# Patient Record
Sex: Female | Born: 1960
Health system: Southern US, Community
[De-identification: ages and names within clinical notes are randomized; demographics above are authoritative.]

## PROBLEM LIST (undated history)

## (undated) DIAGNOSIS — E119 Type 2 diabetes mellitus without complications: Secondary | ICD-10-CM

## (undated) DIAGNOSIS — I10 Essential (primary) hypertension: Secondary | ICD-10-CM

## (undated) DIAGNOSIS — E785 Hyperlipidemia, unspecified: Secondary | ICD-10-CM

## (undated) DIAGNOSIS — Z720 Tobacco use: Secondary | ICD-10-CM

## (undated) DIAGNOSIS — I2510 Atherosclerotic heart disease of native coronary artery without angina pectoris: Secondary | ICD-10-CM

## (undated) DIAGNOSIS — I252 Old myocardial infarction: Secondary | ICD-10-CM

## (undated) HISTORY — DX: Hyperlipidemia, unspecified: E78.5

## (undated) HISTORY — DX: Essential (primary) hypertension: I10

## (undated) HISTORY — DX: Type 2 diabetes mellitus without complications: E11.9

## (undated) HISTORY — PX: ABDOMINAL HYSTERECTOMY: SHX81

## (undated) HISTORY — PX: HERNIA REPAIR: SHX51

## (undated) HISTORY — DX: Tobacco use: Z72.0

---
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
## 2000-07-09 ENCOUNTER — Encounter: Payer: Self-pay | Admitting: Obstetrics

## 2000-07-09 ENCOUNTER — Inpatient Hospital Stay (HOSPITAL_COMMUNITY): Admission: AD | Admit: 2000-07-09 | Discharge: 2000-07-09 | Payer: Self-pay | Admitting: Obstetrics

## 2003-03-13 ENCOUNTER — Emergency Department (HOSPITAL_COMMUNITY): Admission: EM | Admit: 2003-03-13 | Discharge: 2003-03-13 | Payer: Self-pay | Admitting: *Deleted

## 2003-03-13 ENCOUNTER — Encounter: Payer: Self-pay | Admitting: *Deleted

## 2003-09-10 ENCOUNTER — Emergency Department (HOSPITAL_COMMUNITY): Admission: EM | Admit: 2003-09-10 | Discharge: 2003-09-10 | Payer: Self-pay | Admitting: Emergency Medicine

## 2004-03-09 ENCOUNTER — Emergency Department (HOSPITAL_COMMUNITY): Admission: EM | Admit: 2004-03-09 | Discharge: 2004-03-09 | Payer: Self-pay | Admitting: Family Medicine

## 2005-01-15 ENCOUNTER — Ambulatory Visit: Payer: Self-pay | Admitting: Cardiology

## 2005-01-15 ENCOUNTER — Observation Stay (HOSPITAL_COMMUNITY): Admission: EM | Admit: 2005-01-15 | Discharge: 2005-01-16 | Payer: Self-pay | Admitting: Emergency Medicine

## 2005-03-30 ENCOUNTER — Encounter: Payer: Self-pay | Admitting: Family Medicine

## 2005-03-30 ENCOUNTER — Ambulatory Visit: Payer: Self-pay | Admitting: Family Medicine

## 2005-04-08 ENCOUNTER — Encounter: Admission: RE | Admit: 2005-04-08 | Discharge: 2005-04-08 | Payer: Self-pay | Admitting: Family Medicine

## 2005-04-27 ENCOUNTER — Ambulatory Visit: Payer: Self-pay | Admitting: Family Medicine

## 2005-06-01 ENCOUNTER — Ambulatory Visit: Payer: Self-pay | Admitting: Family Medicine

## 2005-06-22 ENCOUNTER — Ambulatory Visit: Payer: Self-pay | Admitting: Family Medicine

## 2006-09-12 ENCOUNTER — Encounter (INDEPENDENT_AMBULATORY_CARE_PROVIDER_SITE_OTHER): Payer: Self-pay | Admitting: *Deleted

## 2006-09-12 LAB — CONVERTED CEMR LAB

## 2006-09-27 ENCOUNTER — Encounter: Payer: Self-pay | Admitting: Family Medicine

## 2006-09-27 ENCOUNTER — Ambulatory Visit: Payer: Self-pay | Admitting: Sports Medicine

## 2006-10-23 ENCOUNTER — Emergency Department (HOSPITAL_COMMUNITY): Admission: EM | Admit: 2006-10-23 | Discharge: 2006-10-24 | Payer: Self-pay | Admitting: Emergency Medicine

## 2006-11-01 ENCOUNTER — Ambulatory Visit: Payer: Self-pay | Admitting: Family Medicine

## 2007-02-09 DIAGNOSIS — Z720 Tobacco use: Secondary | ICD-10-CM | POA: Insufficient documentation

## 2007-02-09 DIAGNOSIS — F17200 Nicotine dependence, unspecified, uncomplicated: Secondary | ICD-10-CM | POA: Insufficient documentation

## 2007-02-09 DIAGNOSIS — L309 Dermatitis, unspecified: Secondary | ICD-10-CM | POA: Insufficient documentation

## 2007-02-09 DIAGNOSIS — N920 Excessive and frequent menstruation with regular cycle: Secondary | ICD-10-CM | POA: Insufficient documentation

## 2007-02-10 ENCOUNTER — Encounter (INDEPENDENT_AMBULATORY_CARE_PROVIDER_SITE_OTHER): Payer: Self-pay | Admitting: *Deleted

## 2007-10-31 ENCOUNTER — Encounter: Payer: Self-pay | Admitting: Family Medicine

## 2007-10-31 ENCOUNTER — Ambulatory Visit: Payer: Self-pay | Admitting: Family Medicine

## 2007-10-31 ENCOUNTER — Encounter (INDEPENDENT_AMBULATORY_CARE_PROVIDER_SITE_OTHER): Payer: Self-pay | Admitting: *Deleted

## 2007-10-31 LAB — CONVERTED CEMR LAB
ALT: <8 units/L (ref 0–35)
Albumin: 3.9 g/dL (ref 3.5–5.2)
Chloride: 109 meq/L (ref 96–112)
Cholesterol: 183 mg/dL (ref 0–200)
Hemoglobin: 12.3 g/dL (ref 12.0–15.0)
MCHC: 30.6 g/dL (ref 30.0–36.0)
Potassium: 4.1 meq/L (ref 3.5–5.3)
RBC: 4.74 M/uL (ref 3.87–5.11)
RDW: 14.6 % (ref 11.5–15.5)
Total Bilirubin: 0.3 mg/dL (ref 0.3–1.2)
Total Protein: 7.4 g/dL (ref 6.0–8.3)
VLDL: 25 mg/dL (ref 0–40)

## 2007-11-14 ENCOUNTER — Encounter: Payer: Self-pay | Admitting: Family Medicine

## 2007-11-14 ENCOUNTER — Ambulatory Visit (HOSPITAL_COMMUNITY): Admission: RE | Admit: 2007-11-14 | Discharge: 2007-11-14 | Payer: Self-pay | Admitting: *Deleted

## 2007-11-16 ENCOUNTER — Encounter (INDEPENDENT_AMBULATORY_CARE_PROVIDER_SITE_OTHER): Payer: Self-pay | Admitting: *Deleted

## 2009-04-24 ENCOUNTER — Ambulatory Visit: Payer: Self-pay | Admitting: Family Medicine

## 2009-04-24 ENCOUNTER — Ambulatory Visit (HOSPITAL_COMMUNITY): Admission: RE | Admit: 2009-04-24 | Discharge: 2009-04-24 | Payer: Self-pay | Admitting: Family Medicine

## 2009-04-24 ENCOUNTER — Encounter: Payer: Self-pay | Admitting: Family Medicine

## 2009-04-25 LAB — CONVERTED CEMR LAB
HCT: 36.8 % (ref 36.0–46.0)
Hemoglobin: 11.9 g/dL — ABNORMAL LOW (ref 12.0–15.0)
MCHC: 32.3 g/dL (ref 30.0–36.0)
RDW: 14.9 % (ref 11.5–15.5)
WBC: 7.5 10*3/uL (ref 4.0–10.5)

## 2009-04-29 ENCOUNTER — Encounter (INDEPENDENT_AMBULATORY_CARE_PROVIDER_SITE_OTHER): Payer: Self-pay | Admitting: Family Medicine

## 2009-04-29 ENCOUNTER — Ambulatory Visit: Payer: Self-pay | Admitting: Family Medicine

## 2009-04-29 DIAGNOSIS — I10 Essential (primary) hypertension: Secondary | ICD-10-CM | POA: Insufficient documentation

## 2009-04-29 DIAGNOSIS — E669 Obesity, unspecified: Secondary | ICD-10-CM | POA: Insufficient documentation

## 2009-04-29 LAB — CONVERTED CEMR LAB
AST: 13 units/L (ref 0–37)
Alkaline Phosphatase: 69 units/L (ref 39–117)
BUN: 9 mg/dL (ref 6–23)
Creatinine, Ser: 0.82 mg/dL (ref 0.40–1.20)
HDL: 43 mg/dL (ref >39)
LDL Cholesterol: 107 mg/dL — ABNORMAL HIGH (ref 0–99)
Potassium: 4.7 meq/L (ref 3.5–5.3)
Sodium: 139 meq/L (ref 135–145)
TSH: 1.595 microintl units/mL (ref 0.350–4.500)
Total CHOL/HDL Ratio: 4.2

## 2009-04-30 ENCOUNTER — Encounter (INDEPENDENT_AMBULATORY_CARE_PROVIDER_SITE_OTHER): Payer: Self-pay | Admitting: Family Medicine

## 2009-05-08 ENCOUNTER — Encounter: Payer: Self-pay | Admitting: Family Medicine

## 2009-11-11 ENCOUNTER — Ambulatory Visit: Payer: Self-pay | Admitting: Family Medicine

## 2009-11-11 DIAGNOSIS — D259 Leiomyoma of uterus, unspecified: Secondary | ICD-10-CM | POA: Insufficient documentation

## 2010-04-29 ENCOUNTER — Ambulatory Visit: Payer: Self-pay | Admitting: Family Medicine

## 2010-04-29 DIAGNOSIS — M549 Dorsalgia, unspecified: Secondary | ICD-10-CM | POA: Insufficient documentation

## 2010-05-04 ENCOUNTER — Emergency Department (HOSPITAL_COMMUNITY): Admission: EM | Admit: 2010-05-04 | Discharge: 2010-05-04 | Payer: Self-pay | Admitting: Emergency Medicine

## 2010-05-04 LAB — CONVERTED CEMR LAB
Leukocytes, UA: NEGATIVE
Nitrite: NEGATIVE
Protein, ur: NEGATIVE mg/dL
Urobilinogen, UA: 0.2
pH: 7

## 2010-06-08 ENCOUNTER — Ambulatory Visit: Payer: Self-pay | Admitting: Family Medicine

## 2010-06-08 LAB — CONVERTED CEMR LAB
Calcium: 9 mg/dL (ref 8.4–10.5)
Chloride: 108 meq/L (ref 96–112)
Creatinine, Ser: 0.82 mg/dL (ref 0.40–1.20)
Glucose, Bld: 80 mg/dL (ref 70–99)
Pap Smear: NEGATIVE
Triglycerides: 108 mg/dL (ref <150)

## 2010-06-11 ENCOUNTER — Encounter: Payer: Self-pay | Admitting: Family Medicine

## 2010-08-06 ENCOUNTER — Ambulatory Visit: Payer: Self-pay | Admitting: Family Medicine

## 2010-09-15 ENCOUNTER — Ambulatory Visit: Payer: Self-pay | Admitting: Family Medicine

## 2010-10-26 ENCOUNTER — Encounter: Payer: Self-pay | Admitting: Family Medicine

## 2010-12-21 ENCOUNTER — Telehealth: Payer: Self-pay | Admitting: *Deleted

## 2010-12-21 ENCOUNTER — Ambulatory Visit: Admit: 2010-12-21 | Payer: Self-pay | Admitting: Family Medicine

## 2010-12-30 ENCOUNTER — Ambulatory Visit: Admission: RE | Admit: 2010-12-30 | Discharge: 2010-12-30 | Payer: Self-pay | Source: Home / Self Care

## 2010-12-30 ENCOUNTER — Telehealth: Payer: Self-pay | Admitting: Family Medicine

## 2010-12-30 DIAGNOSIS — R109 Unspecified abdominal pain: Secondary | ICD-10-CM | POA: Insufficient documentation

## 2011-01-12 NOTE — Assessment & Plan Note (Signed)
Summary: f/u eo   Vital Signs:  Patient profile:   50 year old female Height:      66.25 inches Weight:      206.8 pounds BMI:     33.25 Temp:     98.0 degrees F oral Pulse rate:   87 / minute BP sitting:   122 / 76  (left arm) Cuff size:   regular  Vitals Entered By: Garen Grams LPN (August 06, 2010 3:59 PM) CC: f/u labs Is Patient Diabetic? No Pain Assessment Patient in pain? yes     Location: lower back   Primary Care Provider:  Tinnie Gens MD  CC:  f/u labs.  History of Present Illness: Here today for follow-up.  Complains of back pain.  Reports it is mostly in her right back, under ribs.  Radiates to flank and abdomen.  Worse if she is lying on it or moves suddenly.  She is, otherwise, doing well.  She continues to have heavy cycles.  Her last endometrial biopsy result and ultrasound is reviewed with her.  Her ultrasound is from 2008.  Her fibroid uterus has grown since then.   Habits & Providers  Alcohol-Tobacco-Diet     Tobacco Status: current     Tobacco Counseling: to quit use of tobacco products  Current Problems (verified): 1)  Back Pain  (ICD-724.5) 2)  Intertrigo, Candidal  (ICD-695.89) 3)  Obesity, Unspecified  (ICD-278.00) 4)  Hypertension  (ICD-401.9) 5)  Numbness, Arm  (ICD-782.0) 6)  Well Woman  (ICD-V70.0) 7)  Tobacco Dependence  (ICD-305.1) 8)  Menorrhagia  (ICD-626.2) 9)  Fibroids, Uterus  (ICD-218.9) 10)  Eczema, Atopic Dermatitis  (ICD-691.8)  Current Medications (verified): 1)  Baby Aspirin 81 Mg Chew (Aspirin) .... One Daily 2)  Nystatin 100000 Unit/gm Crea (Nystatin) .... Apply To Affected Areas Three Times A Day Until Rash Improved. Dispense One Tube. 3)  Triamcinolone Acetonide 0.1 % Lotn (Triamcinolone Acetonide) .... Apply To Affected Areas Sparingly Two-Three Times A Day  Allergies (verified): No Known Drug Allergies  Past History:  Past medical, surgical, family and social histories (including risk factors) reviewed, and no  changes noted (except as noted below).  Past Medical History: Reviewed history from 02/09/2007 and no changes required. Fibroid uterus  Past Surgical History: Reviewed history from 04/29/2009 and no changes required. 09/27/06 ldl: 104 hdl: 43 tri: 119 - 10/19/2006,  cesearean section - 1986 Tonsillectomy at age 27yrs hernia repair at c/s scar   Family History: Reviewed history from 04/29/2009 and no changes required. No history of heart attack or stroke per the patient. Mother has HTN Father had DM and died from some type of cancer in his 80s (unknown) one healthy sister other sister died of HIV  Social History: Reviewed history from 04/29/2009 and no changes required. 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 Has a boyfriend. Occasional alcohol but no illicits.   Physical Exam  General:  alert, well-developed, and well-nourished.   Head:  normocephalic and atraumatic.   Neck:  supple.   Abdomen:  soft, non-tender, and normal bowel sounds, large firm mass 2cm above umbilicus and filling entire lower abdomen.     Impression & Recommendations:  Problem # 1:  BACK PAIN (ICD-724.5)  Her updated medication list for this problem includes:    Baby Aspirin 81 Mg Chew (Aspirin) ..... One daily  Orders: [facility]- Est Level  3 (13244)  Problem # 2:  MENORRHAGIA (ICD-626.2)  Orders: [facility]- Est Level  3 (  34742)  Problem # 3:  FIBROIDS, UTERUS (ICD-218.9) To see me in [facility] office for discussion of management options of fibroids.  Appt. 09/15/2010 @ 9:30. Orders: [facility]- Est Level  3 ([id number])  Problem # 4:  HYPERTENSION (ICD-401.9)  Orders: [facility]- Est Level  3 ([id number])  Complete Medication List: 1)  Baby Aspirin 81 Mg Chew (Aspirin) .... One daily 2)  Nystatin 100000 Unit/gm Crea (Nystatin) .... Apply to affected areas three times a day until rash improved. dispense one tube. 3)  Triamcinolone Acetonide 0.1 % Lotn (Triamcinolone acetonide) .... Apply to affected  areas sparingly two-three times a day  Patient Instructions: 1)  Please schedule a follow-up appointment in 6 months .    Prevention & Chronic Care Immunizations   Influenza vaccine: Not documented   Influenza vaccine due: 08/13/2010    Tetanus booster: 04/29/2009: Tdap   Tetanus booster due: 04/30/2019    Pneumococcal vaccine: Not documented   Pneumococcal vaccine deferral: Not indicated  (06/08/2010)   Pneumococcal vaccine due: 05/23/2025  Colorectal Screening   Hemoccult: Not documented    Colonoscopy: Not documented  Other Screening   Pap smear: NEGATIVE FOR INTRAEPITHELIAL LESIONS OR MALIGNANCY.  (06/08/2010)   Pap smear action/deferral: Ordered  (06/08/2010)   Pap smear due: 09/13/2007    Mammogram: Done.  (09/12/2006)   Mammogram action/deferral: Ordered  (06/08/2010)   Mammogram due: 09/13/2007   Smoking status: current  (08/06/2010)   Smoking cessation counseling: YES  (04/29/2010)  Lipids   Total Cholesterol: 179  (06/08/2010)   Lipid panel action/deferral: Lipid Panel ordered   LDL: 112  (06/08/2010)   LDL Direct: Not documented   HDL: 45  (06/08/2010)   Triglycerides: 108  (06/08/2010)  Hypertension   Last Blood Pressure: 122 / 76  (08/06/2010)   Serum creatinine: 0.82  (06/08/2010)   BMP action: Ordered   Serum potassium 4.4  (06/08/2010)    Hypertension flowsheet reviewed?: Yes   Progress toward BP goal: At goal  Self-Management Support :    Hypertension self-management support: Not documented

## 2011-01-12 NOTE — Assessment & Plan Note (Signed)
Summary: bp/eo   Vital Signs:  Patient profile:   50 year old female Height:      66.25 inches Weight:      207 pounds BMI:     33.28 BSA:     2.04 Temp:     98.2 degrees F Pulse rate:   88 / minute BP sitting:   117 / 74  Vitals Entered By: Jone Baseman CMA (Apr 29, 2010 4:08 PM) CC: f/u BP Is Patient Diabetic? No Pain Assessment Patient in pain? yes     Location: right side Intensity: 9 Type: sore   Primary Care Provider:  Tinnie Gens MD  CC:  f/u BP.  History of Present Illness: C/o right sided back pain that radiates to stomach x 3 mos.  Worse with movement.  Better with Aleve.  Goes to leg.  Worse with cycles. Has heavy cycles and h/o fibroid uterus.  Last EMB many years ago. Never anemic when checked.  H/o borderline BP, ok today, lots of salt in diet.   Habits & Providers  Alcohol-Tobacco-Diet     Tobacco Status: current     Tobacco Counseling: to quit use of tobacco products     Cigarette Packs/Day: 0.5  Current Problems (verified): 1)  Intertrigo, Candidal  (ICD-695.89) 2)  Obesity, Unspecified  (ICD-278.00) 3)  Hypertension  (ICD-401.9) 4)  Numbness, Arm  (ICD-782.0) 5)  Well Woman  (ICD-V70.0) 6)  Tobacco Dependence  (ICD-305.1) 7)  Menorrhagia  (ICD-626.2) 8)  Fibroids, Uterus  (ICD-218.9) 9)  Eczema, Atopic Dermatitis  (ICD-691.8)  Current Medications (verified): 1)  Baby Aspirin 81 Mg Chew (Aspirin) .... One Daily 2)  Nystatin 100000 Unit/gm Crea (Nystatin) .... Apply To Affected Areas Three Times A Day Until Rash Improved. Dispense One Tube.  Allergies (verified): No Known Drug Allergies  Past History:  Past Medical History: Last updated: 02/09/2007 Fibroid uterus  Past Surgical History: Last updated: 04/29/2009 09/27/06 ldl: 104 hdl: 43 tri: 119 - 10/19/2006,  cesearean section - 1986 Tonsillectomy at age 36yrs hernia repair at c/s scar   Family History: Last updated: 04/29/2009 No history of heart attack or stroke per the  patient. 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 Has a boyfriend. Occasional alcohol but no illicits.   Risk Factors: Smoking Status: current (04/29/2010) Packs/Day: 0.5 (04/29/2010)  Physical Exam  General:  alert, well-developed, and well-nourished.   Head:  normocephalic and atraumatic.   Neck:  supple.   Lungs:  normal respiratory effort.   Heart:  normal rate.   Abdomen:  soft and non-tender.   Large fibroid uterus noted in lower abdomen.  Firm tumor under umbilicus, but extends above that and approx. 22 wk size.   Impression & Recommendations:  Problem # 1:  OBESITY, UNSPECIFIED (ICD-278.00)  Problem # 2:  TOBACCO DEPENDENCE (ICD-305.1) Discussed cessation, uninterested at this time.  Problem # 3:  MENORRHAGIA (ICD-626.2) Needs second EMB and yearly--plans insurance in September---and probably hysterectomy at that time.  Problem # 4:  FIBROIDS, UTERUS (ICD-218.9) F/u in 1 month to check that size is not changing rapidly---prev. 16 wk size approx. 3 yrs ago---now 22 wk size.  Problem # 5:  BACK PAIN (ICD-724.5) Unclear etiology---? related to fibroids Her updated medication list for this problem includes:    Baby Aspirin 81  Mg Chew (Aspirin) ..... One daily  Complete Medication List: 1)  Baby Aspirin 81 Mg Chew (Aspirin) .... One daily 2)  Nystatin 100000 Unit/gm Crea (Nystatin) .... Apply to affected areas three times a day until rash improved. dispense one tube.  Patient Instructions: 1)  Please schedule a follow-up appointment in 1 month for CPE amd recheck fibroids.  2)  Tobacco is very bad for your health and your loved ones ! You should stop smoking !

## 2011-01-12 NOTE — Assessment & Plan Note (Signed)
Summary: CPE/KH   Vital Signs:  Patient profile:   50 year old female Height:      66.25 inches Weight:      208.4 pounds BMI:     33.50 Temp:     99.1 degrees F oral Pulse rate:   76 / minute BP sitting:   132 / 91  (left arm) Cuff size:   large  Vitals Entered By: Garen Grams LPN (June 08, 2010 9:57 AM) CC: cpe Is Patient Diabetic? No Pain Assessment Patient in pain? no        Primary Care Provider:  Tinnie Gens MD  CC:  cpe.  History of Present Illness: Here for annual exam.  Seen in ED recently with L-S strain.  Had CT essentially negative.  Put on Vicodin and muscle relaxants that helped and pain is gone now.   CT result reviewed and shows progression of fibroid uterus, now 18 x 9.5 x 14.5.  Has heavy menses.  Desires definitive rx. Hypertension follow-up      This is a 50 year old woman who presents for Hypertension follow-up.  The patient complains of rash, but denies lightheadedness, edema, and fatigue.  The patient denies the following associated symptoms: chest pain, dyspnea, palpitations, and leg edema.  Compliance with medications (by patient report) has been near 100%.  The patient reports that dietary compliance has been fair.  The patient reports exercising occasionally.  Adjunctive measures currently used by the patient include salt restriction.    Habits & Providers  Alcohol-Tobacco-Diet     Tobacco Status: current     Tobacco Counseling: to quit use of tobacco products     Cigarette Packs/Day: 0.5  Current Problems (verified): 1)  Back Pain  (ICD-724.5) 2)  Intertrigo, Candidal  (ICD-695.89) 3)  Obesity, Unspecified  (ICD-278.00) 4)  Hypertension  (ICD-401.9) 5)  Numbness, Arm  (ICD-782.0) 6)  Well Woman  (ICD-V70.0) 7)  Tobacco Dependence  (ICD-305.1) 8)  Menorrhagia  (ICD-626.2) 9)  Fibroids, Uterus  (ICD-218.9) 10)  Eczema, Atopic Dermatitis  (ICD-691.8)  Current Medications (verified): 1)  Baby Aspirin 81 Mg Chew (Aspirin) .... One Daily 2)   Nystatin 100000 Unit/gm Crea (Nystatin) .... Apply To Affected Areas Three Times A Day Until Rash Improved. Dispense One Tube.  Allergies (verified): No Known Drug Allergies  Past History:  Past Medical History: Last updated: 02/09/2007 Fibroid uterus  Past Surgical History: Last updated: 04/29/2009 09/27/06 ldl: 104 hdl: 43 tri: 119 - 10/19/2006,  cesearean section - 1986 Tonsillectomy at age 78yrs hernia repair at c/s scar   Family History: Last updated: 04/29/2009 No history of heart attack or stroke per the patient. 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 Occasional alcohol but no illicits.   Risk Factors: Smoking Status: current (06/08/2010) Packs/Day: 0.5 (06/08/2010)  Review of Systems       The patient complains of abnormal bleeding.  The patient denies anorexia, fever, weight loss, weight gain, vision loss, hoarseness, dyspnea on exertion, peripheral edema, prolonged cough, hemoptysis, abdominal pain, melena, hematochezia, severe indigestion/heartburn, incontinence, muscle weakness, suspicious skin lesions, transient blindness, difficulty walking, and breast masses.    Physical Exam  General:  alert, well-developed, and well-nourished.   Head:  normocephalic and atraumatic.   Ears:  R ear normal and  L ear normal.   Mouth:  good dentition.   Neck:  supple, full ROM, no masses, and no thyromegaly.   Breasts:  No mass, nodules, thickening, tenderness, bulging, retraction, inflamation, nipple discharge or skin changes noted.   Lungs:  normal respiratory effort, no intercostal retractions, no accessory muscle use, and normal breath sounds.   Heart:  normal rate, regular rhythm, no murmur, and no gallop.   Abdomen:  soft, non-tender, and normal bowel sounds, large  firm mass 2cm above umbilicus and fillin entire lower abdomen.   Additional Exam:  Procedure: Cervix grasped with single tooth tenaculum, sounded to 8 cm, pipelle passed easily and sample obtained.  Pelvic Exam  Vulva:      normal appearance, normal hair distribution, no lesions or masses.   Urethra and Bladder:      Urethra--normal, no masses, non-tender, no discharge.  Bladder--normal, no masses, non-tender, non-distended.   Vagina:      normal, ruggated, physiologic discharge, no lesions, no masses, no cystocele, adequate pelvic support.   Cervix:      normal, midposition, no CMT, no lesions.   Uterus:      firm, fibroids.  22wk size Adnexa:      normal, no masses bilaterally, mobile bilaterally, nontender bilaterally.      Impression & Recommendations:  Problem # 1:  Preventive Health Care (ICD-V70.0)  Problem # 2:  ECZEMA, ATOPIC DERMATITIS (ICD-691.8)  Her updated medication list for this problem includes:    Triamcinolone Acetonide 0.1 % Lotn (Triamcinolone acetonide) .Marland Kitchen... Apply to affected areas sparingly two-three times a day  Orders: [facility] - Est  40-64 yrs (94854)  Problem # 3:  OBESITY, UNSPECIFIED (ICD-278.00)  Orders: [facility] - Est  40-64 yrs (62703)  Problem # 4:  HYPERTENSION (ICD-401.9)  Orders: T-Basic Metabolic Panel [id number])  Problem # 5:  TOBACCO DEPENDENCE (ICD-305.1) Encouraged to quit smoking. Orders: [facility] - Est  40-64 yrs ([id number])  Problem # 6:  FIBROIDS, UTERUS (ICD-218.9) Will call back when ready to schedule surgical treatment. Orders: [facility] - Est  40-64 yrs ([id number]) Endometrial/Endocerv BX- [facility] (58100)  Problem # 7:  MENORRHAGIA (ICD-626.2)  Orders: [facility] - Est  40-64 yrs (96789) Endometrial/Endocerv BX- [facility] (58100)  Complete Medication List: 1)  Baby Aspirin 81 Mg Chew (Aspirin) .... One daily 2)  Nystatin 100000 Unit/gm Crea (Nystatin) .... Apply to affected areas three times a day until rash improved. dispense one tube. 3)   Triamcinolone Acetonide 0.1 % Lotn (Triamcinolone acetonide) .... Apply to affected areas sparingly two-three times a day  Other Orders: Pap Smear- [facility] (Pap) Mammogram (Screening) (Mammo) T-Lipid Profile ([id number])   Prevention & Chronic Care Immunizations   Influenza vaccine: Not documented   Influenza vaccine due: 08/13/2010    Tetanus booster: 04/29/2009: Tdap   Tetanus booster due: 04/30/2019    Pneumococcal vaccine: Not documented   Pneumococcal vaccine deferral: Not indicated  (06/08/2010)   Pneumococcal vaccine due: 05/23/2025  Colorectal Screening   Hemoccult: Not documented    Colonoscopy: Not documented  Other Screening   Pap smear: Done.  (09/12/2006)   Pap smear action/deferral: Ordered  (06/08/2010)   Pap smear due: 09/13/2007    Mammogram: Done.  (09/12/2006)   Mammogram action/deferral: Ordered  (06/08/2010)   Mammogram due: 09/13/2007   Smoking status: current  (06/08/2010)   Smoking cessation counseling: YES  (04/29/2010)  Lipids   Total Cholesterol: 180  (04/29/2009)   Lipid panel action/deferral: Lipid Panel ordered   LDL: 107  (04/29/2009)  LDL Direct: Not documented   HDL: 43  (04/29/2009)   Triglycerides: 148  (04/29/2009)  Hypertension   Last Blood Pressure: 132 / 91  (06/08/2010)   Serum creatinine: 0.82  (04/29/2009)   BMP action: Ordered   Serum potassium 4.7  (04/29/2009)    Hypertension flowsheet reviewed?: Yes   Progress toward BP goal: At goal  Self-Management Support :    Hypertension self-management support: Not documented   Nursing Instructions: Pap smear today Schedule screening mammogram (see order)   Patient Instructions: 1)  Please schedule a follow-up appointment in 6 months .  Prescriptions: TRIAMCINOLONE ACETONIDE 0.1 % LOTN (TRIAMCINOLONE ACETONIDE) apply to affected areas sparingly two-three times a day  #45gm x 3   Entered and Authorized by:   Tinnie Gens MD   Signed by:   Tinnie Gens MD on  06/08/2010   Method used:   Electronically to        [facility] (retail)       [address]       , [place]  [address]       Ph: [phone]       Fax: [phone]   RxID:   [id number]

## 2011-01-12 NOTE — Miscellaneous (Signed)
   Clinical Lists Changes  Problems: Removed problem of INTERTRIGO, CANDIDAL (ICD-695.89) Removed problem of NUMBNESS, ARM (ICD-782.0) Removed problem of WELL WOMAN (ICD-V70.0)

## 2011-01-12 NOTE — Letter (Signed)
Summary: Results Follow-up Letter  All     ,     Phone:   Fax:     06/11/2010  AWANDA WILCOCK [address] [place], [place]  [address]  Dear Ms. Enterline,   The following are the results of your recent test(s):  Test     Result     Pap Smear    Normal___X____  Not Normal_____       Comments: _________________________________________________________ Cholesterol  179 _________________________________________________________ Other Tests: Kidney function is normal. Electrolytes are normal. Endometrial biopsy reveals a polyp (benign growth). ________________________________________________________   Sincerely,    Tinnie Gens MD

## 2011-01-14 NOTE — Progress Notes (Signed)
Summary: Phn Msg   Phone Note Call from Patient Call back at Home Phone [phone] Call back at [phone]   Reason for Call: Talk to Doctor Summary of Call: req call from Dr. Shawnie Pons, wants to speak with her about not making todays appt, trying to see if Dr. Shawnie Pons can squeeze her in at another time.  Initial call taken by: Knox Royalty,  December 21, 2010 12:21 PM  Follow-up for Phone Call        pt called back, scheduled for 1/18 but still wants Dr. Shawnie Pons to call her.  Follow-up by: Knox Royalty,  December 21, 2010 1:41 PM  Additional Follow-up for Phone Call Additional follow up Details #1::        Spoke with patient, she states she will wait till her appt to speak with MD. Additional Follow-up by: Garen Grams LPN,  December 21, 2010 4:42 PM

## 2011-01-14 NOTE — Progress Notes (Signed)
Summary: pls call   Phone Note Call from Patient Call back at Home Phone [phone]   Caller: Patient Summary of Call: needs to talk to [facility] about calling the pharmacy/insurance Initial call taken by: De Nurse,  December 30, 2010 12:11 PM  Follow-up for Phone Call        Pt. called back--Will have Belenda Cruise at [facility] call her. Follow-up by: Tinnie Gens MD,  December 30, 2010 12:35 PM

## 2011-01-14 NOTE — Assessment & Plan Note (Signed)
Summary: discuss issues/eo   Vital Signs:  Patient profile:   50 year old female Height:      66.25 inches Weight:      212 pounds Temp:     98.8 degrees F oral Pulse rate:   90 / minute Pulse rhythm:   regular BP sitting:   126 / 84  (left arm) Cuff size:   large  Vitals Entered By: Loralee Pacas CMA (December 30, 2010 10:44 AM) Is Patient Diabetic? No Pain Assessment Patient in pain? yes     Location: abdomen Intensity: 4 Type: aching Onset of pain  With activity Comments ?surgery, pt states that she has tumors(cysts), and vaginal bleeding   Primary Care Provider:  Tinnie Gens MD   History of Present Illness: Here to discuss surgery.  Last seen at [facility] and was going to have Depo Lupron but has not gotten that---Was waiting for pharmacy to call her about co-pay---She will call today-- Complains of worsening pain with sleeping and desire for definitive rx.  Habits & Providers  Alcohol-Tobacco-Diet     Tobacco Status: current     Tobacco Counseling: to quit use of tobacco products     Cigarette Packs/Day: 0.5  Exercise-Depression-Behavior     Have you felt down or hopeless? no     Have you felt little pleasure in things? no     Depression Counseling: not indicated; screening negative for depression     Seat Belt Use: always  Current Problems (verified): 1)  Back Pain  (ICD-724.5) 2)  Obesity, Unspecified  (ICD-278.00) 3)  Hypertension  (ICD-401.9) 4)  Tobacco Dependence  (ICD-305.1) 5)  Menorrhagia  (ICD-626.2) 6)  Fibroids, Uterus  (ICD-218.9) 7)  Eczema, Atopic Dermatitis  (ICD-691.8)  Current Medications (verified): 1)  Baby Aspirin 81 Mg Chew (Aspirin) .... One Daily 2)  Nystatin 100000 Unit/gm Crea (Nystatin) .... Apply To Affected Areas Three Times A Day Until Rash Improved. Dispense One Tube. 3)  Triamcinolone Acetonide 0.1 % Lotn (Triamcinolone Acetonide) .... Apply To Affected Areas Sparingly Two-Three Times A Day  Allergies (verified): No  Known Drug Allergies  Past History:  Past Medical History: Last updated: 02/09/2007 Fibroid uterus  Past Surgical History: Last updated: 04/29/2009 09/27/06 ldl: 104 hdl: 43 tri: 119 - 10/19/2006,  cesearean section - 1986 Tonsillectomy at age 50yrs hernia repair at c/s scar   Family History: Last updated: 04/29/2009 No history of heart attack or stroke per the patient. 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 Has a boyfriend. Occasional alcohol but no illicits.   Risk Factors: Smoking Status: current (12/30/2010) Packs/Day: 0.5 (12/30/2010)  Social History: Seat Belt Use:  always  Review of Systems       The patient complains of abdominal pain.  The patient denies anorexia, chest pain, syncope, dyspnea on exertion, peripheral edema, prolonged cough, headaches, and severe indigestion/heartburn.    Physical Exam  General:  alert.   Head:  normocephalic and atraumatic.   Neck:  supple.   Lungs:  normal respiratory effort.   Heart:  normal rate.   Abdomen:  soft, non-tender, and normal bowel sounds, large firm mass 2cm above umbilicus and filling entire lower abdomen.     Impression & Recommendations:  Problem # 1:  HYPERTENSION (ICD-401.9) BP good on no meds.  Problem # 2:  TOBACCO DEPENDENCE (ICD-305.1)  Problem # 3:  FIBROIDS, UTERUS (ICD-218.9) To call for Depo Lupron today---Schedule surgery in 2 months.---Booked with [place]. Orders: [facility]- Est Level  3 (16109)  Problem # 4:  MENORRHAGIA (ICD-626.2)  Orders: [facility]- Est Level  3 (60454)  Problem # 5:  PELVIC  PAIN (ICD-789.09)  The following medications were removed from the medication list:    Baby Aspirin 81 Mg Chew (Aspirin) ..... One daily  Orders: [facility]- Est Level  3 (09811)  Patient Instructions: 1)  Please  schedule a follow-up appointment in 2 months.  2)  Tobacco is very bad for your health and your loved ones ! You should stop smoking !    Prevention & Chronic Care Immunizations   Influenza vaccine: Not documented   Influenza vaccine deferral: Refused  (12/30/2010)   Influenza vaccine due: 08/13/2010    Tetanus booster: 04/29/2009: Tdap   Tetanus booster due: 04/30/2019    Pneumococcal vaccine: Not documented   Pneumococcal vaccine deferral: Not indicated  (06/08/2010)   Pneumococcal vaccine due: 05/23/2025  Colorectal Screening   Hemoccult: Not documented    Colonoscopy: Not documented   Colonoscopy action/deferral: Refused  (12/30/2010)  Other Screening   Pap smear: NEGATIVE FOR INTRAEPITHELIAL LESIONS OR MALIGNANCY.  (06/08/2010)   Pap smear action/deferral: Ordered  (06/08/2010)   Pap smear due: 09/13/2007    Mammogram: Done.  (09/12/2006)   Mammogram action/deferral: Ordered  (06/08/2010)   Mammogram due: 09/13/2007   Smoking status: current  (12/30/2010)   Smoking cessation counseling: YES  (12/30/2010)  Lipids   Total Cholesterol: 179  (06/08/2010)   Lipid panel action/deferral: Lipid Panel ordered   LDL: 112  (06/08/2010)   LDL Direct: Not documented   HDL: 45  (06/08/2010)   Triglycerides: 108  (06/08/2010)  Hypertension   Last Blood Pressure: 126 / 84  (12/30/2010)   Serum creatinine: 0.82  (06/08/2010)   BMP action: Ordered   Serum potassium 4.4  (06/08/2010)    Hypertension flowsheet reviewed?: Yes   Progress toward BP goal: At goal  Self-Management Support :    Hypertension self-management support: Not documented   Orders Added: 1)  [facility]- Est Level  3 [91478]

## 2011-01-15 ENCOUNTER — Ambulatory Visit: Payer: Self-pay

## 2011-01-15 DIAGNOSIS — D259 Leiomyoma of uterus, unspecified: Secondary | ICD-10-CM

## 2011-02-02 ENCOUNTER — Encounter (HOSPITAL_COMMUNITY)
Admission: RE | Admit: 2011-02-02 | Discharge: 2011-02-02 | Disposition: A | Discharge status: Home / Self Care | Payer: BC Managed Care – PPO | Source: Ambulatory Visit | Attending: Family Medicine | Admitting: Family Medicine

## 2011-02-02 DIAGNOSIS — Z01812 Encounter for preprocedural laboratory examination: Secondary | ICD-10-CM | POA: Insufficient documentation

## 2011-02-02 LAB — CBC
HCT: 37.4 % (ref 36.0–46.0)
MCHC: 31.6 g/dL (ref 30.0–36.0)
MCV: 83.5 fL (ref 78.0–100.0)
RBC: 4.48 MIL/uL (ref 3.87–5.11)
RDW: 14.5 % (ref 11.5–15.5)

## 2011-02-15 ENCOUNTER — Other Ambulatory Visit: Payer: Self-pay | Admitting: Family Medicine

## 2011-02-15 ENCOUNTER — Inpatient Hospital Stay (HOSPITAL_COMMUNITY)
Admission: RE | Admit: 2011-02-15 | Discharge: 2011-02-18 | DRG: 359 | Disposition: A | Discharge status: Home / Self Care | Payer: BC Managed Care – PPO | Source: Ambulatory Visit | Attending: Family Medicine | Admitting: Family Medicine

## 2011-02-15 DIAGNOSIS — K429 Umbilical hernia without obstruction or gangrene: Secondary | ICD-10-CM | POA: Diagnosis present

## 2011-02-15 DIAGNOSIS — N920 Excessive and frequent menstruation with regular cycle: Secondary | ICD-10-CM | POA: Diagnosis present

## 2011-02-15 DIAGNOSIS — D250 Submucous leiomyoma of uterus: Secondary | ICD-10-CM

## 2011-02-15 DIAGNOSIS — D251 Intramural leiomyoma of uterus: Secondary | ICD-10-CM | POA: Diagnosis present

## 2011-02-15 DIAGNOSIS — D252 Subserosal leiomyoma of uterus: Secondary | ICD-10-CM | POA: Diagnosis present

## 2011-02-15 DIAGNOSIS — R109 Unspecified abdominal pain: Secondary | ICD-10-CM | POA: Diagnosis present

## 2011-02-15 LAB — PREGNANCY, URINE: Preg Test, Ur: NEGATIVE

## 2011-02-16 LAB — CBC
HCT: 39.6 % (ref 36.0–46.0)
MCH: 26.3 pg (ref 26.0–34.0)
MCV: 83.2 fL (ref 78.0–100.0)
Platelets: 247 10*3/uL (ref 150–400)
RBC: 4.76 MIL/uL (ref 3.87–5.11)

## 2011-02-19 NOTE — Op Note (Signed)
Rebecca Cuevas, Rebecca Cuevas            ACCOUNT NO.:  [id number]  MEDICAL RECORD NO.:  [id number]           PATIENT TYPE:  I  LOCATION:  9318                          FACILITY:  [facility]  PHYSICIAN:  Laporshia Hogen S. Shawnie Pons, M.D.   DATE OF BIRTH:  1961/02/10  DATE OF PROCEDURE:  02/15/2011 DATE OF DISCHARGE:                              OPERATIVE REPORT   PREOPERATIVE DIAGNOSES: 1. Fibroid uterus. 2. Abdominal pain. 3. Menorrhagia. 4. Umbilical hernia.  POSTOPERATIVE DIAGNOSES: 1. Fibroid uterus. 2. Abdominal pain. 3. Menorrhagia. 4. Umbilical hernia.  PROCEDURES: 1. Exploratory Laparotomy 2. Supracervical hysterectomy. 3. Umbilical hernia repair.  SURGEON:  Shelbie Proctor. Shawnie Pons, MD.  ASSISTANT:  Horton Chin, MD  ANESTHESIA:  General local, Angelica Pou, MD  FINDINGS:  Large multilobed fibroid uterus.  SPECIMEN:  Uterus to Path.  ESTIMATED BLOOD LOSS:  200 mL.  COMPLICATIONS:  None known.  REASON FOR PROCEDURE:  Briefly, the patient is a 50 year old para 1 who has had 1 prior C-section who has enlarged fibroid uterus, who desires definitive treatment.  She continues to have lower abdominal pain and flank pain related to this as well as heavy cycles.  She was not a candidate for hormonal manipulation as she is a smoker, over 35.  She has a long history of abnormal uterine bleeding, so she desired definitive therapy.  DESCRIPTION OF PROCEDURE:  The patient taken to OR.  She was placed in supine position.  She was prepped draped in usual sterile fashion.  A time-out was performed.  SCDs were placed, 1 gram of Ancef had been hung.  A Foley catheter was placed inside her bladder.  A knife was used to make a vertical incision through an old scar from the umbilicus to the pubic bone.  After the initial incision, this was marked with a marking pen.  This incision was carried down to underlying fascia which was divided using a tonsil and the electrocautery.  The diastasis  recti was immediately apparent.  The rectus did not have to be divided.  The peritoneum was entered sharply.  The peritoneum was taken down with the electrocautery.  At the superior edge of  the incision, there was a  previous repairof an umbilical hernia and adhesions of the omentum to the  anterior abdominal wall.  Additionally, the defect was noted to be to the patient's right and superior to the umbilicus.  The uterus was immediately encountered, felt to be multilobed and had to be brought up and out of the incision with some care.  The patient's right round ligament was identified, tagged, suture ligated, and taken with the electrocautery.  The anterior leaf of the broad was taken at this point. A Kelly clamp was placed across the remaining round on the uterus.  The tubo-ovarian pedicle was then taken with Kelly clamp with 1 clamp being left on the uterus.  And a free tie plus suture ligature was performed here.  The bladder flap was extended on this side, the bladder taken down.  Uterine artery was skeletonized and clamped with a Heaney clamp. Straight clamp was used for back bleeding and the uterine artery  was suture ligated.  A second bite was done here to ensure that all of the artery had been taken.  Attention was turned to the patient's left side where the round ligament was visualized, suture ligated, and then cut with the electrocautery.  The anterior leaf of the broad was taken down to form the bladder flap.  The tubo-ovarian pedicle was then taken with a Kelly clamp with a free tie followed by suture ligature to ensure hemostasis.  The bowel was then packed away.  The uterine artery was skeletonized on this side, was clamped with a Heaney clamp, and suture ligated.  A second bite was taken on this side as well.  At this point, the cervix was noted to be approximately 5 cm in length and down to a very deep pelvis, and it was felt the patient would best be served by  removal  of the uterus only.  Once this was done, hemostasis was obtained.   The patient had no history of abnormal Pap, and so a decision was made  to stop at the supracervical hysterectomy rather than risk further bleeding and dissection down to deep pelvis to get out a normal-appearing cervix. The cervix was closed with 0 Vicryl suture in a locked running fashion. Hemostasis was fairly good.  There was some bleeding from the posterior peritoneum on the backside of the cervix and this was closed with figure- of-eight suture.  There was little bit bleeding where the bladder flap was created and this was cauterized with electrocautery.  Irrigation was done inside the abdomen.  The cervical stump appeared hemostatic.  The tubo-ovarian pedicles also appeared hemostatic.  Packs removed from the abdomen.  Attention then turned to the umbilical hernia, which was grasped with Kocher clamp and #1 Prolene in a figure-of-eight followed by interrupted was used to close 2 fascial defects and to hopefully get rid of her supraumbilical hernia there.  The fascia was then closed with a looped PDS.  Where the omentum was stuck to the anterior fascia, the fascia was grasped there and closed.  Bulk closure was done the rest of the way down to approximately two-thirds of the incision.  Another looped PDS came out from the bottom including fascia only and these 2 sutures were tied together.  The subcutaneous tissue was hemostatic and the skin was closed using clips.  20 mL of 0.25% Marcaine were injected about this incision.  All instrument, lap counts were correct x2.  The patient was awakened and taken to recovery in stable condition.     Shelbie Proctor. Shawnie Pons, M.D.     TSP/MEDQ  D:  02/15/2011  T:  02/16/2011  Job:  [id number]  Electronically Signed by Tinnie Gens M.D. on 02/18/2011 10:42:41 AM

## 2011-02-22 NOTE — Discharge Summary (Signed)
NAMEMILLI, WOOLRIDGE            ACCOUNT NO.:  [id number]  MEDICAL RECORD NO.:  [id number]           PATIENT TYPE:  I  LOCATION:  9318                          [facility]ACILITY:  [facility]  PHYSICIAN:  Karem Tomaso S. Shawnie Pons, M.D.   DATE OF BIRTH:  Mar 21, 1961  DATE OF ADMISSION:  02/15/2011 DATE OF DISCHARGE:  02/18/2011                              DISCHARGE SUMMARY   FINAL DIAGNOSES: 1. Large fibroid uterus. 2. Menorrhagia. 3. Pelvic pain. 4. Umbilical hernia. 5. Postoperative hypertension. 6. Tobacco use. 7. Chronic back pain. 8. Obesity.  PERTINENT LABORATORY DATA:  Preoperative hemoglobin of 11.8, postoperative hemoglobin of 12.5.  Urine pregnancy test was negative. MRSA screen was negative.  OTHER PERTINENT FINDINGS:  Large 18 weeks size fibroid uterus and umbilical hernia.  REASON FOR ADMISSION:  Briefly, please see H and P in the chart.  The patient is a 50 year old para 1 who has undergone one previous cesarean delivery, who has a large symptomatic fibroid uterus that she elected for definitive treatment.  Prior to procedure, she received a shot of Depo-Lupron approximately 1 month which probably decreased the volume of her uterus by one third.  PROCEDURES:  The patient underwent an exploratory laparotomy, supracervical hysterectomy, and umbilical hernia repair.  HOSPITAL COURSE:  The patient was admitted on February 15, 2011, and underwent the above procedure without complication.  Postoperatively, she was transferred to the floor.  Her postoperative course was complicated by hypertension and she was started on hydrochlorothiazide on postoperative day #1.  She was on a PCA as well as IV fluids.  Her blood pressure remained high, and got as high as 180/102 at the end of postoperative day #1.  She received Norvasc 5 mg, which brought her pressure down to the 150/80 range.  Both of these medications were continued throughout her hospital course and at the time of discharge, her blood  pressure was down to the 100s-120s/60s-70s.  The patient is a long-time patient of mine in the [facility]  She has no known history of hypertension and she refuses to take medication on an outpatient basis.  The patient's PCA was turned down to reduced dose on postoperative day #1 and was turned off on postop day #2.  The patient had a mild postoperative ileus with no passage of flatus until the end of postoperative day #2, but by postop day #3, she was feeling well, her stomach was less distended, she was ambulating, voiding and eating without difficulty and without nausea.  She remained afebrile throughout the hospital course and as stated previously her blood pressure was down nicely and it was felt she was stable for discharge on postop day #3.  DISCHARGE DISPOSITION AND CONDITION:  The patient was discharged home in good condition.  Follow up will be in the [facility] for staple removal on February 22, 2011 at 11 a.m. and her postoperative visit with myself on March 03, 2011 at 11 p.m.  DISCHARGE MEDICATIONS:  Percocet 3/325 one to two p.o. q.4-6 h. p.r.n. pain, #48 given.  She will resume Naprosyn 500 mg b.i.d. as needed for other types of pain.  At this  point, we are going to not discharge her home with antihypertensives and we will do a blood pressure check when she comes to the office on Monday.  If her blood pressure remains elevated, we will have another discussion about whether to resume these medications.     Shelbie Proctor. Shawnie Pons, M.D.     TSP/MEDQ  D:  02/18/2011  T:  02/18/2011  Job:  [id number]  Electronically Signed by Tinnie Gens M.D. on 02/22/2011 02:18:34 PM

## 2011-03-01 LAB — POCT I-STAT, CHEM 8
BUN: 9 mg/dL (ref 6–23)
Calcium, Ion: 1.12 mmol/L (ref 1.12–1.32)
Chloride: 108 mEq/L (ref 96–112)
Creatinine, Ser: 0.7 mg/dL (ref 0.4–1.2)
Glucose, Bld: 99 mg/dL (ref 70–99)
Hemoglobin: 12.9 g/dL (ref 12.0–15.0)
Potassium: 4.3 mEq/L (ref 3.5–5.1)

## 2011-03-01 LAB — URINALYSIS, ROUTINE W REFLEX MICROSCOPIC
Bilirubin Urine: NEGATIVE
Ketones, ur: NEGATIVE mg/dL
Protein, ur: NEGATIVE mg/dL
Urobilinogen, UA: 0.2 mg/dL (ref 0.0–1.0)
pH: 7 (ref 5.0–8.0)

## 2011-03-01 LAB — CBC
Hemoglobin: 12.2 g/dL (ref 12.0–15.0)
MCV: 85.5 fL (ref 78.0–100.0)
Platelets: 212 10*3/uL (ref 150–400)
RDW: 15.4 % (ref 11.5–15.5)

## 2011-03-01 LAB — URINE MICROSCOPIC-ADD ON

## 2011-03-01 LAB — DIFFERENTIAL
Basophils Relative: 0 % (ref 0–1)
Eosinophils Relative: 2 % (ref 0–5)
Neutro Abs: 4.5 10*3/uL (ref 1.7–7.7)
Neutrophils Relative %: 62 % (ref 43–77)

## 2011-03-03 ENCOUNTER — Ambulatory Visit: Payer: BC Managed Care – PPO | Admitting: Obstetrics & Gynecology

## 2011-03-03 DIAGNOSIS — Z09 Encounter for follow-up examination after completed treatment for conditions other than malignant neoplasm: Secondary | ICD-10-CM

## 2011-03-18 NOTE — Assessment & Plan Note (Signed)
NAME:  Rebecca Cuevas, LEVENGOOD NO.:  [id number]  MEDICAL RECORD NO.:  [id number]           PATIENT TYPE:  LOCATION:  [facility] at [facility]           FACILITY:  PHYSICIAN:  Tinnie Gens, MD        DATE OF BIRTH:  03/06/1961  DATE OF SERVICE:                                 CLINIC NOTE  DATE OF SERVICE:  March 03, 2011.  CHIEF COMPLAINT:  Postop check.  HISTORY OF PRESENT ILLNESS:  The patient is a 50 year old para 1 who is status post TAH for a large fibroid uterus and umbilical hernia repair. The patient has had her staples removed.  Steri-Strips are in place. During her hospitalization, she had a very markedly increased blood pressure.  She went home on no medications.  Her blood pressures have been good the three times they have been checked here in the office.  Pathology is reviewed today, which shows atrophic endometrium consistent with Depo-Lupron shot, leiomyomata, and normal uterine serosa.  The patient was given results of pathology.  Her incision was well healed according to the patient.  PHYSICAL EXAMINATION:  VITAL SIGNS:  On exam today, vitals are as noted in the chart.  She is a well-developed, well-nourished woman in no acute distress. ABDOMEN:  Soft, nontender, nondistended.  The incision is healing well. Steri-Strips remain in place.  IMPRESSION:  Status post transabdominal supracervical hysterectomy for fibroid uterus, with benign pathology.  PLAN:  Follow up in 4 weeks for final postop check.          ______________________________ Tinnie Gens, MD    TP/MEDQ  D:  03/03/2011  T:  03/04/2011  Job:  [id number]

## 2011-04-01 ENCOUNTER — Ambulatory Visit: Payer: BC Managed Care – PPO | Admitting: Obstetrics & Gynecology

## 2011-04-01 DIAGNOSIS — Z09 Encounter for follow-up examination after completed treatment for conditions other than malignant neoplasm: Secondary | ICD-10-CM

## 2011-04-05 NOTE — Assessment & Plan Note (Signed)
NAME:  Rebecca Cuevas, Rebecca Cuevas NO.:  [id number]  MEDICAL RECORD NO.:  [id number]           PATIENT TYPE:  LOCATION:  [facility] at [facility]           FACILITY:  PHYSICIAN:  Tinnie Gens, MD        DATE OF BIRTH:  [birth year]  DATE OF SERVICE:  04/01/2011                                 CLINIC NOTE  CHIEF COMPLAINT:  Postop check.  HISTORY OF PRESENT ILLNESS:  The patient is a 49 year old para 1 who is status post TAH for large fibroid use, umbilical hernia repair.  She looks good.  She reports no significant pain.  She has resumed most normal activities.  She has not gone back to work.  PHYSICAL EXAMINATION:  VITAL SIGNS:  Her vitals are as noted in the chart. GENERAL:  She is a well-developed and well-nourished female in no acute distress. ABDOMEN:  Soft, nontender, and nondistended.  Incision is well healed. Abdomen is nontender.  IMPRESSION:  Status post transabdominal supracervical hysterectomy for fibroid uterus with benign pathology.  PLAN:  The patient may return to full duty at work.          ______________________________ Tinnie Gens, MD    TP/MEDQ  D:  04/01/2011  T:  04/01/2011  Job:  [id number]

## 2011-04-27 NOTE — Assessment & Plan Note (Signed)
NAME:  Rebecca Cuevas, Rebecca Cuevas NO.:  [id number]   MEDICAL RECORD NO.:  [id number]          PATIENT TYPE:  POB   LOCATION:  [facility] at [facility]         FACILITY:  [facility]   PHYSICIAN:  Tinnie Gens, MD        DATE OF BIRTH:  [birth year]   DATE OF SERVICE:  09/15/2010                                  CLINIC NOTE   CHIEF COMPLAINT:  Abnormal uterine bleeding and fibroid uterus.   HISTORY OF PRESENT ILLNESS:  The patient is a 50 year old para 1 who  comes in today for abnormal uterine bleeding.  She is referred over from  [facility].  She has a long history of  menorrhagia.  She has undergone endometrial sampling x2 which have both  been negative.  The patient also had an ultrasound that revealed fibroid  uterus.  She desires more definitive treatment.   PAST MEDICAL HISTORY:  Significant for obesity and hypertension.   PAST SURGICAL HISTORY:  Tonsillectomy, C-section, and hernia repair.   MEDICATIONS:  She is on aspirin 81 mg p.o. daily.   ALLERGIES:  None known.   OBSTETRICAL HISTORY:  She is a para 1 with 1 cesarean section.   GYNECOLOGIC HISTORY:  Last Pap was normal on June 08, 2010, as was  endometrial sampling.   FAMILY HISTORY:  Significant for hypertension in her mom, diabetes in  her father.   SOCIAL HISTORY:  The patient works as a Lawyer.  She does smoke  approximately 1 pack per day.  She lives alone.  She has a boyfriend,  occasional alcohol but no illicit drug use.   PHYSICAL EXAMINATION:  Today:  VITAL SIGNS:  As noted in the chart.  Blood pressure is 132/93, weight  is 210.  GENERAL:  She is a well-developed, well-nourished female in no acute  distress.  ABDOMEN:  Soft, nontender, nondistended.  She has a large mass in the  lower abdomen that is 2 fingerbreadths above the umbilicus and extends  laterally to most of the lower abdomen.  Incisions on her abdomen are  well healed.  GU:  Normal external female genitalia.  BUS is  normal.  Vagina is pink,  well rugated.  Cervix is nulliparous without lesion.  Uterus is large,  bulky and firm.  The adnexa cannot be adequately determined based on  this exam.   Additional labs reviewed include last Pap, last endometrial sample and  ultrasound today.   IMPRESSION:  1. Large fibroid uterus.  2. Menorrhagia.  3. Chronic back pain, probably related to large fibroid uterus.  4. Tobacco use.  5. Obesity.   PLAN:  After a lengthy discussion about risks and benefits of surgery,  it was decided that we might be best served by trying to shrink this  fibroid uterus before proceeding with hysterectomy.  She may still  require abdominal hysterectomy, but the thought was that maybe we would  have less blood loss and less chance of damage to surrounding  structures.  We will contact her insurance company about one-time dose  of Depo Lupron.  Follow up surgery in about 3 months.           ______________________________  Tinnie Gens, MD     TP/MEDQ  D:  09/15/2010  T:  09/16/2010  Job:  [id number]

## 2011-04-30 NOTE — Consult Note (Signed)
NAMEMALCOLM, Cuevas NO.:  [id number]   MEDICAL RECORD NO.:  [id number]          PATIENT TYPE:  EMS   LOCATION:  [facility]                         FACILITY:  [facility]   PHYSICIAN:  Ponce Bing, M.D.  DATE OF BIRTH:  18-Mar-1961   DATE OF CONSULTATION:  01/15/2005  DATE OF DISCHARGE:                                   CONSULTATION   REFERRING PHYSICIAN:  Lorre Nick, M.D.   PRIMARY CARE PHYSICIAN:  None.   HISTORY OF PRESENT ILLNESS:  A 50 year old woman with excellent general  health presents with chest discomfort.   HISTORY OF PRESENT ILLNESS:  Ms. Rebecca Cuevas has few cardiovascular risk  factors.  She does have an approximately 15-pack-year history of cigarette  smoking with continued consumption of somewhat less than one half pack per  day.  She has not had hypertension or diabetes.  She is unaware of any  vascular disease.  Her lipid status is unknown.   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  This was mild to  moderate in intensity and lasted a few minutes.  There was some radiation  towards the left shoulder but no associated symptoms.  The discomfort  resolved with rest.  There was some light-headedness, which lasted a matter  of minutes.  Eventually, after a modest period of rest, she felt as if she  was back to normal.  However, after returning home, she had an additional  less severe episode prompting her to present to the emergency department.   Initial workup has been unremarkable with negative cardiac markers and a  normal EKG.   PAST MEDICAL HISTORY:  Benign.  She underwent a C section with her only  pregnancy and has had a hernia repair.  She has no chronic medical  illnesses, takes no medications routinely, and reports no allergies.   SOCIAL HISTORY:  Lives in [place].  Occupation as above.  Family as  above.  Denies significant use of alcohol or any use  of drugs.   FAMILY HISTORY:  Negative for coronary disease.   REVIEW OF SYSTEMS:  Notable for a remote diagnosis of ulcer, apparently made  clinically.  She has no abdominal pain nor change in bowel habit at the  present time.  Otherwise, all systems were reviewed and were negative.  She  has had one pregnancy and no abortions.  She reports a heavy menstrual flow  with continuing regular periods.   PHYSICAL EXAMINATION:  GENERAL:  A pleasant, somewhat overweight, well-  appearing woman.  VITAL SIGNS:  The temperature is 98.1, blood pressure 120/80, heart rate 80  and regular, respirations 20.  O2 saturation 97%.  HEENT:  Normal EOMs; normal lids and conjunctivae.  NECK:  No jugular venous distention; normal carotid upstrokes without  bruits.  ENDOCRINE:  No thyromegaly.  LUNGS:  Clear.  CARDIAC:  Normal first and second heart sounds; fourth heart sound present;  modest systolic murmur at the cardiac base.  ABDOMEN:  Soft and nontender, no masses; no organomegaly; normal bowel  sounds without bruits.  EXTREMITIES:  Normal distal pulses; no edema.  NEUROMUSCULAR:  Symmetric strength and tone; normal cranial nerves.  MUSCULOSKELETAL:  No joint deformity.  PSYCHIATRIC:  Normal affect.   LABORATORY DATA:  Chest x-ray:  Within normal limits.  EKG:  Normal sinus  rhythm; within normal limits.   Normal laboratory studies include a chemistry profile and a D-dimer and  lipase level.  She did have a mild normocytic anemia with a hemoglobin of  11.   IMPRESSION:  Ms. [name] presents with atypical chest discomfort in the  pattern of its occurrence, but the quality of the pain is consistent with  ischemia.  Her cardiovascular risk is extremely low . She will be observed  overnight with serial cardiac markers and EKGs.  If symptoms resolve,  discharge tomorrow is anticipated with an outpatient stress test.  A urine  drug screen will be obtained to exclude use of cocaine.  A lipid profile  and  TSH level are also pending.   She has mild anemia.  This could be related to excessive gynecologic blood  loss.  An iron and iron binding level will be obtained as well as stool for  hemoccult testing if available.  She already plans to see a gynecologist for  follow-up of these issues.      RR/MEDQ  D:  01/16/2005  T:  01/16/2005  Job:  [id number]

## 2011-08-23 ENCOUNTER — Encounter: Payer: Self-pay | Admitting: Family Medicine

## 2011-08-23 ENCOUNTER — Other Ambulatory Visit (HOSPITAL_COMMUNITY)
Admission: RE | Admit: 2011-08-23 | Discharge: 2011-08-23 | Disposition: A | Discharge status: Home / Self Care | Payer: BC Managed Care – PPO | Source: Ambulatory Visit | Attending: Family Medicine | Admitting: Family Medicine

## 2011-08-23 ENCOUNTER — Ambulatory Visit (INDEPENDENT_AMBULATORY_CARE_PROVIDER_SITE_OTHER): Payer: BC Managed Care – PPO | Admitting: Family Medicine

## 2011-08-23 DIAGNOSIS — N898 Other specified noninflammatory disorders of vagina: Secondary | ICD-10-CM

## 2011-08-23 DIAGNOSIS — Z01419 Encounter for gynecological examination (general) (routine) without abnormal findings: Secondary | ICD-10-CM | POA: Insufficient documentation

## 2011-08-23 DIAGNOSIS — M129 Arthropathy, unspecified: Secondary | ICD-10-CM

## 2011-08-23 DIAGNOSIS — M1990 Unspecified osteoarthritis, unspecified site: Secondary | ICD-10-CM | POA: Insufficient documentation

## 2011-08-23 DIAGNOSIS — Z0000 Encounter for general adult medical examination without abnormal findings: Secondary | ICD-10-CM

## 2011-08-23 DIAGNOSIS — Z124 Encounter for screening for malignant neoplasm of cervix: Secondary | ICD-10-CM

## 2011-08-23 MED ORDER — FLUCONAZOLE 150 MG PO TABS: 150.0000 mg | ORAL_TABLET | Freq: Every day | ORAL | Status: AC

## 2011-08-23 NOTE — Progress Notes (Signed)
  Subjective:    Patient ID: Rebecca Cuevas, female    DOB: [birth year], 50 y.o.   MRN: [id number]  HPI Here today with multiple complaints.  Has some "bite" on her back.  Reports vaginal discharge and irritation and painful defecation.  Wonders if she has a hemorrhoid. Also notes continued side pain, feels like she pulled something.  Better with aleve.  Hurts mostly while in bed.   Review of Systems  Constitutional: Negative for fever, chills, activity change and unexpected weight change.  HENT: Negative for hearing loss, congestion, sore throat and rhinorrhea.   Eyes: Negative for visual disturbance.  Respiratory: Negative for cough, chest tightness, shortness of breath and wheezing.   Cardiovascular: Negative for chest pain and leg swelling.  Gastrointestinal: Positive for abdominal distention and rectal pain. Negative for vomiting, diarrhea, constipation, blood in stool and anal bleeding.  Genitourinary: Negative for dysuria, flank pain and menstrual problem.  Musculoskeletal: Positive for joint swelling and arthralgias.  Skin: Positive for rash.  Hematological: Negative for adenopathy.  Psychiatric/Behavioral: Negative for behavioral problems and confusion.       Objective:   Physical Exam  Vitals reviewed. Constitutional: She appears well-developed and well-nourished.  HENT:  Head: Normocephalic and atraumatic.  Eyes: Pupils are equal, round, and reactive to light. No scleral icterus.  Neck: Normal range of motion. Neck supple.  Cardiovascular: Normal rate and normal heart sounds.   Pulmonary/Chest: Effort normal and breath sounds normal. Right breast exhibits no mass and no tenderness. Left breast exhibits no mass and no tenderness.  Abdominal: Soft. Bowel sounds are normal. She exhibits no mass. There is no tenderness.  Genitourinary: Vagina normal. Cervix exhibits no motion tenderness and no discharge.       Uterus absent Yeast noted peri-anally  Lymphadenopathy:    She  has no cervical adenopathy.    She has no axillary adenopathy.  Skin:       Macular papular lesions with satellite lesions on back.          Assessment & Plan:  CPE Yeast infection Arthritis Contact dermatitis.

## 2011-08-23 NOTE — Patient Instructions (Addendum)
Preventative Care for Adults - Female Studies show that half of deaths in the [place] today result from unhealthy lifestyle practices. This includes ignoring preventive care suggestions. Preventive health guidelines for women include the following key practices:  A routine yearly physical is a good way to check with your primary caregiver about your health and preventive screening. It is a chance to share any concerns and updates on your health, and to receive a thorough all-over exam.   If you smoke cigarettes, find out from your caregiver how to quit. It can literally save your life, no matter how long you have been a tobacco user. If you do not use tobacco, never start.   Maintain a healthy diet and normal weight. Increased weight leads to problems with blood pressure and diabetes. Decrease saturated fat in your diet and increase regular exercise. Eat a variety of foods, including fruit, vegetables, animal or vegetable protein (meat, fish, chicken, and eggs, or beans, lentils, and tofu), and grains, such as rice. Get information about proper diet from your caregiver, if needed.   Aerobic exercise helps maintain good heart health. The CDC and the American College of Sports Medicine recommend 30 minutes of moderate-intensity exercise (a brisk walk that increases your heart rate and breathing) on most days of the week. Ongoing high blood pressure should be treated with medicines, if weight loss and exercise are not effective.   Avoid smoking, drinking too much alcohol (more than two drinks per day), and use of street drugs. Do not share needles with anyone. Ask for professional help if you need support or instructions about stopping the use of alcohol, cigarettes, or drugs.   Maintain normal blood lipids and cholesterol, by minimizing your intake of saturated fat. Eat a well rounded diet, with plenty of fruit and vegetables. The National Institutes of Health encourage women to eat 5-9 servings of  fruit and vegetables each day. Your caregiver can give instructions to help you keep your risk of heart disease or stroke low. High blood pressure causes heart disease and increases risk of stroke. Blood pressure should be checked every 1-2 years, from age 20 onward.   Blood tests for high cholesterol, which causes heart and vessel disease, should begin at age 20 and be repeated every 5 years, if test results are normal. (Repeat tests more often if results are high.)   Diabetes screening involves taking a blood sample to check your blood sugar level, after a fasting period. This is done once every 3 years, after age 45, if test results are normal.   Breast cancer screening is essential to preventive care for women. All women age 20 and older should perform a breast self-exam every month. At age 40 and older, women should have their caregiver complete a breast exam each year. Women at ages 40-50 should have a mammogram (x-ray film) of the breasts each year. Your caregiver can discuss when to start your yearly mammograms.   Cervical cancer screening includes taking a Pap smear (sample of cells examined under a microscope) from the cervix (end of the uterus). It also includes testing for HPV (Human Papilloma Virus, which can cause cervical cancer). Screening and a pelvic exam should begin at age 21, or 3 years after a woman becomes sexually active. Screening should occur every year, with a Pap smear but no HPV testing, up to age 30. After age 30, you should have a Pap smear every 3 years with HPV testing, if no HPV was found previously.     Colon cancer can be detected, and often prevented, long before it is life threatening. Most routine colon cancer screening begins at the age of 50. On a yearly basis, your caregiver may provide easy-to-use take-home tests to check for hidden blood in the stool. Use of a small camera at the end of a tube, to directly examine the colon (sigmoidoscopy or colonoscopy), can  detect the earliest forms of colon cancer and can be life saving. Talk to your caregiver about this at age 50, when routine screening begins. (Screening is repeated every 5 years, unless early forms of pre-cancerous polyps or small growths are found.)   Practice safe sex. Use condoms. Condoms are used for birth control and to reduce the spread of sexually transmitted infections (STIs). Unsafe sex is sexual activity without the use of safeguards, such as condoms and avoidance of high-risk acts, to reduce the chances of getting or spreading STIs. STIs include gonorrhea (the clap), chlamydia, syphilis, trichimonas, herpes, HPV (human papilloma virus) and HIV (human immunodeficiency virus), which causes AIDS. Herpes, HIV, and HPV are viral illnesses that have no cure. They can result in disability, cancer, and death.   HPV causes cancer of the cervix, and other infections that can be transmitted from person to person. There is a vaccine for HPV, and females should get immunized between the ages of 11 and 26. It requires a series of 3 shots.   Osteoporosis is a disease in which the bones lose minerals and strength as we age. This can result in serious bone fractures. Risk of osteoporosis can be identified using a bone density scan. Women ages 65 and over should discuss this with their caregivers, as should women after menopause who have other risk factors. Ask your caregiver whether you should be taking a calcium supplement and Vitamin D, to reduce the rate of osteoporosis.   Menopause can be associated with physical symptoms and risks. Hormone replacement therapy is available to decrease these. You should talk to your caregiver about whether starting or continuing to take hormones is right for you.   Use sunscreen with SPF (skin protection factor) of 15 or more. Apply sunscreen liberally and repeatedly throughout the day. Being outside in the sun, when your shadow is shorter than you are, means you are being  exposed to sun at greater intensity. Lighter skinned people are at a greater risk of skin cancer. Wear sunglasses, to protect your eyes from too much damaging sunlight (which can speed up cataract formation).   Once a month, do a whole body skin exam or review, using a mirror to look at your back. Notify your caregiver of changes in moles, especially if there are changes in shapes, colors, irregular border, a size larger than a pencil eraser, or new moles develop.   Keep carbon monoxide and smoke detectors in your home, and functioning, at all times. Change the batteries every 6 months, or use a model that plugs into the wall.   Stay up to date with your tetanus shots and other required immunizations. A booster for tetanus should be given every 10 years. Be sure to get your flu shot every year, since 5%-20% of the [place] population comes down with the flu. The composition of the flu vaccine changes each year, so being vaccinated once is not enough. Get your shot in the fall, before the flu season peaks. The table below lists important vaccines to get. Other vaccines to consider include for Hepatitis A virus (to prevent a form of   infection of the liver, by a virus acquired from food), Varicella Zoster (a virus that causes shingles), and Meninogoccal (against bacteria which cause a form of meningitis).   Brush your teeth twice a day with fluoride toothpaste, and floss once a day. Good oral hygiene prevents tooth decay and gum disease, which can be painful and can cause other health problems. Visit your dentist for a routine oral and dental check up and preventive care every 6-12 months.   The Body Mass Index or BMI is a way of measuring how much of your body is fat. Having a BMI above 27 increases the risk of heart disease, diabetes, hypertension, stroke and other problems related to obesity. Your caregiver can help determine your BMI, and can develop an exercise and dietary program to help you achieve or  maintain this measurement at a healthy level.   Wear seat belts whenever you are in a vehicle, whether as passenger or driver, and even for short drives of a few minutes.   If you bicycle, wear a helmet at all times.  Preventative Care for Adult Women  Preventative Services Ages 39-39 Ages 44-64 Ages 32 and over  Health risk assessment and lifestyle counseling.     Blood pressure check.** Every 1-2 years Every 1-2 years Every 1-2 years  Total cholesterol check including HDL.** Every 5 years beginning at age 61 Every 5 years beginning at age 19, or more often if risk is high Every 5 years through age 14, then optional  Breast self exam. Monthly in all women ages 15 and older Monthly Monthly  Clinical breast exam.** Every 3 years beginning at age 46 Every year Every year  Mammogram.**  Every year beginning at age 72, optional from age 108-49 (discuss with your caregiver). Every year until age 59, then optional  Pap Smear** and HPV Screening. Every year from ages 52 through 69 Every 3 years from ages 29 through 34, if HPV is negative Optional; talk with your caregiver  Flexible sigmoidoscopy** or colonoscopy.**   Every 5 years beginning at age 51 Every 5 years until age 70; then optional  FOBT (fecal occult blood test) of stool.  Every year beginning at age 4 Every year until 61; then optional  Skin self-exam. Monthly Monthly Monthly  Tetanus-diphtheria (Td) immunization. Every 10 years Every 10 years Every 10 years  Influenza immunization.** Every year Every year Every year  HPV immunization. Once between the ages of 39 and 31     Pneumococcal immunization.** Optional Optional Every 5 years  Hepatitis B immunization.** Series of 3 immunizations  (if not done previously, usually given at 0, 1 to 2, and 4 to 6 months)  Check with your caregiver, if vaccination not previously given Check with your caregiver, if vaccination not previously given  ** Family history and personal history of risk and  conditions may change your caregiver's recommendations.  Document Released: 01/25/2002 Document Re-Released: 02/23/2010 [facility] Patient Information 2011 [place], [place]. Candida Infection In Adults A candida infection (also called yeast, fungus and Monilia infection) is an overgrowth of yeast that can occur present anywhere on the body. A yesast infection commonly occurs in warm, moist body areas. Usually, the infection remains localized but can spread to become a systemic infection. A yeast infection may be a sign of a more severe disease such as diabetes, leukemia, or AIDS. A yeast infection can occur in both men and women. In women, Candida vaginitis is a vaginal infection. It is one of the most  common causes of vaginitis. Men usually do not have symptoms or know they have an infection until other problems develop. Men may find out they have a yeast infection because their sex partner has a yeast infection. Uncircumcised men are more likely to get a yeast infection than circumcised men. This is because the uncircumcised glans is not exposed to air and does not remain as dry as that of a circumcised glans. Older adults may develop yeast infections around dentures. CAUSES Women  Antibiotics.  Steroid medication taken for a long time.   Being overweight (obese).   Diabetes.   Poor immune condition.   Certain serious medical conditions.   Immune suppressive medications for organ transplant patients.  Chemotherapy.   Pregnancy.   Menstration.   Stress and fatigue.   Intravenous drug use.   Oral contraceptives.  Wearing tight-fitting clothes in the crotch area.   Catching it from a sex partner who has a yeast infection.   Spermicide.   Intravenous, urinary, or other catheters.   Men  Catching it from a sex partner who has a yeast infection.  Having oral or anal sex with a person who has the infection.   Spermicide.   Diabetes.  Antibiotics.   Poor immune system.    Medications that suppress the immune system.   Intravenous drug use.  Intravenous, urinary, or other catheters.   SYMPTOMS Women  Thick, white vaginal discharge.  Vaginal itching.   Redness and swelling in and around the vagina.   Irritation of the lips of the vagina and perineum.   Blisters on the vaginal lips and perineum.  Painful sexual intercourse.   Low blood sugar (hypoglycemia).   Painful urination.   Bladder infections.  Intestinal problems such as constipation, indigestion, bad breath, bloating, increase in gas, diarrhea, or loose stools.   Men  Men may develop intestinal problems such as constipation, indigestion, bad breath, bloating, increase in gas, diarrhea, or loose stools.  Dry, cracked skin on the penis with itching or discomfort.  Jock itch.   Dry, flaky skin.   Athlete's foot.  Hypoglycemia.   DIAGNOSIS Women  A history and an exam are performed.   The discharge may be examined under a microscope.   A culture may be taken of the discharge.  Men  A history and an exam are performed.   Any discharge from the penis or areas of cracked skin will be looked at under the microscope and cultured.   Stool samples may be cultured.  TREATMENT Women  Vaginal antifungal suppositories and creams.  Medicated creams to decrease irritation and itching on the outside of the vagina.   Warm compresses to the perineal area to decrease swelling and discomfort.   Oral antifungal medications.   Medicated vaginal suppositories or cream for repeated or recurrent infections.   Wash and dry the irritation areas before applying the cream.  Eating yogurt with lactobacillus may help with prevention and treatment.   Sometimes painting the vagina with gentian violet solution may help if creams and suppositories do not work.   Men  Antifungal creams and oral antifungal medications.  Sometimes treatment must continue for 30 days after the symptoms go away to  prevent recurrence.  HOME CARE INSTRUCTIONS Women  Use cotton underwear and avoid tight-fitting clothing.  Avoid colored, scented toilet paper and deodorant tampons or pads.   Do not douche.   Keep your diabetes under control.   Finish all the prescribed medications.   Keep your skin  clean and dry.   Consume milk or yogurt with lactobacillus active culture regularly. If you get frequent yeast infections and think that is what the infection is, there are over-the-counter medications that you can get. If the infection does not show healing in 3 days, talk to your caregiver.  Tell your sex partner you have a yeast infection. Your partner may need treatment also, especially if your infection does not clear up or recurs.   Men  Keep your skin clean and dry.  Keep your diabetes under control.   Finish all prescribed medications.   Tell your sex partner that you have a yeast infection so they can be treated if necessary.  SEEK MEDICAL CARE IF:  Your symptoms do not clear up or worsen in one week after treatment.   You have an oral temperature above 101.   You have trouble swallowing or eating for a prolonged time.   You develop blisters on and around your vagina.   You develop vaginal bleeding and it is not your menstrual period.   You develop abdominal pain.   You develop intestinal problems as mentioned above.   You get weak or lightheaded.   You have painful or increased urination.   You have pain during sexual intercourse.  MAKE SURE YOU:  Understand these instructions.   Will watch your condition.   Will get help right away if you are not doing well or get worse.  Document Released: 01/06/2005 Document Re-Released: 05/19/2010 [facility] Patient Information 2011 [place], [place].

## 2011-08-27 ENCOUNTER — Other Ambulatory Visit: Payer: BC Managed Care – PPO

## 2011-08-27 DIAGNOSIS — Z0000 Encounter for general adult medical examination without abnormal findings: Secondary | ICD-10-CM

## 2011-08-27 LAB — COMPREHENSIVE METABOLIC PANEL
Albumin: 3.9 g/dL (ref 3.5–5.2)
Alkaline Phosphatase: 78 U/L (ref 39–117)
BUN: 10 mg/dL (ref 6–23)
Calcium: 8.9 mg/dL (ref 8.4–10.5)
Creat: 0.82 mg/dL (ref 0.50–1.10)
Glucose, Bld: 111 mg/dL — ABNORMAL HIGH (ref 70–99)
Potassium: 4.6 mEq/L (ref 3.5–5.3)

## 2011-08-27 LAB — CBC
Hemoglobin: 13 g/dL (ref 12.0–15.0)
RBC: 4.88 MIL/uL (ref 3.87–5.11)

## 2011-08-27 NOTE — Progress Notes (Signed)
LABS DRAWN FOR CMP,CBC,LIPID,TSH CNEWSOME

## 2011-08-28 LAB — LIPID PANEL
Cholesterol: 182 mg/dL (ref 0–200)
Total CHOL/HDL Ratio: 4.1 Ratio
Triglycerides: 149 mg/dL (ref <150)
VLDL: 30 mg/dL (ref 0–40)

## 2011-08-29 ENCOUNTER — Encounter: Payer: Self-pay | Admitting: Family Medicine

## 2012-01-24 ENCOUNTER — Encounter: Payer: Self-pay | Admitting: Family Medicine

## 2012-01-24 ENCOUNTER — Ambulatory Visit (INDEPENDENT_AMBULATORY_CARE_PROVIDER_SITE_OTHER): Payer: BC Managed Care – PPO | Admitting: Family Medicine

## 2012-01-24 VITALS — BP 135/84 | HR 86 | Ht 62.0 in | Wt 202.0 lb

## 2012-01-24 DIAGNOSIS — L2089 Other atopic dermatitis: Secondary | ICD-10-CM

## 2012-01-24 DIAGNOSIS — N898 Other specified noninflammatory disorders of vagina: Secondary | ICD-10-CM

## 2012-01-24 DIAGNOSIS — L293 Anogenital pruritus, unspecified: Secondary | ICD-10-CM

## 2012-01-24 DIAGNOSIS — B354 Tinea corporis: Secondary | ICD-10-CM

## 2012-01-24 LAB — POCT WET PREP (WET MOUNT)

## 2012-01-24 MED ORDER — NYSTATIN 100000 UNIT/GM EX CREA: TOPICAL_CREAM | Freq: Two times a day (BID) | CUTANEOUS | Status: DC

## 2012-01-24 NOTE — Progress Notes (Signed)
  Subjective:    Patient ID: Rebecca Cuevas, female    DOB: 1961/07/23, 51 y.o.   MRN: [id number]  Vaginal Itching The patient's primary symptoms include genital itching. The patient's pertinent negatives include no vaginal discharge. This is a chronic problem. The current episode started more than 1 month ago. The problem has been unchanged. She is not pregnant. Pertinent negatives include no diarrhea, fever or vomiting. The symptoms are aggravated by nothing. She has tried nothing (gold bond) for the symptoms. No, her partner does not have an STD. She uses hysterectomy for contraception. She is postmenopausal. Her past medical history is significant for an abdominal surgery.      Review of Systems  Constitutional: Negative for fever.  Gastrointestinal: Negative for vomiting and diarrhea.  Genitourinary: Negative for vaginal discharge.       Objective:   Physical Exam  Vitals reviewed. Constitutional: She appears well-developed and well-nourished.  HENT:  Head: Normocephalic and atraumatic.  Neck: Neck supple.  Cardiovascular: Normal rate.   Pulmonary/Chest: Effort normal.  Abdominal: Soft.  Genitourinary: There is rash on the right labia. There is rash on the left labia. No vaginal discharge found.       Pink rash with minimal edema          Assessment & Plan:   1. Vaginal itching  POCT Wet Prep [facility])  2. Tinea corporis    Nystatin, keep area dry.  For eczema--dove soap, moisterize.

## 2012-01-24 NOTE — Patient Instructions (Signed)
Jock Itch Jock itch is a fungal infection of the skin in the groin area. It is sometimes called "ringworm" even though it is not caused by a worm. A fungus is a type of germ that thrives in dark, damp places.  CAUSES  This infection may spread from:  A fungus infection elsewhere on the body (such as athlete's foot)   Sharing towels, clothing, etc.  This infection is more common in:  Hot, humid climates.   People who wear tight-fitting clothing or wet bathing suits for long periods of time.   Athletes.   Overweight people.   People with diabetes.  SYMPTOMS  Jock itch causes the following symptoms:  Red, pink or brown rash in the groin. Rash may spread to the thighs, anus and buttocks.   Itching  DIAGNOSIS  Your caregiver may make the diagnosis by looking at the rash. Sometimes a skin scraping will be sent to test for fungus. Testing can be done either by looking under the microscope or by doing a culture (test to try to grow the fungus). A culture can take up to 2 weeks to come back. TREATMENT  Jock itch may be treated with:  Skin cream or ointment to kill fungus.   Medicine by mouth to kill fungus.   Skin cream or ointment to calm the itching.   Compresses or medicated powders to dry the infected skin.  HOME CARE INSTRUCTIONS   Be sure to treat the rash completely. Follow your caregiver's instructions. It can take a couple of weeks to treat. If you do not treat the infection long enough, the rash can come back.   Wear loose fitting clothing.   Men should wear cotton boxer shorts   Women should wear cotton underwear.   Avoid hot baths.   Dry the groin area well after bathing.  SEEK MEDICAL CARE IF:   Your rash is worse.   Your rash is spreading.   Your rash returns after treatment is finished.   Your rash is not gone in 4 weeks. Fungal infections are slow to respond to treatment. Some redness may remain for several weeks after the fungus is gone.  SEEK  IMMEDIATE MEDICAL CARE IF:  The area becomes red, warm, tender, and swollen.   You have a fever.  Document Released: 11/19/2002 Document Revised: 08/11/2011 Document Reviewed: 10/18/2008 [facility] Patient Information 2012 [place], [place].Eczema Atopic dermatitis, or eczema, is an inherited type of sensitive skin. Often people with eczema have a family history of allergies, asthma, or hay fever. It causes a red itchy rash and dry scaly skin. The itchiness may occur before the skin rash and may be very intense. It is not contagious. Eczema is generally worse during the cooler winter months and often improves with the warmth of summer. Eczema usually starts showing signs in infancy. Some children outgrow eczema, but it may last through adulthood. Flare-ups may be caused by:  Eating something or contact with something you are sensitive or allergic to.   Stress.  DIAGNOSIS  The diagnosis of eczema is usually based upon symptoms and medical history. TREATMENT  Eczema cannot be cured, but symptoms usually can be controlled with treatment or avoidance of allergens (things to which you are sensitive or allergic to).  Controlling the itching and scratching.   Use over-the-counter antihistamines as directed for itching. It is especially useful at night when the itching tends to be worse.   Use over-the-counter steroid creams as directed for itching.   Scratching makes  the rash and itching worse and may cause impetigo (a skin infection) if fingernails are contaminated (dirty).   Keeping the skin well moisturized with creams every day. This will seal in moisture and help prevent dryness. Lotions containing alcohol and water can dry the skin and are not recommended.   Limiting exposure to allergens.   Recognizing situations that cause stress.   Developing a plan to manage stress.  HOME CARE INSTRUCTIONS   Take prescription and over-the-counter medicines as directed by your caregiver.   Do not  use anything on the skin without checking with your caregiver.   Keep baths or showers short (5 minutes) in warm (not hot) water. Use mild cleansers for bathing. You may add non-perfumed bath oil to the bath water. It is best to avoid soap and bubble bath.   Immediately after a bath or shower, when the skin is still damp, apply a moisturizing ointment to the entire body. This ointment should be a petroleum ointment. This will seal in moisture and help prevent dryness. The thicker the ointment the better. These should be unscented.   Keep fingernails cut short and wash hands often. If your child has eczema, it may be necessary to put soft gloves or mittens on your child at night.   Dress in clothes made of cotton or cotton blends. Dress lightly, as heat increases itching.   Avoid foods that may cause flare-ups. Common foods include cow's milk, peanut butter, eggs and wheat.   Keep a child with eczema away from anyone with fever blisters. The virus that causes fever blisters (herpes simplex) can cause a serious skin infection in children with eczema.  SEEK MEDICAL CARE IF:   Itching interferes with sleep.   The rash gets worse or is not better within one week following treatment.   The rash looks infected (pus or soft yellow scabs).   You or your child has an oral temperature above 102 F (38.9 C).   Your baby is older than 3 months with a rectal temperature of 100.5 F (38.1 C) or higher for more than 1 day.   The rash flares up after contact with someone who has fever blisters.  SEEK IMMEDIATE MEDICAL CARE IF:   Your baby is older than 3 months with a rectal temperature of 102 F (38.9 C) or higher.   Your baby is older than 3 months or younger with a rectal temperature of 100.4 F (38 C) or higher.  Document Released: 11/26/2000 Document Revised: 08/11/2011 Document Reviewed: 10/01/2009 [facility] Patient Information 2012 [place], [place].

## 2012-07-07 ENCOUNTER — Ambulatory Visit: Payer: BC Managed Care – PPO | Admitting: Family Medicine

## 2012-07-26 ENCOUNTER — Ambulatory Visit (INDEPENDENT_AMBULATORY_CARE_PROVIDER_SITE_OTHER): Payer: BC Managed Care – PPO | Admitting: *Deleted

## 2012-07-26 VITALS — BP 132/90 | HR 72

## 2012-07-26 DIAGNOSIS — I10 Essential (primary) hypertension: Secondary | ICD-10-CM

## 2012-07-26 NOTE — Progress Notes (Signed)
Patient in for BP check because she started with headache yesterday . Has taken only one aspirin without relief. . Has not taken anything today and states head throbs across forehead.  BP checked manually using large adult cuff. BP LA 140/90 and RA 132/90 pulse 72.  Suggested patient try ibuprofen . Dr. Gwendolyn Grant notified of findings .  Advised if headache continuing come back for evaluation. Has appointment with Dr. Shawnie Pons on 08/26. Patient requested note stating she was here today for this appointment  and this is given.

## 2012-08-07 ENCOUNTER — Ambulatory Visit (INDEPENDENT_AMBULATORY_CARE_PROVIDER_SITE_OTHER): Payer: BC Managed Care – PPO | Admitting: Family Medicine

## 2012-08-07 ENCOUNTER — Encounter: Payer: Self-pay | Admitting: Family Medicine

## 2012-08-07 VITALS — BP 128/82 | HR 83 | Temp 98.5°F | Ht 66.0 in | Wt 207.0 lb

## 2012-08-07 DIAGNOSIS — B354 Tinea corporis: Secondary | ICD-10-CM

## 2012-08-07 DIAGNOSIS — IMO0001 Reserved for inherently not codable concepts without codable children: Secondary | ICD-10-CM

## 2012-08-07 DIAGNOSIS — L259 Unspecified contact dermatitis, unspecified cause: Secondary | ICD-10-CM

## 2012-08-07 DIAGNOSIS — L2089 Other atopic dermatitis: Secondary | ICD-10-CM

## 2012-08-07 DIAGNOSIS — Z1239 Encounter for other screening for malignant neoplasm of breast: Secondary | ICD-10-CM

## 2012-08-07 DIAGNOSIS — L309 Dermatitis, unspecified: Secondary | ICD-10-CM

## 2012-08-07 DIAGNOSIS — R030 Elevated blood-pressure reading, without diagnosis of hypertension: Secondary | ICD-10-CM

## 2012-08-07 DIAGNOSIS — E669 Obesity, unspecified: Secondary | ICD-10-CM

## 2012-08-07 DIAGNOSIS — F17200 Nicotine dependence, unspecified, uncomplicated: Secondary | ICD-10-CM

## 2012-08-07 DIAGNOSIS — I10 Essential (primary) hypertension: Secondary | ICD-10-CM

## 2012-08-07 MED ORDER — HYDROCHLOROTHIAZIDE 25 MG PO TABS: 12.5000 mg | ORAL_TABLET | Freq: Every day | ORAL | Status: DC

## 2012-08-07 MED ORDER — TRIAMCINOLONE ACETONIDE 0.1 % EX CREA: TOPICAL_CREAM | Freq: Two times a day (BID) | CUTANEOUS | Status: DC

## 2012-08-07 MED ORDER — NYSTATIN 100000 UNIT/GM EX CREA: TOPICAL_CREAM | Freq: Two times a day (BID) | CUTANEOUS | Status: DC

## 2012-08-07 NOTE — Progress Notes (Signed)
  Subjective:    Patient ID: Rebecca Cuevas, female    DOB: 1961-03-27, 51 y.o.   MRN: [id number]  HPI  Patient comes in today with complaints of elevated blood pressure readings and right-sided headache. She wonders if she could have sinus problems she denies fainting URI symptoms or sore throat. The patient reports that her blood pressures have been elevated as an outpatient as high as 160/90 and 167/103. The patient's blood pressure looks good today.  The patient also complains of occasional stomach cramping with vomiting and now persistent diarrhea. 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 The patient also complains of right ear pain. Patient also complains of chronic yeast infection around her groin. She has been using Gold [employer] powder in this region this been increasingly stress and has noted increased perspiration and moisture.  Review of Systems  Constitutional: Negative for fever and chills.  HENT: Negative for nosebleeds.   Respiratory: Negative for chest tightness and shortness of breath.   Cardiovascular: Negative for chest pain and leg swelling.  Gastrointestinal: Negative for abdominal pain.  Psychiatric/Behavioral: The patient is not nervous/anxious.        Objective:   Physical Exam  Vitals reviewed. Constitutional: She is oriented to person, place, and time. She appears well-developed and well-nourished.  HENT:  Head: Normocephalic and atraumatic.       Next sinus tenderness Has erythematous changes in the right pinna  Eyes: No scleral icterus.  Neck: Neck supple.  Cardiovascular: Normal rate and regular rhythm.   No murmur heard. Pulmonary/Chest: Effort normal and breath sounds normal. She has no wheezes.  Abdominal: Soft. There is no tenderness.  Musculoskeletal: Normal range of motion. She exhibits no edema.  Neurological: She is alert and oriented to person, place, and time.  Skin: Skin is warm and dry. No rash  noted.  Psychiatric: She has a normal mood and affect. Her behavior is normal. Thought content normal.          Assessment & Plan:  Agrees to colonoscopy in 1 year. Mammogram

## 2012-08-07 NOTE — Assessment & Plan Note (Signed)
Continue to reinforce smoking cessation.

## 2012-08-07 NOTE — Patient Instructions (Addendum)
Jock Itch Jock itch is a fungal infection of the skin in the groin area. It is sometimes called "ringworm" even though it is not caused by a worm. A fungus is a type of germ that thrives in dark, damp places.  CAUSES  This infection may spread from:  A fungus infection elsewhere on the body (such as athlete's foot).   Sharing towels or clothing.  This infection is more common in:  Hot, humid climates.   People who wear tight-fitting clothing or wet bathing suits for long periods of time.   Athletes.   Overweight people.   People with diabetes.  SYMPTOMS  Jock itch causes the following symptoms:  Red, pink or brown rash in the groin. Rash may spread to the thighs, anus, and buttocks.   Itching.  DIAGNOSIS  Your caregiver may make the diagnosis by looking at the rash. Sometimes a skin scraping will be sent to test for fungus. Testing can be done either by looking under the microscope or by doing a culture (test to try to grow the fungus). A culture can take up to 2 weeks to come back. TREATMENT  Jock itch may be treated with:  Skin cream or ointment to kill fungus.   Medicine by mouth to kill fungus.   Skin cream or ointment to calm the itching.   Compresses or medicated powders to dry the infected skin.  HOME CARE INSTRUCTIONS   Be sure to treat the rash completely. Follow your caregiver's instructions. It can take a couple of weeks to treat. If you do not treat the infection long enough, the rash can come back.   Wear loose-fitting clothing.   Men should wear cotton boxer shorts.   Women should wear cotton underwear.   Avoid hot baths.   Dry the groin area well after bathing.  SEEK MEDICAL CARE IF:   Your rash is worse.   Your rash is spreading.   Your rash returns after treatment is finished.   Your rash is not gone in 4 weeks. Fungal infections are slow to respond to treatment. Some redness may remain for several weeks after the fungus is gone.  SEEK  IMMEDIATE MEDICAL CARE IF:  The area becomes red, warm, tender, and swollen.   You have a fever.  Document Released: 11/19/2002 Document Revised: 11/18/2011 Document Reviewed: 10/18/2008 [facility] Patient Information 2012 [place], [place].Hypertension As your heart beats, it forces blood through your arteries. This force is your blood pressure. If the pressure is too high, it is called hypertension (HTN) or high blood pressure. HTN is dangerous because you may have it and not know it. High blood pressure may mean that your heart has to work harder to pump blood. Your arteries may be narrow or stiff. The extra work puts you at risk for heart disease, stroke, and other problems.  Blood pressure consists of two numbers, a higher number over a lower, 110/72, for example. It is stated as "110 over 72." The ideal is below 120 for the top number (systolic) and under 80 for the bottom (diastolic). Write down your blood pressure today. You should pay close attention to your blood pressure if you have certain conditions such as:  Heart failure.   Prior heart attack.   Diabetes   Chronic kidney disease.   Prior stroke.   Multiple risk factors for heart disease.  To see if you have HTN, your blood pressure should be measured while you are seated with your arm held at the  level of the heart. It should be measured at least twice. A one-time elevated blood pressure reading (especially in the Emergency Department) does not mean that you need treatment. There may be conditions in which the blood pressure is different between your right and left arms. It is important to see your caregiver soon for a recheck. Most people have essential hypertension which means that there is not a specific cause. This type of high blood pressure may be lowered by changing lifestyle factors such as:  Stress.   Smoking.   Lack of exercise.   Excessive weight.   Drug/tobacco/alcohol use.   Eating less salt.  Most people  do not have symptoms from high blood pressure until it has caused damage to the body. Effective treatment can often prevent, delay or reduce that damage. TREATMENT  When a cause has been identified, treatment for high blood pressure is directed at the cause. There are a large number of medications to treat HTN. These fall into several categories, and your caregiver will help you select the medicines that are best for you. Medications may have side effects. You should review side effects with your caregiver. If your blood pressure stays high after you have made lifestyle changes or started on medicines,   Your medication(s) may need to be changed.   Other problems may need to be addressed.   Be certain you understand your prescriptions, and know how and when to take your medicine.   Be sure to follow up with your caregiver within the time frame advised (usually within two weeks) to have your blood pressure rechecked and to review your medications.   If you are taking more than one medicine to lower your blood pressure, make sure you know how and at what times they should be taken. Taking two medicines at the same time can result in blood pressure that is too low.  SEEK IMMEDIATE MEDICAL CARE IF:  You develop a severe headache, blurred or changing vision, or confusion.   You have unusual weakness or numbness, or a faint feeling.   You have severe chest or abdominal pain, vomiting, or breathing problems.  MAKE SURE YOU:   Understand these instructions.   Will watch your condition.   Will get help right away if you are not doing well or get worse.  Document Released: 11/29/2005 Document Revised: 11/18/2011 Document Reviewed: 07/19/2008 [facility] Patient Information 2012 [place], [place]. Smoking Cessation This document explains the best ways for you to quit smoking and new treatments to help. It lists new medicines that can double or triple your chances of quitting and quitting for good. It  also considers ways to avoid relapses and concerns you may have about quitting, including weight gain. NICOTINE: A POWERFUL ADDICTION If you have tried to quit smoking, you know how hard it can be. It is hard because nicotine is a very addictive drug. For some people, it can be as addictive as heroin or cocaine. Usually, people make 2 or 3 tries, or more, before finally being able to quit. Each time you try to quit, you can learn about what helps and what hurts. Quitting takes hard work and a lot of effort, but you can quit smoking. QUITTING SMOKING IS ONE OF THE MOST IMPORTANT THINGS YOU WILL EVER DO.  You will live longer, feel better, and live better.   The impact on your body of quitting smoking is felt almost immediately:   Within 20 minutes, blood pressure decreases. Pulse returns to its  normal level.   After 8 hours, carbon monoxide levels in the blood return to normal. Oxygen level increases.   After 24 hours, chance of heart attack starts to decrease. Breath, hair, and body stop smelling like smoke.   After 48 hours, damaged nerve endings begin to recover. Sense of taste and smell improve.   After 72 hours, the body is virtually free of nicotine. Bronchial tubes relax and breathing becomes easier.   After 2 to 12 weeks, lungs can hold more air. Exercise becomes easier and circulation improves.   Quitting will reduce your risk of having a heart attack, stroke, cancer, or lung disease:   After 1 year, the risk of coronary heart disease is cut in half.   After 5 years, the risk of stroke falls to the same as a nonsmoker.   After 10 years, the risk of lung cancer is cut in half and the risk of other cancers decreases significantly.   After 15 years, the risk of coronary heart disease drops, usually to the level of a nonsmoker.   If you are pregnant, quitting smoking will improve your chances of having a healthy baby.   The people you live with, especially your children, will be  healthier.   You will have extra money to spend on things other than cigarettes.  FIVE KEYS TO QUITTING Studies have shown that these 5 steps will help you quit smoking and quit for good. You have the best chances of quitting if you use them together: 1. Get ready.  2. Get support and encouragement.  3. Learn new skills and behaviors.  4. Get medicine to reduce your nicotine addiction and use it correctly.  5. Be prepared for relapse or difficult situations. Be determined to continue trying to quit, even if you do not succeed at first.  1. GET READY  Set a quit date.   Change your environment.   Get rid of ALL cigarettes, ashtrays, matches, and lighters in your home, car, and place of work.   Do not let people smoke in your home.   Review your past attempts to quit. Think about what worked and what did not.   Once you quit, do not smoke. NOT EVEN A PUFF!  2. GET SUPPORT AND ENCOURAGEMENT Studies have shown that you have a better chance of being successful if you have help. You can get support in many ways.  Tell your family, friends, and coworkers that you are going to quit and need their support. Ask them not to smoke around you.   Talk to your caregivers (doctor, dentist, nurse, pharmacist, psychologist, and/or smoking counselor).   Get individual, group, or telephone counseling and support. The more counseling you have, the better your chances are of quitting. Programs are available at [employer] and health centers. Call your local health department for information about programs in your area.   Spiritual beliefs and practices may help some smokers quit.   Quit meters are Photographer that keep track of quit statistics, such as amount of "quit-time," cigarettes not smoked, and money saved.   Many smokers find one or more of the many self-help books available useful in helping them quit and stay off tobacco.  3. LEARN NEW SKILLS AND  BEHAVIORS  Try to distract yourself from urges to smoke. Talk to someone, go for a walk, or occupy your time with a task.   When you first try to quit, change your routine. Take a  different route to work. Drink tea instead of coffee. Eat breakfast in a different place.   Do something to reduce your stress. Take a hot bath, exercise, or read a book.   Plan something enjoyable to do every day. Reward yourself for not smoking.   Explore interactive web-based programs that specialize in helping you quit.  4. GET MEDICINE AND USE IT CORRECTLY Medicines can help you stop smoking and decrease the urge to smoke. Combining medicine with the above behavioral methods and support can quadruple your chances of successfully quitting smoking. The [place] Food and Drug Administration (FDA) has approved 7 medicines to help you quit smoking. These medicines fall into 3 categories.  Nicotine replacement therapy (delivers nicotine to your body without the negative effects and risks of smoking):   Nicotine gum: Available over-the-counter.   Nicotine lozenges: Available over-the-counter.   Nicotine inhaler: Available by prescription.   Nicotine nasal spray: Available by prescription.   Nicotine skin patches (transdermal): Available by prescription and over-the-counter.   Antidepressant medicine (helps people abstain from smoking, but how this works is unknown):   Bupropion sustained-release (SR) tablets: Available by prescription.   Nicotinic receptor partial agonist (simulates the effect of nicotine in your brain):   Varenicline tartrate tablets: Available by prescription.   Ask your caregiver for advice about which medicines to use and how to use them. Carefully read the information on the package.   Everyone who is trying to quit may benefit from using a medicine. If you are pregnant or trying to become pregnant, nursing an infant, you are under age 68, or you smoke fewer than 10 cigarettes per day,  talk to your caregiver before taking any nicotine replacement medicines.   You should stop using a nicotine replacement product and call your caregiver if you experience nausea, dizziness, weakness, vomiting, fast or irregular heartbeat, mouth problems with the lozenge or gum, or redness or swelling of the skin around the patch that does not go away.   Do not use any other product containing nicotine while using a nicotine replacement product.   Talk to your caregiver before using these products if you have diabetes, heart disease, asthma, stomach ulcers, you had a recent heart attack, you have high blood pressure that is not controlled with medicine, a history of irregular heartbeat, or you have been prescribed medicine to help you quit smoking.  5. BE PREPARED FOR RELAPSE OR DIFFICULT SITUATIONS  Most relapses occur within the first 3 months after quitting. Do not be discouraged if you start smoking again. Remember, most people try several times before they finally quit.   You may have symptoms of withdrawal because your body is used to nicotine. You may crave cigarettes, be irritable, feel very hungry, cough often, get headaches, or have difficulty concentrating.   The withdrawal symptoms are only temporary. They are strongest when you first quit, but they will go away within 10 to 14 days.  Here are some difficult situations to watch for:  Alcohol. Avoid drinking alcohol. Drinking lowers your chances of successfully quitting.   Caffeine. Try to reduce the amount of caffeine you consume. It also lowers your chances of successfully quitting.   Other smokers. Being around smoking can make you want to smoke. Avoid smokers.   Weight gain. Many smokers will gain weight when they quit, usually less than 10 pounds. Eat a healthy diet and stay active. Do not let weight gain distract you from your main goal, quitting smoking. Some medicines that  help you quit smoking may also help delay weight gain.  You can always lose the weight gained after you quit.   Bad mood or depression. There are a lot of ways to improve your mood other than smoking.  If you are having problems with any of these situations, talk to your caregiver. SPECIAL SITUATIONS AND CONDITIONS Studies suggest that everyone can quit smoking. Your situation or condition can give you a special reason to quit.  Pregnant women/new mothers: By quitting, you protect your baby's health and your own.   Hospitalized patients: By quitting, you reduce health problems and help healing.   Heart attack patients: By quitting, you reduce your risk of a second heart attack.   Lung, head, and neck cancer patients: By quitting, you reduce your chance of a second cancer.   Parents of children and adolescents: By quitting, you protect your children from illnesses caused by secondhand smoke.  QUESTIONS TO THINK ABOUT Think about the following questions before you try to stop smoking. You may want to talk about your answers with your caregiver.  Why do you want to quit?   If you tried to quit in the past, what helped and what did not?   What will be the most difficult situations for you after you quit? How will you plan to handle them?   Who can help you through the tough times? Your family? Friends? Caregiver?   What pleasures do you get from smoking? What ways can you still get pleasure if you quit?  Here are some questions to ask your caregiver:  How can you help me to be successful at quitting?   What medicine do you think would be best for me and how should I take it?   What should I do if I need more help?   What is smoking withdrawal like? How can I get information on withdrawal?  Quitting takes hard work and a lot of effort, but you can quit smoking. FOR MORE INFORMATION  Smokefree.gov (http://www.davis-sullivan.com/) provides free, accurate, evidence-based information and professional assistance to help support the immediate and  long-term needs of people trying to quit smoking. Document Released: 11/23/2001 Document Revised: 11/18/2011 Document Reviewed: 09/15/2009 [facility] Patient Information 2012 [place], [place].

## 2012-08-07 NOTE — Assessment & Plan Note (Signed)
Trial of Kenalog to external pinna.

## 2012-08-07 NOTE — Assessment & Plan Note (Signed)
The patient has seen frequently elevated blood pressures as outpatient, will begin low dose HCTZ 12.5 mg daily. The patient return in 3 months for recheck of her blood pressure as well as electrolytes. Patient to continue to monitor her blood pressure.

## 2012-08-07 NOTE — Assessment & Plan Note (Signed)
Continue nystatin cream as needed.

## 2013-01-12 ENCOUNTER — Other Ambulatory Visit (HOSPITAL_COMMUNITY)
Admission: RE | Admit: 2013-01-12 | Discharge: 2013-01-12 | Disposition: A | Discharge status: Home / Self Care | Payer: BC Managed Care – PPO | Source: Ambulatory Visit | Attending: Family Medicine | Admitting: Family Medicine

## 2013-01-12 ENCOUNTER — Ambulatory Visit (INDEPENDENT_AMBULATORY_CARE_PROVIDER_SITE_OTHER): Payer: BC Managed Care – PPO | Admitting: Family Medicine

## 2013-01-12 ENCOUNTER — Encounter: Payer: Self-pay | Admitting: Family Medicine

## 2013-01-12 VITALS — BP 137/86 | HR 83 | Temp 98.2°F | Ht 66.5 in | Wt 203.0 lb

## 2013-01-12 DIAGNOSIS — Z124 Encounter for screening for malignant neoplasm of cervix: Secondary | ICD-10-CM | POA: Insufficient documentation

## 2013-01-12 DIAGNOSIS — Z1239 Encounter for other screening for malignant neoplasm of breast: Secondary | ICD-10-CM

## 2013-01-12 DIAGNOSIS — L259 Unspecified contact dermatitis, unspecified cause: Secondary | ICD-10-CM

## 2013-01-12 DIAGNOSIS — Z1151 Encounter for screening for human papillomavirus (HPV): Secondary | ICD-10-CM | POA: Insufficient documentation

## 2013-01-12 DIAGNOSIS — I10 Essential (primary) hypertension: Secondary | ICD-10-CM

## 2013-01-12 DIAGNOSIS — B354 Tinea corporis: Secondary | ICD-10-CM

## 2013-01-12 DIAGNOSIS — Z01419 Encounter for gynecological examination (general) (routine) without abnormal findings: Secondary | ICD-10-CM | POA: Insufficient documentation

## 2013-01-12 DIAGNOSIS — L309 Dermatitis, unspecified: Secondary | ICD-10-CM

## 2013-01-12 DIAGNOSIS — Z1211 Encounter for screening for malignant neoplasm of colon: Secondary | ICD-10-CM

## 2013-01-12 DIAGNOSIS — F17200 Nicotine dependence, unspecified, uncomplicated: Secondary | ICD-10-CM

## 2013-01-12 DIAGNOSIS — L2089 Other atopic dermatitis: Secondary | ICD-10-CM

## 2013-01-12 MED ORDER — TRIAMCINOLONE ACETONIDE 0.1 % EX CREA: TOPICAL_CREAM | Freq: Two times a day (BID) | CUTANEOUS | Status: AC

## 2013-01-12 MED ORDER — NYSTATIN 100000 UNIT/GM EX CREA: TOPICAL_CREAM | Freq: Two times a day (BID) | CUTANEOUS | Status: AC

## 2013-01-12 NOTE — Patient Instructions (Addendum)
Eczema Atopic dermatitis, or eczema, is an inherited type of sensitive skin. Often people with eczema have a family history of allergies, asthma, or hay fever. It causes a red itchy rash and dry scaly skin. The itchiness may occur before the skin rash and may be very intense. It is not contagious. Eczema is generally worse during the cooler winter months and often improves with the warmth of summer. Eczema usually starts showing signs in infancy. Some children outgrow eczema, but it may last through adulthood. Flare-ups may be caused by:  Eating something or contact with something you are sensitive or allergic to.  Stress. DIAGNOSIS  The diagnosis of eczema is usually based upon symptoms and medical history. TREATMENT  Eczema cannot be cured, but symptoms usually can be controlled with treatment or avoidance of allergens (things to which you are sensitive or allergic to).  Controlling the itching and scratching.  Use over-the-counter antihistamines as directed for itching. It is especially useful at night when the itching tends to be worse.  Use over-the-counter steroid creams as directed for itching.  Scratching makes the rash and itching worse and may cause impetigo (a skin infection) if fingernails are contaminated (dirty).  Keeping the skin well moisturized with creams every day. This will seal in moisture and help prevent dryness. Lotions containing alcohol and water can dry the skin and are not recommended.  Limiting exposure to allergens.  Recognizing situations that cause stress.  Developing a plan to manage stress. HOME CARE INSTRUCTIONS   Take prescription and over-the-counter medicines as directed by your caregiver.  Do not use anything on the skin without checking with your caregiver.  Keep baths or showers short (5 minutes) in warm (not hot) water. Use mild cleansers for bathing. You may add non-perfumed bath oil to the bath water. It is best to avoid soap and bubble  bath.  Immediately after a bath or shower, when the skin is still damp, apply a moisturizing ointment to the entire body. This ointment should be a petroleum ointment. This will seal in moisture and help prevent dryness. The thicker the ointment the better. These should be unscented.  Keep fingernails cut short and wash hands often. If your child has eczema, it may be necessary to put soft gloves or mittens on your child at night.  Dress in clothes made of cotton or cotton blends. Dress lightly, as heat increases itching.  Avoid foods that may cause flare-ups. Common foods include cow's milk, peanut butter, eggs and wheat.  Keep a child with eczema away from anyone with fever blisters. The virus that causes fever blisters (herpes simplex) can cause a serious skin infection in children with eczema. SEEK MEDICAL CARE IF:   Itching interferes with sleep.  The rash gets worse or is not better within one week following treatment.  The rash looks infected (pus or soft yellow scabs).  You or your child has an oral temperature above 102 F (38.9 C).  Your baby is older than 3 months with a rectal temperature of 100.5 F (38.1 C) or higher for more than 1 day.  The rash flares up after contact with someone who has fever blisters. SEEK IMMEDIATE MEDICAL CARE IF:   Your baby is older than 3 months with a rectal temperature of 102 F (38.9 C) or higher.  Your baby is older than 3 months or younger with a rectal temperature of 100.4 F (38 C) or higher. Document Released: 11/26/2000 Document Revised: 02/21/2012 Document Reviewed: 10/01/2009 [employer]   Patient Information 2013 [place], [place]. Smoking Cessation Quitting smoking is important to your health and has many advantages. However, it is not always easy to quit since nicotine is a very addictive drug. Often times, people try 3 times or more before being able to quit. This document explains the best ways for you to prepare to quit smoking.  Quitting takes hard work and a lot of effort, but you can do it. ADVANTAGES OF QUITTING SMOKING  You will live longer, feel better, and live better.  Your body will feel the impact of quitting smoking almost immediately.  Within 20 minutes, blood pressure decreases. Your pulse returns to its normal level.  After 8 hours, carbon monoxide levels in the blood return to normal. Your oxygen level increases.  After 24 hours, the chance of having a heart attack starts to decrease. Your breath, hair, and body stop smelling like smoke.  After 48 hours, damaged nerve endings begin to recover. Your sense of taste and smell improve.  After 72 hours, the body is virtually free of nicotine. Your bronchial tubes relax and breathing becomes easier.  After 2 to 12 weeks, lungs can hold more air. Exercise becomes easier and circulation improves.  The risk of having a heart attack, stroke, cancer, or lung disease is greatly reduced.  After 1 year, the risk of coronary heart disease is cut in half.  After 5 years, the risk of stroke falls to the same as a nonsmoker.  After 10 years, the risk of lung cancer is cut in half and the risk of other cancers decreases significantly.  After 15 years, the risk of coronary heart disease drops, usually to the level of a nonsmoker.  If you are pregnant, quitting smoking will improve your chances of having a healthy baby.  The people you live with, especially any children, will be healthier.  You will have extra money to spend on things other than cigarettes. QUESTIONS TO THINK ABOUT BEFORE ATTEMPTING TO QUIT You may want to talk about your answers with your caregiver.  Why do you want to quit?  If you tried to quit in the past, what helped and what did not?  What will be the most difficult situations for you after you quit? How will you plan to handle them?  Who can help you through the tough times? Your family? Friends? A caregiver?  What pleasures do  you get from smoking? What ways can you still get pleasure if you quit? Here are some questions to ask your caregiver:  How can you help me to be successful at quitting?  What medicine do you think would be best for me and how should I take it?  What should I do if I need more help?  What is smoking withdrawal like? How can I get information on withdrawal? GET READY  Set a quit date.  Change your environment by getting rid of all cigarettes, ashtrays, matches, and lighters in your home, car, or work. Do not let people smoke in your home.  Review your past attempts to quit. Think about what worked and what did not. GET SUPPORT AND ENCOURAGEMENT You have a better chance of being successful if you have help. You can get support in many ways.  Tell your family, friends, and co-workers that you are going to quit and need their support. Ask them not to smoke around you.  Get individual, group, or telephone counseling and support. Programs are available at [employer] and health centers.  Call your local health department for information about programs in your area.  Spiritual beliefs and practices may help some smokers quit.  Download a "quit meter" on your computer to keep track of quit statistics, such as how long you have gone without smoking, cigarettes not smoked, and money saved.  Get a self-help book about quitting smoking and staying off of tobacco. LEARN NEW SKILLS AND BEHAVIORS  Distract yourself from urges to smoke. Talk to someone, go for a walk, or occupy your time with a task.  Change your normal routine. Take a different route to work. Drink tea instead of coffee. Eat breakfast in a different place.  Reduce your stress. Take a hot bath, exercise, or read a book.  Plan something enjoyable to do every day. Reward yourself for not smoking.  Explore interactive web-based programs that specialize in helping you quit. GET MEDICINE AND USE IT CORRECTLY Medicines can help  you stop smoking and decrease the urge to smoke. Combining medicine with the above behavioral methods and support can greatly increase your chances of successfully quitting smoking.  Nicotine replacement therapy helps deliver nicotine to your body without the negative effects and risks of smoking. Nicotine replacement therapy includes nicotine gum, lozenges, inhalers, nasal sprays, and skin patches. Some may be available over-the-counter and others require a prescription.  Antidepressant medicine helps people abstain from smoking, but how this works is unknown. This medicine is available by prescription.  Nicotinic receptor partial agonist medicine simulates the effect of nicotine in your brain. This medicine is available by prescription. Ask your caregiver for advice about which medicines to use and how to use them based on your health history. Your caregiver will tell you what side effects to look out for if you choose to be on a medicine or therapy. Carefully read the information on the package. Do not use any other product containing nicotine while using a nicotine replacement product.  RELAPSE OR DIFFICULT SITUATIONS Most relapses occur within the first 3 months after quitting. Do not be discouraged if you start smoking again. Remember, most people try several times before finally quitting. You may have symptoms of withdrawal because your body is used to nicotine. You may crave cigarettes, be irritable, feel very hungry, cough often, get headaches, or have difficulty concentrating. The withdrawal symptoms are only temporary. They are strongest when you first quit, but they will go away within 10 14 days. To reduce the chances of relapse, try to:  Avoid drinking alcohol. Drinking lowers your chances of successfully quitting.  Reduce the amount of caffeine you consume. Once you quit smoking, the amount of caffeine in your body increases and can give you symptoms, such as a rapid heartbeat, sweating,  and anxiety.  Avoid smokers because they can make you want to smoke.  Do not let weight gain distract you. Many smokers will gain weight when they quit, usually less than 10 pounds. Eat a healthy diet and stay active. You can always lose the weight gained after you quit.  Find ways to improve your mood other than smoking. FOR MORE INFORMATION  www.smokefree.gov  Document Released: 11/23/2001 Document Revised: 05/30/2012 Document Reviewed: 03/09/2012 [facility] Patient Information 2013 [place], [place]. Preventive Care for Adults, Female A healthy lifestyle and preventive care can promote health and wellness. Preventive health guidelines for women include the following key practices.  A routine yearly physical is a good way to check with your caregiver about your health and preventive screening. It is a chance to share  any concerns and updates on your health, and to receive a thorough exam.  Visit your dentist for a routine exam and preventive care every 6 months. Brush your teeth twice a day and floss once a day. Good oral hygiene prevents tooth decay and gum disease.  The frequency of eye exams is based on your age, health, family medical history, use of contact lenses, and other factors. Follow your caregiver's recommendations for frequency of eye exams.  Eat a healthy diet. Foods like vegetables, fruits, whole grains, low-fat dairy products, and lean protein foods contain the nutrients you need without too many calories. Decrease your intake of foods high in solid fats, added sugars, and salt. Eat the right amount of calories for you.Get information about a proper diet from your caregiver, if necessary.  Regular physical exercise is one of the most important things you can do for your health. Most adults should get at least 150 minutes of moderate-intensity exercise (any activity that increases your heart rate and causes you to sweat) each week. In addition, most adults need  muscle-strengthening exercises on 2 or more days a week.  Maintain a healthy weight. The body mass index (BMI) is a screening tool to identify possible weight problems. It provides an estimate of body fat based on height and weight. Your caregiver can help determine your BMI, and can help you achieve or maintain a healthy weight.For adults 20 years and older:  A BMI below 18.5 is considered underweight.  A BMI of 18.5 to 24.9 is normal.  A BMI of 25 to 29.9 is considered overweight.  A BMI of 30 and above is considered obese.  Maintain normal blood lipids and cholesterol levels by exercising and minimizing your intake of saturated fat. Eat a balanced diet with plenty of fruit and vegetables. Blood tests for lipids and cholesterol should begin at age 3 and be repeated every 5 years. If your lipid or cholesterol levels are high, you are over 50, or you are at high risk for heart disease, you may need your cholesterol levels checked more frequently.Ongoing high lipid and cholesterol levels should be treated with medicines if diet and exercise are not effective.  If you smoke, find out from your caregiver how to quit. If you do not use tobacco, do not start.  If you are pregnant, do not drink alcohol. If you are breastfeeding, be very cautious about drinking alcohol. If you are not pregnant and choose to drink alcohol, do not exceed 1 drink per day. One drink is considered to be 12 ounces (355 mL) of beer, 5 ounces (148 mL) of wine, or 1.5 ounces (44 mL) of liquor.  Avoid use of street drugs. Do not share needles with anyone. Ask for help if you need support or instructions about stopping the use of drugs.  High blood pressure causes heart disease and increases the risk of stroke. Your blood pressure should be checked at least every 1 to 2 years. Ongoing high blood pressure should be treated with medicines if weight loss and exercise are not effective.  If you are 52 to 53 years old, ask your  caregiver if you should take aspirin to prevent strokes.  Diabetes screening involves taking a blood sample to check your fasting blood sugar level. This should be done once every 3 years, after age 66, if you are within normal weight and without risk factors for diabetes. Testing should be considered at a younger age or be carried out more frequently if  you are overweight and have at least 1 risk factor for diabetes.  Breast cancer screening is essential preventive care for women. You should practice "breast self-awareness." This means understanding the normal appearance and feel of your breasts and may include breast self-examination. Any changes detected, no matter how small, should be reported to a caregiver. Women in their 23s and 30s should have a clinical breast exam (CBE) by a caregiver as part of a regular health exam every 1 to 3 years. After age 47, women should have a CBE every year. Starting at age 45, women should consider having a mammography (breast X-ray test) every year. Women who have a family history of breast cancer should talk to their caregiver about genetic screening. Women at a high risk of breast cancer should talk to their caregivers about having magnetic resonance imaging (MRI) and a mammography every year.  The Pap test is a screening test for cervical cancer. A Pap test can show cell changes on the cervix that might become cervical cancer if left untreated. A Pap test is a procedure in which cells are obtained and examined from the lower end of the uterus (cervix).  Women should have a Pap test starting at age 12.  Between ages 59 and 78, Pap tests should be repeated every 2 years.  Beginning at age 21, you should have a Pap test every 3 years as long as the past 3 Pap tests have been normal.  Some women have medical problems that increase the chance of getting cervical cancer. Talk to your caregiver about these problems. It is especially important to talk to your  caregiver if a new problem develops soon after your last Pap test. In these cases, your caregiver may recommend more frequent screening and Pap tests.  The above recommendations are the same for women who have or have not gotten the vaccine for human papillomavirus (HPV).  If you had a hysterectomy for a problem that was not cancer or a condition that could lead to cancer, then you no longer need Pap tests. Even if you no longer need a Pap test, a regular exam is a good idea to make sure no other problems are starting.  If you are between ages 57 and 19, and you have had normal Pap tests going back 10 years, you no longer need Pap tests. Even if you no longer need a Pap test, a regular exam is a good idea to make sure no other problems are starting.  If you have had past treatment for cervical cancer or a condition that could lead to cancer, you need Pap tests and screening for cancer for at least 20 years after your treatment.  If Pap tests have been discontinued, risk factors (such as a new sexual partner) need to be reassessed to determine if screening should be resumed.  The HPV test is an additional test that may be used for cervical cancer screening. The HPV test looks for the virus that can cause the cell changes on the cervix. The cells collected during the Pap test can be tested for HPV. The HPV test could be used to screen women aged 7 years and older, and should be used in women of any age who have unclear Pap test results. After the age of 68, women should have HPV testing at the same frequency as a Pap test.  Colorectal cancer can be detected and often prevented. Most routine colorectal cancer screening begins at the age of 27 and  continues through age 3. However, your caregiver may recommend screening at an earlier age if you have risk factors for colon cancer. On a yearly basis, your caregiver may provide home test kits to check for hidden blood in the stool. Use of a small camera at  the end of a tube, to directly examine the colon (sigmoidoscopy or colonoscopy), can detect the earliest forms of colorectal cancer. Talk to your caregiver about this at age 39, when routine screening begins. Direct examination of the colon should be repeated every 5 to 10 years through age 84, unless early forms of pre-cancerous polyps or small growths are found.  Hepatitis C blood testing is recommended for all people born from [birth year] through 1966 and any individual with known risks for hepatitis C.  Practice safe sex. Use condoms and avoid high-risk sexual practices to reduce the spread of sexually transmitted infections (STIs). STIs include gonorrhea, chlamydia, syphilis, trichomonas, herpes, HPV, and human immunodeficiency virus (HIV). Herpes, HIV, and HPV are viral illnesses that have no cure. They can result in disability, cancer, and death. Sexually active women aged 43 and younger should be checked for chlamydia. Older women with new or multiple partners should also be tested for chlamydia. Testing for other STIs is recommended if you are sexually active and at increased risk.  Osteoporosis is a disease in which the bones lose minerals and strength with aging. This can result in serious bone fractures. The risk of osteoporosis can be identified using a bone density scan. Women ages 35 and over and women at risk for fractures or osteoporosis should discuss screening with their caregivers. Ask your caregiver whether you should take a calcium supplement or vitamin D to reduce the rate of osteoporosis.  Menopause can be associated with physical symptoms and risks. Hormone replacement therapy is available to decrease symptoms and risks. You should talk to your caregiver about whether hormone replacement therapy is right for you.  Use sunscreen with sun protection factor (SPF) of 30 or more. Apply sunscreen liberally and repeatedly throughout the day. You should seek shade when your shadow is shorter  than you. Protect yourself by wearing long sleeves, pants, a wide-brimmed hat, and sunglasses year round, whenever you are outdoors.  Once a month, do a whole body skin exam, using a mirror to look at the skin on your back. Notify your caregiver of new moles, moles that have irregular borders, moles that are larger than a pencil eraser, or moles that have changed in shape or color.  Stay current with required immunizations.  Influenza. You need a dose every fall (or winter). The composition of the flu vaccine changes each year, so being vaccinated once is not enough.  Pneumococcal polysaccharide. You need 1 to 2 doses if you smoke cigarettes or if you have certain chronic medical conditions. You need 1 dose at age 64 (or older) if you have never been vaccinated.  Tetanus, diphtheria, pertussis (Tdap, Td). Get 1 dose of Tdap vaccine if you are younger than age 41, are over 43 and have contact with an infant, are a Research scientist (physical sciences), are pregnant, or simply want to be protected from whooping cough. After that, you need a Td booster dose every 10 years. Consult your caregiver if you have not had at least 3 tetanus and diphtheria-containing shots sometime in your life or have a deep or dirty wound.  HPV. You need this vaccine if you are a woman age 14 or younger. The vaccine is given in 3  doses over 6 months.  Measles, mumps, rubella (MMR). You need at least 1 dose of MMR if you were born in 1957 or later. You may also need a second dose.  Meningococcal. If you are age 26 to 50 and a first-year college student living in a residence hall, or have one of several medical conditions, you need to get vaccinated against meningococcal disease. You may also need additional booster doses.  Zoster (shingles). If you are age 7 or older, you should get this vaccine.  Varicella (chickenpox). If you have never had chickenpox or you were vaccinated but received only 1 dose, talk to your caregiver to find out if  you need this vaccine.  Hepatitis A. You need this vaccine if you have a specific risk factor for hepatitis A virus infection or you simply wish to be protected from this disease. The vaccine is usually given as 2 doses, 6 to 18 months apart.  Hepatitis B. You need this vaccine if you have a specific risk factor for hepatitis B virus infection or you simply wish to be protected from this disease. The vaccine is given in 3 doses, usually over 6 months. Preventive Services / Frequency Ages 37 to 63  Blood pressure check.** / Every 1 to 2 years.  Lipid and cholesterol check.** / Every 5 years beginning at age 42.  Clinical breast exam.** / Every 3 years for women in their 60s and 30s.  Pap test.** / Every 2 years from ages 23 through 31. Every 3 years starting at age 37 through age 53 or 57 with a history of 3 consecutive normal Pap tests.  HPV screening.** / Every 3 years from ages 72 through ages 36 to 84 with a history of 3 consecutive normal Pap tests.  Hepatitis C blood test.** / For any individual with known risks for hepatitis C.  Skin self-exam. / Monthly.  Influenza immunization.** / Every year.  Pneumococcal polysaccharide immunization.** / 1 to 2 doses if you smoke cigarettes or if you have certain chronic medical conditions.  Tetanus, diphtheria, pertussis (Tdap, Td) immunization. / A one-time dose of Tdap vaccine. After that, you need a Td booster dose every 10 years.  HPV immunization. / 3 doses over 6 months, if you are 8 and younger.  Measles, mumps, rubella (MMR) immunization. / You need at least 1 dose of MMR if you were born in 1957 or later. You may also need a second dose.  Meningococcal immunization. / 1 dose if you are age 28 to 29 and a first-year college student living in a residence hall, or have one of several medical conditions, you need to get vaccinated against meningococcal disease. You may also need additional booster doses.  Varicella immunization.** /  Consult your caregiver.  Hepatitis A immunization.** / Consult your caregiver. 2 doses, 6 to 18 months apart.  Hepatitis B immunization.** / Consult your caregiver. 3 doses usually over 6 months. Ages 67 to 88  Blood pressure check.** / Every 1 to 2 years.  Lipid and cholesterol check.** / Every 5 years beginning at age 76.  Clinical breast exam.** / Every year after age 45.  Mammogram.** / Every year beginning at age 68 and continuing for as long as you are in good health. Consult with your caregiver.  Pap test.** / Every 3 years starting at age 34 through age 42 or 11 with a history of 3 consecutive normal Pap tests.  HPV screening.** / Every 3 years from ages 9 through  ages 54 to 72 with a history of 3 consecutive normal Pap tests.  Fecal occult blood test (FOBT) of stool. / Every year beginning at age 70 and continuing until age 81. You may not need to do this test if you get a colonoscopy every 10 years.  Flexible sigmoidoscopy or colonoscopy.** / Every 5 years for a flexible sigmoidoscopy or every 10 years for a colonoscopy beginning at age 64 and continuing until age 83.  Hepatitis C blood test.** / For all people born from [birth year] through 1965 and any individual with known risks for hepatitis C.  Skin self-exam. / Monthly.  Influenza immunization.** / Every year.  Pneumococcal polysaccharide immunization.** / 1 to 2 doses if you smoke cigarettes or if you have certain chronic medical conditions.  Tetanus, diphtheria, pertussis (Tdap, Td) immunization.** / A one-time dose of Tdap vaccine. After that, you need a Td booster dose every 10 years.  Measles, mumps, rubella (MMR) immunization. / You need at least 1 dose of MMR if you were born in 1957 or later. You may also need a second dose.  Varicella immunization.** / Consult your caregiver.  Meningococcal immunization.** / Consult your caregiver.  Hepatitis A immunization.** / Consult your caregiver. 2 doses, 6 to 18 months  apart.  Hepatitis B immunization.** / Consult your caregiver. 3 doses, usually over 6 months. Ages 47 and over  Blood pressure check.** / Every 1 to 2 years.  Lipid and cholesterol check.** / Every 5 years beginning at age 79.  Clinical breast exam.** / Every year after age 25.  Mammogram.** / Every year beginning at age 59 and continuing for as long as you are in good health. Consult with your caregiver.  Pap test.** / Every 3 years starting at age 29 through age 55 or 62 with a 3 consecutive normal Pap tests. Testing can be stopped between 65 and 70 with 3 consecutive normal Pap tests and no abnormal Pap or HPV tests in the past 10 years.  HPV screening.** / Every 3 years from ages 24 through ages 52 or 82 with a history of 3 consecutive normal Pap tests. Testing can be stopped between 65 and 70 with 3 consecutive normal Pap tests and no abnormal Pap or HPV tests in the past 10 years.  Fecal occult blood test (FOBT) of stool. / Every year beginning at age 57 and continuing until age 85. You may not need to do this test if you get a colonoscopy every 10 years.  Flexible sigmoidoscopy or colonoscopy.** / Every 5 years for a flexible sigmoidoscopy or every 10 years for a colonoscopy beginning at age 56 and continuing until age 10.  Hepatitis C blood test.** / For all people born from [birth year] through 1965 and any individual with known risks for hepatitis C.  Osteoporosis screening.** / A one-time screening for women ages 33 and over and women at risk for fractures or osteoporosis.  Skin self-exam. / Monthly.  Influenza immunization.** / Every year.  Pneumococcal polysaccharide immunization.** / 1 dose at age 40 (or older) if you have never been vaccinated.  Tetanus, diphtheria, pertussis (Tdap, Td) immunization. / A one-time dose of Tdap vaccine if you are over 65 and have contact with an infant, are a Research scientist (physical sciences), or simply want to be protected from whooping cough. After that, you  need a Td booster dose every 10 years.  Varicella immunization.** / Consult your caregiver.  Meningococcal immunization.** / Consult your caregiver.  Hepatitis A immunization.** / Consult  your caregiver. 2 doses, 6 to 18 months apart.  Hepatitis B immunization.** / Check with your caregiver. 3 doses, usually over 6 months. ** Family history and personal history of risk and conditions may change your caregiver's recommendations. Document Released: 01/25/2002 Document Revised: 02/21/2012 Document Reviewed: 04/26/2011 [facility] Patient Information 2013 [place], [place].

## 2013-01-12 NOTE — Assessment & Plan Note (Signed)
Trying to quit 

## 2013-01-12 NOTE — Assessment & Plan Note (Signed)
Nml labs in 2012--will repeat at 3-5 years.

## 2013-01-12 NOTE — Assessment & Plan Note (Signed)
Kenalog as needed--advised Dove soap and moisterizer

## 2013-01-12 NOTE — Progress Notes (Signed)
  Subjective:    Patient ID: Rebecca Cuevas, female    DOB: 02-25-1961, 52 y.o.   MRN: [id number]  HPI Here for annual exam.  Reports itching in her ear that is bothersome.  Has h/o eczema in this ear.  She reports using a cream which helps. Needs mammogram and colonoscopy.  Declines flu and pneumonia shots. History reviewed. No pertinent past medical history. Past Surgical History  Procedure Date  . Abdominal hysterectomy   . Hernia repair   . Cesarean section    Family History  Problem Relation Age of Onset  . Hypertension Mother   . Diabetes Father    History   Social History  . Marital Status: Single    Spouse Name: N/A    Number of Children: N/A  . Years of Education: N/A   Occupational History  . Not on file.   Social History Main Topics  . Smoking status: Current Every Day Smoker  . Smokeless tobacco: Not on file     Comment: 8 cigs a day  . Alcohol Use: Not on file  . Drug Use: Yes    Special: Marijuana  . Sexually Active: Yes    Birth Control/ Protection: Surgical   Other Topics Concern  . Not on file   Social History Narrative  . No narrative on file   Current Outpatient Prescriptions on File Prior to Visit  Medication Sig Dispense Refill  . hydrochlorothiazide (HYDRODIURIL) 25 MG tablet Take 0.5 tablets (12.5 mg total) by mouth daily.  30 tablet  3   No Known Allergies    Review of Systems  Constitutional: Negative for fever and chills.  HENT: Negative for nosebleeds, congestion and rhinorrhea.   Eyes: Negative for visual disturbance.  Respiratory: Negative for chest tightness and shortness of breath.   Cardiovascular: Negative for chest pain.  Gastrointestinal: Negative for nausea, vomiting, abdominal pain, diarrhea, constipation, blood in stool and abdominal distention.  Genitourinary: Negative for dysuria, frequency, vaginal bleeding and menstrual problem.  Musculoskeletal: Negative for arthralgias.  Skin: Negative for rash.  Neurological:  Negative for dizziness and headaches.  Psychiatric/Behavioral: Negative for behavioral problems, sleep disturbance and dysphoric mood.  All other systems reviewed and are negative.       Objective:   Physical Exam  Constitutional: She is oriented to person, place, and time. She appears well-developed and well-nourished.  HENT:  Head: Normocephalic and atraumatic.  Right Ear: There is swelling.  Ears:  Eyes: Pupils are equal, round, and reactive to light. No scleral icterus.  Neck: Normal range of motion. No thyromegaly present.  Cardiovascular: Normal rate, regular rhythm and intact distal pulses.   Pulmonary/Chest: Effort normal and breath sounds normal. Right breast exhibits no inverted nipple, no mass, no skin change and no tenderness. Left breast exhibits no inverted nipple, no mass, no skin change and no tenderness. Breasts are symmetrical.  Abdominal: Soft. She exhibits no distension. There is no hepatomegaly. There is no tenderness. A hernia (umbilical) is present.  Genitourinary: Vagina normal. There is rash (Consistent with tinea) on the right labia. There is rash on the left labia. Cervix exhibits no discharge.       Uterus is surgically absent.  No masses  Neurological: She is alert and oriented to person, place, and time.  Skin: Skin is warm and dry.          Assessment & Plan:

## 2013-01-17 ENCOUNTER — Encounter: Payer: Self-pay | Admitting: Gastroenterology

## 2013-01-17 ENCOUNTER — Encounter: Payer: Self-pay | Admitting: *Deleted

## 2013-02-20 ENCOUNTER — Ambulatory Visit (AMBULATORY_SURGERY_CENTER): Payer: BC Managed Care – PPO | Admitting: *Deleted

## 2013-02-20 VITALS — Ht 66.0 in | Wt 200.8 lb

## 2013-02-20 DIAGNOSIS — Z1211 Encounter for screening for malignant neoplasm of colon: Secondary | ICD-10-CM

## 2013-02-20 MED ORDER — NA SULFATE-K SULFATE-MG SULF 17.5-3.13-1.6 GM/177ML PO SOLN: ORAL | Status: DC

## 2013-02-21 ENCOUNTER — Encounter: Payer: Self-pay | Admitting: Gastroenterology

## 2013-02-26 ENCOUNTER — Telehealth: Payer: Self-pay | Admitting: Gastroenterology

## 2013-02-26 NOTE — Telephone Encounter (Signed)
Pt will come by office and pick up sample of Suprep

## 2013-03-01 ENCOUNTER — Ambulatory Visit (AMBULATORY_SURGERY_CENTER): Payer: BC Managed Care – PPO | Admitting: Gastroenterology

## 2013-03-01 ENCOUNTER — Encounter: Payer: BC Managed Care – PPO | Admitting: Gastroenterology

## 2013-03-01 ENCOUNTER — Encounter: Payer: Self-pay | Admitting: Gastroenterology

## 2013-03-01 VITALS — BP 134/91 | HR 63 | Temp 97.9°F | Resp 37 | Ht 66.0 in | Wt 200.0 lb

## 2013-03-01 DIAGNOSIS — D126 Benign neoplasm of colon, unspecified: Secondary | ICD-10-CM

## 2013-03-01 DIAGNOSIS — Z1211 Encounter for screening for malignant neoplasm of colon: Secondary | ICD-10-CM

## 2013-03-01 MED ORDER — SODIUM CHLORIDE 0.9 % IV SOLN: 500.0000 mL | INTRAVENOUS | Status: DC

## 2013-03-01 NOTE — Progress Notes (Signed)
Report to pacu rn, vss, bbs=clear 

## 2013-03-01 NOTE — Progress Notes (Addendum)
Patient arrived in RR asking to go to the bathroom.  I explained to her that she could not get up for 1/2 hr.  She said that she did not want to use a bedpan.  Patient stated that she was "wet underneath" so I put a dry towel under her until she can get up and dried.  She said that she would "really like to "get up now."  Again, I explained the rules and offered her a bedpan again.  She refused.     Patient did not have preoperative order for IV antibiotic SSI prophylaxis. [phone])  Patient states that she is ready to go.  Started to take off leads.   AGAin told her that she had to stay 1/2 hour.   Patient refused bedpan again.  Patient arguing with me about the time she came in.   Stated that I had to go by the time on my computer.  She said that her time was more accurate.  Patient did not experience any of the following events: a burn prior to discharge; a fall within the facility; wrong site/side/patient/procedure/implant event; or a hospital transfer or hospital admission upon discharge from the facility. [phone])  Attempted to go over discharge instructions with the patient, but would argue with me concerning the instructions.  I gave them to her in the envelope.  I told her to read them over when she got home.  Also told her to callif she had any questions.   TRANSPORTER came back to tell me that the patient jumped into the car, and she decided to DRIVE HERSELF.  Patient drove away after we specifically told her not to drive after her procedure.

## 2013-03-01 NOTE — Patient Instructions (Addendum)
YOU HAD AN ENDOSCOPIC PROCEDURE TODAY AT THE [facility]: Refer to the procedure report that was given to you for any specific questions about what was found during the examination.  If the procedure report does not answer your questions, please call your gastroenterologist to clarify.  If you requested that your care partner not be given the details of your procedure findings, then the procedure report has been included in a sealed envelope for you to review at your convenience later.  YOU SHOULD EXPECT: Some feelings of bloating in the abdomen. Passage of more gas than usual.  Walking can help get rid of the air that was put into your GI tract during the procedure and reduce the bloating. If you had a lower endoscopy (such as a colonoscopy or flexible sigmoidoscopy) you may notice spotting of blood in your stool or on the toilet paper. If you underwent a bowel prep for your procedure, then you may not have a normal bowel movement for a few days.  DIET: Your first meal following the procedure should be a light meal and then it is ok to progress to your normal diet.  A half-sandwich or bowl of soup is an example of a good first meal.  Heavy or fried foods are harder to digest and may make you feel nauseous or bloated.  Likewise meals heavy in dairy and vegetables can cause extra gas to form and this can also increase the bloating.  Drink plenty of fluids but you should avoid alcoholic beverages for 24 hours.  ACTIVITY: Your care partner should take you home directly after the procedure.  You should plan to take it easy, moving slowly for the rest of the day.  You can resume normal activity the day after the procedure however you should NOT DRIVE or use heavy machinery for 24 hours (because of the sedation medicines used during the test).    SYMPTOMS TO REPORT IMMEDIATELY: A gastroenterologist can be reached at any hour.  During normal business hours, 8:30 AM to 5:00 PM Monday through Friday,  call [phone].  After hours and on weekends, please call the GI answering service at [phone] who will take a message and have the physician on call contact you.   Following lower endoscopy (colonoscopy or flexible sigmoidoscopy):  Excessive amounts of blood in the stool  Significant tenderness or worsening of abdominal pains  Swelling of the abdomen that is new, acute  Fever of 100F or higher    FOLLOW UP: If any biopsies were taken you will be contacted by phone or by letter within the next 1-3 weeks.  Call your gastroenterologist if you have not heard about the biopsies in 3 weeks.  Our staff will call the home number listed on your records the next business day following your procedure to check on you and address any questions or concerns that you may have at that time regarding the information given to you following your procedure. This is a courtesy call and so if there is no answer at the home number and we have not heard from you through the emergency physician on call, we will assume that you have returned to your regular daily activities without incident.  SIGNATURES/CONFIDENTIALITY: You and/or your care partner have signed paperwork which will be entered into your electronic medical record.  These signatures attest to the fact that that the information above on your After Visit Summary has been reviewed and is understood.  Full responsibility of the confidentiality   of this discharge information lies with you and/or your care-partner.     

## 2013-03-01 NOTE — Op Note (Signed)
[facility] [address] [place], [address]   COLONOSCOPY PROCEDURE REPORT  PATIENT: [name], [name]  MR#: [id number] BIRTHDATE: 1961-09-04 , 51  yrs. old GENDER: Female ENDOSCOPIST: Louis Meckel, MD REFERRED XB:JYNWG Pratt, M.D. PROCEDURE DATE:  03/01/2013 PROCEDURE:   Colonoscopy with snare polypectomy ASA CLASS:   Class II INDICATIONS:average risk screening. MEDICATIONS: MAC sedation, administered by CRNA and propofol (Diprivan) 200mg  IV  DESCRIPTION OF PROCEDURE:   After the risks benefits and alternatives of the procedure were thoroughly explained, informed consent was obtained.  A digital rectal exam revealed no abnormalities of the rectum.   The LB [id number]  endoscope was introduced through the anus and advanced to the cecum, which was identified by both the appendix and ileocecal valve. No adverse events experienced.   The quality of the prep was Suprep excellent The instrument was then slowly withdrawn as the colon was fully examined.      COLON FINDINGS: Two sessile polyps were found in the sigmoid colon. A polypectomy was performed with a cold snare.  The resection was complete and the polyp tissue was completely retrieved.   Mild diverticulosis was noted in the sigmoid colon.   The colon mucosa was otherwise normal.  Retroflexed views revealed no abnormalities. The time to cecum=3 minutes 07 seconds.  Withdrawal time=11 minutes 02 seconds.  The scope was withdrawn and the procedure completed. COMPLICATIONS: There were no complications.  ENDOSCOPIC IMPRESSION: 1.   Two sessile polyps were found in the sigmoid colon; polypectomy was performed with a cold snare 2.   Mild diverticulosis was noted in the sigmoid colon 3.   The colon mucosa was otherwise normal  RECOMMENDATIONS: If the polyp(s) removed today are proven to be adenomatous (pre-cancerous) polyps, you will need a repeat colonoscopy in 5 years.  Otherwise you should  continue to follow colorectal cancer screening guidelines for "routine risk" patients with colonoscopy in 10 years.  You will receive a letter within 1-2 weeks with the results of your biopsy as well as final recommendations.  Please call my office if you have not received a letter after 3 weeks.   eSigned:  Louis Meckel, MD 03/01/2013 9:09 AM   cc:   PATIENT NAME:  Rebecca, Cuevas MR#: [id number]

## 2013-03-02 ENCOUNTER — Telehealth: Payer: Self-pay | Admitting: *Deleted

## 2013-03-02 NOTE — Telephone Encounter (Signed)
Left message on number given in admitting to return call if problems or questions. ewm 

## 2013-03-08 ENCOUNTER — Encounter: Payer: Self-pay | Admitting: Gastroenterology

## 2013-08-06 ENCOUNTER — Encounter (HOSPITAL_COMMUNITY): Payer: Self-pay | Admitting: *Deleted

## 2013-08-06 ENCOUNTER — Emergency Department (HOSPITAL_COMMUNITY): Payer: BC Managed Care – PPO

## 2013-08-06 ENCOUNTER — Emergency Department (HOSPITAL_COMMUNITY)
Admission: EM | Admit: 2013-08-06 | Discharge: 2013-08-06 | Disposition: A | Discharge status: Home / Self Care | Payer: BC Managed Care – PPO | Attending: Emergency Medicine | Admitting: Emergency Medicine

## 2013-08-06 DIAGNOSIS — F17200 Nicotine dependence, unspecified, uncomplicated: Secondary | ICD-10-CM | POA: Insufficient documentation

## 2013-08-06 DIAGNOSIS — IMO0001 Reserved for inherently not codable concepts without codable children: Secondary | ICD-10-CM

## 2013-08-06 DIAGNOSIS — R079 Chest pain, unspecified: Secondary | ICD-10-CM

## 2013-08-06 DIAGNOSIS — I10 Essential (primary) hypertension: Secondary | ICD-10-CM | POA: Insufficient documentation

## 2013-08-06 DIAGNOSIS — R55 Syncope and collapse: Secondary | ICD-10-CM | POA: Insufficient documentation

## 2013-08-06 DIAGNOSIS — Z7982 Long term (current) use of aspirin: Secondary | ICD-10-CM | POA: Insufficient documentation

## 2013-08-06 DIAGNOSIS — R42 Dizziness and giddiness: Secondary | ICD-10-CM | POA: Insufficient documentation

## 2013-08-06 DIAGNOSIS — Z79899 Other long term (current) drug therapy: Secondary | ICD-10-CM | POA: Insufficient documentation

## 2013-08-06 LAB — COMPREHENSIVE METABOLIC PANEL
ALT: 8 U/L (ref 0–35)
AST: 15 U/L (ref 0–37)
Calcium: 9.4 mg/dL (ref 8.4–10.5)
Creatinine, Ser: 0.78 mg/dL (ref 0.50–1.10)
GFR calc Af Amer: >90 mL/min (ref >90)
Glucose, Bld: 111 mg/dL — ABNORMAL HIGH (ref 70–99)
Sodium: 140 mEq/L (ref 135–145)
Total Protein: 8 g/dL (ref 6.0–8.3)

## 2013-08-06 LAB — CBC
MCH: 28.3 pg (ref 26.0–34.0)
MCHC: 34.3 g/dL (ref 30.0–36.0)
MCV: 82.6 fL (ref 78.0–100.0)
Platelets: 258 10*3/uL (ref 150–400)

## 2013-08-06 LAB — POCT I-STAT TROPONIN I: Troponin i, poc: 0.01 ng/mL (ref 0.00–0.08)

## 2013-08-06 MED ORDER — HYDROCHLOROTHIAZIDE 25 MG PO TABS: 25.0000 mg | ORAL_TABLET | Freq: Every day | ORAL | Status: DC

## 2013-08-06 MED ORDER — ASPIRIN 81 MG PO CHEW: 81.0000 mg | CHEWABLE_TABLET | Freq: Every day | ORAL | Status: DC

## 2013-08-06 NOTE — ED Notes (Signed)
Patient here for dizziness, sts she did drink regular coffee this am and usually drinks decaf. Pt denies pain, sts epsidoe subsided,

## 2013-08-06 NOTE — ED Provider Notes (Signed)
CSN: [id number]     Arrival date & time 08/06/13  1214 History     First MD Initiated Contact with Patient 08/06/13 1246     Chief Complaint  Patient presents with  . Chest Pain   (Consider location/radiation/quality/duration/timing/severity/associated sxs/prior Treatment) HPI Comments: 52 year old female with a history of hypertension, states that she does not take medication because her blood pressure has been low when she takes it. She has no history of heart disease, no history of dissection or pulmonary embolism. She works as a Engineer, site and while she was in a patient's room she became lightheaded and dizzy. She states this was near syncope and not vertigo. She had similar symptoms last week and is a patient's room. After she developed these symptoms she then developed a chest heaviness in the left upper chest that radiated to the left shoulder.  These symptoms have essentially gone away at this point, she has no shortness of breath, no diaphoresis, she did take 325 mg of aspirin prior to arrival. She is very low risk for pulmonary embolism.  Patient is a 52 y.o. female presenting with chest pain. The history is provided by the patient.  Chest Pain   Past Medical History  Diagnosis Date  . Hypertension    Past Surgical History  Procedure Laterality Date  . Abdominal hysterectomy    . Hernia repair    . Cesarean section     Family History  Problem Relation Age of Onset  . Hypertension Mother   . Diabetes Father   . Colon cancer Neg Hx    History  Substance Use Topics  . Smoking status: Current Every Day Smoker -- 0.50 packs/day  . Smokeless tobacco: Never Used     Comment: 8 cigs a day  . Alcohol Use: 0.0 oz/week    0 Glasses of wine per week     Comment: occasional   OB History   Grav Para Term Preterm Abortions TAB SAB Ect Mult Living                 Review of Systems  Cardiovascular: Positive for chest pain.  All other systems reviewed and are  negative.    Allergies  Review of patient's allergies indicates no known allergies.  Home Medications   Current Outpatient Rx  Name  Route  Sig  Dispense  Refill  . nystatin cream (MYCOSTATIN)   Topical   Apply topically 2 (two) times daily.   30 g   3   . triamcinolone cream (KENALOG) 0.1 %   Topical   Apply topically 2 (two) times daily.   30 g   3   . aspirin 81 MG chewable tablet   Oral   Chew 1 tablet (81 mg total) by mouth daily.   30 tablet   1   . hydrochlorothiazide (HYDRODIURIL) 25 MG tablet   Oral   Take 1 tablet (25 mg total) by mouth daily.   30 tablet   3    BP 155/92  Pulse 65  Temp(Src) 98.2 F (36.8 C) (Oral)  Resp 22  Ht 5\' 6"  (1.676 m)  Wt 206 lb (93.441 kg)  BMI 33.27 kg/m2  SpO2 99% Physical Exam  Nursing note and vitals reviewed. Constitutional: She appears well-developed and well-nourished. No distress.  HENT:  Head: Normocephalic and atraumatic.  Mouth/Throat: Oropharynx is clear and moist. No oropharyngeal exudate.  Eyes: Conjunctivae and EOM are normal. Pupils are equal, round, and reactive to light. Right eye  exhibits no discharge. Left eye exhibits no discharge. No scleral icterus.  Neck: Normal range of motion. Neck supple. No JVD present. No thyromegaly present.  Cardiovascular: Normal rate, regular rhythm, normal heart sounds and intact distal pulses.  Exam reveals no gallop and no friction rub.   No murmur heard. Pulmonary/Chest: Effort normal and breath sounds normal. No respiratory distress. She has no wheezes. She has no rales.  Abdominal: Soft. Bowel sounds are normal. She exhibits no distension and no mass. There is no tenderness.  Musculoskeletal: Normal range of motion. She exhibits no edema and no tenderness.  Lymphadenopathy:    She has no cervical adenopathy.  Neurological: She is alert. Coordination normal.  Skin: Skin is warm and dry. No rash noted. No erythema.  Psychiatric: She has a normal mood and affect.  Her behavior is normal.    ED Course   Procedures (including critical care time)  Labs Reviewed  COMPREHENSIVE METABOLIC PANEL - Abnormal; Notable for the following:    Glucose, Bld 111 (*)    Total Bilirubin 0.1 (*)    All other components within normal limits  CBC  POCT I-STAT TROPONIN I  POCT I-STAT TROPONIN I   Dg Chest 2 View  08/06/2013   CLINICAL DATA:  Chest pain.  EXAM: CHEST  2 VIEW  COMPARISON:  10/23/2006  FINDINGS: The heart size and mediastinal contours are within normal limits. Both lungs are clear. The visualized skeletal structures are unremarkable.  IMPRESSION: No active cardiopulmonary disease.   Electronically Signed   By: Myles Rosenthal   On: 08/06/2013 12:58   1. Chest pain   2. Elevated blood pressure     MDM  At this time the patient has a normal cardiac exam, normal pulmonary exam and a normal EKG. She is low risk for cardiac disease other than her smoking history but would require followup with the cardiology office for further risk stratification stress testing or other testing as he necessary by the cardiologist. X-ray shows no acute findings, laboratory workup shows normal CBC, normal cooperative metabolic panel, troponin pending at this time.  ED ECG REPORT  I personally interpreted this EKG   Date: 08/06/2013   Rate: 95  Rhythm: normal sinus rhythm  QRS Axis: normal  Intervals: normal  ST/T Wave abnormalities: normal  Conduction Disutrbances:none  Narrative Interpretation:   Old EKG Reviewed: no old tracings  Pt has no symptoms at this time - she has normal ECG and normal troponin - she is low risk for cardiac chest pain - i will restart her HCTZ due to mild hypertension and recommend close cardiology follow up.  Meds given in ED:  Medications - No data to display  New Prescriptions   ASPIRIN 81 MG CHEWABLE TABLET    Chew 1 tablet (81 mg total) by mouth daily.   HCTZ 25mg  daily   Vida Roller, MD 08/06/13 [phone]

## 2013-08-06 NOTE — ED Notes (Signed)
Pt c/o dizziness while walking closely followed by L chest tightness that radiated to L shoulder.  States some sob denies nausea.  Took 325 asa.  Denies cardiac hx.

## 2013-08-09 ENCOUNTER — Encounter: Payer: BC Managed Care – PPO | Admitting: Family Medicine

## 2013-08-16 ENCOUNTER — Ambulatory Visit (INDEPENDENT_AMBULATORY_CARE_PROVIDER_SITE_OTHER): Payer: BC Managed Care – PPO | Admitting: Family Medicine

## 2013-08-16 ENCOUNTER — Encounter: Payer: Self-pay | Admitting: Family Medicine

## 2013-08-16 VITALS — BP 105/70 | HR 87 | Temp 97.9°F | Ht 66.0 in | Wt 200.3 lb

## 2013-08-16 DIAGNOSIS — I10 Essential (primary) hypertension: Secondary | ICD-10-CM

## 2013-08-16 MED ORDER — HYDROCHLOROTHIAZIDE 25 MG PO TABS: 12.5000 mg | ORAL_TABLET | Freq: Every day | ORAL | Status: DC

## 2013-08-16 NOTE — Patient Instructions (Signed)

## 2013-08-17 NOTE — Assessment & Plan Note (Signed)
Given BP is 105/70 on current regimen, trial of 1/2 tablet or 12.5 mg of HCTZ daily.  Check BP at home, if up or feels worse, resume current dose.  Also qod ASA.

## 2013-08-17 NOTE — Progress Notes (Signed)
  Subjective:    Patient ID: Rebecca Cuevas, female    DOB: September 02, 1961, 52 y.o.   MRN: [id number]  Hypertension Pertinent negatives include no chest pain or shortness of breath.    Seen in ED recently with elevated BP some chest pain and weakness, dizziness.  Has had this previously and has refused medication.  Started on HCTZ 25 mg and feels much better.  Also started on ASA daily.    Review of Systems  Constitutional: Negative for fever, chills and unexpected weight change.  Respiratory: Negative for shortness of breath.   Cardiovascular: Negative for chest pain and leg swelling.  Gastrointestinal: Negative for abdominal pain.  Genitourinary: Negative for dysuria.       Objective:   Physical Exam  Constitutional: She appears well-developed and well-nourished. No distress.  HENT:  Head: Normocephalic and atraumatic.  Eyes: No scleral icterus.  Neck: Neck supple.  Cardiovascular: Normal rate and regular rhythm.   Pulmonary/Chest: Effort normal.  Abdominal: Soft. There is no tenderness.          Assessment & Plan:

## 2014-04-29 ENCOUNTER — Encounter: Payer: Self-pay | Admitting: Family Medicine

## 2014-04-29 ENCOUNTER — Ambulatory Visit (INDEPENDENT_AMBULATORY_CARE_PROVIDER_SITE_OTHER): Payer: BC Managed Care – PPO | Admitting: Family Medicine

## 2014-04-29 VITALS — BP 119/82 | HR 76 | Temp 98.2°F | Wt 204.0 lb

## 2014-04-29 DIAGNOSIS — F17200 Nicotine dependence, unspecified, uncomplicated: Secondary | ICD-10-CM

## 2014-04-29 DIAGNOSIS — R000 Tachycardia, unspecified: Secondary | ICD-10-CM | POA: Insufficient documentation

## 2014-04-29 DIAGNOSIS — Z1239 Encounter for other screening for malignant neoplasm of breast: Secondary | ICD-10-CM

## 2014-04-29 DIAGNOSIS — I10 Essential (primary) hypertension: Secondary | ICD-10-CM

## 2014-04-29 LAB — CBC
HCT: 34.1 % — ABNORMAL LOW (ref 36.0–46.0)
Hemoglobin: 11.3 g/dL — ABNORMAL LOW (ref 12.0–15.0)
MCH: 27.2 pg (ref 26.0–34.0)
MCHC: 33.1 g/dL (ref 30.0–36.0)
MCV: 82 fL (ref 78.0–100.0)
PLATELETS: 278 10*3/uL (ref 150–400)
RBC: 4.16 MIL/uL (ref 3.87–5.11)
RDW: 14.8 % (ref 11.5–15.5)
WBC: 8 10*3/uL (ref 4.0–10.5)

## 2014-04-29 NOTE — Assessment & Plan Note (Signed)
BP is better off meds.

## 2014-04-29 NOTE — Assessment & Plan Note (Signed)
Needs scheduled mammogram

## 2014-04-29 NOTE — Progress Notes (Signed)
    Subjective:    Patient ID: Rebecca Cuevas is a 53 y.o. female presenting with Annual Exam and Hypertension  on 04/29/2014  HPI: Stopped her BP meds because they made her feel light-headed and dizzy. BP is good, she checks it at work and it is 120/92 at it's highest. BP ok here. Interested in smoking cessation. Complains of left knee pain in am with extreme flexion and some low back, right hip pain with movement. C/o fast heart rate. Last pap in 2014 and WNL - HR HPV--does not need another for 4 more years.  Review of Systems  Constitutional: Negative for fever, chills and unexpected weight change.  Respiratory: Negative for chest tightness and shortness of breath.   Cardiovascular: Positive for palpitations and leg swelling.  Gastrointestinal: Negative for nausea, vomiting, abdominal pain and constipation.      Objective:    BP 119/82  Pulse 76  Temp(Src) 98.2 F (36.8 C) (Oral)  Wt 204 lb (92.534 kg) Physical Exam  Vitals reviewed. Constitutional: She is oriented to person, place, and time. She appears well-developed and well-nourished. No distress.  HENT:  Head: Normocephalic and atraumatic.  Eyes: No scleral icterus.  Neck: Neck supple.  Cardiovascular: Normal rate and regular rhythm.   Pulmonary/Chest: Effort normal. No respiratory distress.  Abdominal: Soft. There is no tenderness.  Musculoskeletal: Normal range of motion. She exhibits edema (trace). She exhibits no tenderness.       Right knee: She exhibits normal range of motion, no swelling and no bony tenderness. No tenderness found.       Left knee: She exhibits normal range of motion, no swelling and no bony tenderness. No tenderness found.  Neurological: She is alert and oriented to person, place, and time.  Skin: Skin is warm. No rash noted.  Psychiatric: She has a normal mood and affect.        Assessment & Plan:  HYPERTENSION BP is better off meds.  TOBACCO DEPENDENCE Smoking cessation  re-inforced.  Screening for breast cancer Needs scheduled mammogram  Annual blood work NSAIDS prn   Return in about 3 months (around 07/30/2014) for a follow-up.

## 2014-04-29 NOTE — Patient Instructions (Addendum)
Smoking Cessation Quitting smoking is important to your health and has many advantages. However, it is not always easy to quit since nicotine is a very addictive drug. Often times, people try 3 times or more before being able to quit. This document explains the best ways for you to prepare to quit smoking. Quitting takes hard work and a lot of effort, but you can do it. ADVANTAGES OF QUITTING SMOKING  You will live longer, feel better, and live better.  Your body will feel the impact of quitting smoking almost immediately.  Within 20 minutes, blood pressure decreases. Your pulse returns to its normal level.  After 8 hours, carbon monoxide levels in the blood return to normal. Your oxygen level increases.  After 24 hours, the chance of having a heart attack starts to decrease. Your breath, hair, and body stop smelling like smoke.  After 48 hours, damaged nerve endings begin to recover. Your sense of taste and smell improve.  After 72 hours, the body is virtually free of nicotine. Your bronchial tubes relax and breathing becomes easier.  After 2 to 12 weeks, lungs can hold more air. Exercise becomes easier and circulation improves.  The risk of having a heart attack, stroke, cancer, or lung disease is greatly reduced.  After 1 year, the risk of coronary heart disease is cut in half.  After 5 years, the risk of stroke falls to the same as a nonsmoker.  After 10 years, the risk of lung cancer is cut in half and the risk of other cancers decreases significantly.  After 15 years, the risk of coronary heart disease drops, usually to the level of a nonsmoker.  If you are pregnant, quitting smoking will improve your chances of having a healthy baby.  The people you live with, especially any children, will be healthier.  You will have extra money to spend on things other than cigarettes. QUESTIONS TO THINK ABOUT BEFORE ATTEMPTING TO QUIT You may want to talk about your answers with your  caregiver.  Why do you want to quit?  If you tried to quit in the past, what helped and what did not?  What will be the most difficult situations for you after you quit? How will you plan to handle them?  Who can help you through the tough times? Your family? Friends? A caregiver?  What pleasures do you get from smoking? What ways can you still get pleasure if you quit? Here are some questions to ask your caregiver:  How can you help me to be successful at quitting?  What medicine do you think would be best for me and how should I take it?  What should I do if I need more help?  What is smoking withdrawal like? How can I get information on withdrawal? GET READY  Set a quit date.  Change your environment by getting rid of all cigarettes, ashtrays, matches, and lighters in your home, car, or work. Do not let people smoke in your home.  Review your past attempts to quit. Think about what worked and what did not. GET SUPPORT AND ENCOURAGEMENT You have a better chance of being successful if you have help. You can get support in many ways.  Tell your family, friends, and co-workers that you are going to quit and need their support. Ask them not to smoke around you.  Get individual, group, or telephone counseling and support. Programs are available at local hospitals and health centers. Call your local health department for   information about programs in your area.  Spiritual beliefs and practices may help some smokers quit.  Download a "quit meter" on your computer to keep track of quit statistics, such as how long you have gone without smoking, cigarettes not smoked, and money saved.  Get a self-help book about quitting smoking and staying off of tobacco. [place] yourself from urges to smoke. Talk to someone, go for a walk, or occupy your time with a task.  Change your normal routine. Take a different route to work. Drink tea instead of coffee.  Eat breakfast in a different place.  Reduce your stress. Take a hot bath, exercise, or read a book.  Plan something enjoyable to do every day. Reward yourself for not smoking.  Explore interactive web-based programs that specialize in helping you quit. GET MEDICINE AND USE IT CORRECTLY Medicines can help you stop smoking and decrease the urge to smoke. Combining medicine with the above behavioral methods and support can greatly increase your chances of successfully quitting smoking.  Nicotine replacement therapy helps deliver nicotine to your body without the negative effects and risks of smoking. Nicotine replacement therapy includes nicotine gum, lozenges, inhalers, nasal sprays, and skin patches. Some may be available over-the-counter and others require a prescription.  Antidepressant medicine helps people abstain from smoking, but how this works is unknown. This medicine is available by prescription.  Nicotinic receptor partial agonist medicine simulates the effect of nicotine in your brain. This medicine is available by prescription. Ask your caregiver for advice about which medicines to use and how to use them based on your health history. Your caregiver will tell you what side effects to look out for if you choose to be on a medicine or therapy. Carefully read the information on the package. Do not use any other product containing nicotine while using a nicotine replacement product.  RELAPSE OR DIFFICULT SITUATIONS Most relapses occur within the first 3 months after quitting. Do not be discouraged if you start smoking again. Remember, most people try several times before finally quitting. You may have symptoms of withdrawal because your body is used to nicotine. You may crave cigarettes, be irritable, feel very hungry, cough often, get headaches, or have difficulty concentrating. The withdrawal symptoms are only temporary. They are strongest when you first quit, but they will go away within  10 14 days. To reduce the chances of relapse, try to:  Avoid drinking alcohol. Drinking lowers your chances of successfully quitting.  Reduce the amount of caffeine you consume. Once you quit smoking, the amount of caffeine in your body increases and can give you symptoms, such as a rapid heartbeat, sweating, and anxiety.  Avoid smokers because they can make you want to smoke.  Do not let weight gain distract you. Many smokers will gain weight when they quit, usually less than 10 pounds. Eat a healthy diet and stay active. You can always lose the weight gained after you quit.  Find ways to improve your mood other than smoking. FOR MORE INFORMATION  www.smokefree.gov  Document Released: 11/23/2001 Document Revised: 05/30/2012 Document Reviewed: 03/09/2012 Calorie Counting Diet A calorie counting diet requires you to eat the number of calories that are right for you in a day. Calories are the measurement of how much energy you get from the food you eat. Eating the right amount of calories is important for staying at a healthy weight. If you eat too many calories, your body will store them as  fat and you may gain weight. If you eat too few calories, you may lose weight. Counting the number of calories you eat during a day will help you know if you are eating the right amount. A Registered Dietitian can determine how many calories you need in a day. The amount of calories needed varies from person to person. If your goal is to lose weight, you will need to eat fewer calories. Losing weight can benefit you if you are overweight or have health problems such as heart disease, high blood pressure, or diabetes. If your goal is to gain weight, you will need to eat more calories. Gaining weight may be necessary if you have a certain health problem that causes your body to need more energy. TIPS Whether you are increasing or decreasing the number of calories you eat during a day, it may be hard to get used to  changes in what you eat and drink. The following are tips to help you keep track of the number of calories you eat.  Measure foods at home with measuring cups. This helps you know the amount of food and number of calories you are eating.  Restaurants often serve food in amounts that are larger than 1 serving. While eating out, estimate how many servings of a food you are given. For example, a serving of cooked rice is  cup or about the size of half of a fist. Knowing serving sizes will help you be aware of how much food you are eating at restaurants.  Ask for smaller portion sizes or child-size portions at restaurants.  Plan to eat half of a meal at a restaurant. Take the rest home or share the other half with a friend.  Read the Nutrition Facts panel on food labels for calorie content and serving size. You can find out how many servings are in a package, the size of a serving, and the number of calories each serving has.  For example, a package might contain 3 cookies. The Nutrition Facts panel on that package says that 1 serving is 1 cookie. Below that, it will say there are 3 servings in the container. The calories section of the Nutrition Facts label says there are 90 calories. This means there are 90 calories in 1 cookie (1 serving). If you eat 1 cookie you have eaten 90 calories. If you eat all 3 cookies, you have eaten 270 calories (3 servings x 90 calories = 270 calories). The list below tells you how big or small some common portion sizes are.  1 oz.........4 stacked dice.  3 oz........Marland KitchenDeck of cards.  1 tsp.......Marland KitchenTip of little finger.  1 tbs......Marland KitchenMarland KitchenThumb.  2 tbs.......Marland KitchenGolf ball.   cup......Marland KitchenHalf of a fist.  1 cup.......Marland KitchenA fist. KEEP A FOOD LOG Write down every food item you eat, the amount you eat, and the number of calories in each food you eat during the day. At the end of the day, you can add up the total number of calories you have eaten. It may help to keep a list like  the one below. Find out the calorie information by reading the Nutrition Facts panel on food labels. Breakfast  Bran cereal (1 cup, 110 calories).  Fat-free milk ( cup, 45 calories). Snack  Apple (1 medium, 80 calories). Lunch  Spinach (1 cup, 20 calories).  Tomato ( medium, 20 calories).  Chicken breast strips (3 oz, 165 calories).  Shredded cheddar cheese ( cup, 110 calories).  Light [place] dressing (2 tbs,  60 calories).  Whole-wheat bread (1 slice, 80 calories).  Tub margarine (1 tsp, 35 calories).  Vegetable soup (1 cup, 160 calories). Dinner  Pork chop (3 oz, 190 calories).  Brown rice (1 cup, 215 calories).  Steamed broccoli ( cup, 20 calories).  Strawberries (1  cup, 65 calories).  Whipped cream (1 tbs, 50 calories). Daily Calorie Total: 5284 Document Released: 11/29/2005 Document Revised: 02/21/2012 Document Reviewed: 05/26/2007 [facility] Patient Information 2014 [place]. Exercise to Lose Weight Exercise and a healthy diet may help you lose weight. Your doctor may suggest specific exercises. EXERCISE IDEAS AND TIPS  Choose low-cost things you enjoy doing, such as walking, bicycling, or exercising to workout videos.  Take stairs instead of the elevator.  Walk during your lunch break.  Park your car further away from work or school.  Go to a gym or an exercise class.  Start with 5 to 10 minutes of exercise each day. Build up to 30 minutes of exercise 4 to 6 days a week.  Wear shoes with good support and comfortable clothes.  Stretch before and after working out.  Work out until you breathe harder and your heart beats faster.  Drink extra water when you exercise.  Do not do so much that you hurt yourself, feel dizzy, or get very short of breath. Exercises that burn about 150 calories:  Running 1  miles in 15 minutes.  Playing volleyball for 45 to 60 minutes.  Washing and waxing a car for 45 to 60 minutes.  Playing touch  football for 45 minutes.  Walking 1  miles in 35 minutes.  Pushing a stroller 1  miles in 30 minutes.  Playing basketball for 30 minutes.  Raking leaves for 30 minutes.  Bicycling 5 miles in 30 minutes.  Walking 2 miles in 30 minutes.  Dancing for 30 minutes.  Shoveling snow for 15 minutes.  Swimming laps for 20 minutes.  Walking up stairs for 15 minutes.  Bicycling 4 miles in 15 minutes.  Gardening for 30 to 45 minutes.  Jumping rope for 15 minutes.  Washing windows or floors for 45 to 60 minutes. Document Released: 01/01/2011 Document Revised: 02/21/2012 Document Reviewed: 01/01/2011 [facility] Patient Information 2014 [place], [place]. [employer] Patient Information 2014 [place].

## 2014-04-29 NOTE — Assessment & Plan Note (Signed)
Smoking cessation reinforced 

## 2014-04-30 ENCOUNTER — Encounter: Payer: Self-pay | Admitting: Family Medicine

## 2014-04-30 LAB — LIPID PANEL
CHOL/HDL RATIO: 3.7 ratio
CHOLESTEROL: 164 mg/dL (ref 0–200)
HDL: 44 mg/dL (ref >39)
LDL Cholesterol: 97 mg/dL (ref 0–99)
TRIGLYCERIDES: 113 mg/dL (ref <150)
VLDL: 23 mg/dL (ref 0–40)

## 2014-04-30 LAB — TSH: TSH: 1.816 u[IU]/mL (ref 0.350–4.500)

## 2014-10-07 ENCOUNTER — Emergency Department (HOSPITAL_COMMUNITY)
Admission: EM | Admit: 2014-10-07 | Discharge: 2014-10-07 | Disposition: A | Discharge status: Home / Self Care | Payer: BC Managed Care – PPO | Attending: Emergency Medicine | Admitting: Emergency Medicine

## 2014-10-07 ENCOUNTER — Encounter (HOSPITAL_COMMUNITY): Payer: Self-pay | Admitting: Emergency Medicine

## 2014-10-07 DIAGNOSIS — Z79899 Other long term (current) drug therapy: Secondary | ICD-10-CM | POA: Insufficient documentation

## 2014-10-07 DIAGNOSIS — S3992XA Unspecified injury of lower back, initial encounter: Secondary | ICD-10-CM | POA: Diagnosis present

## 2014-10-07 DIAGNOSIS — Y9289 Other specified places as the place of occurrence of the external cause: Secondary | ICD-10-CM | POA: Diagnosis not present

## 2014-10-07 DIAGNOSIS — Z7982 Long term (current) use of aspirin: Secondary | ICD-10-CM | POA: Insufficient documentation

## 2014-10-07 DIAGNOSIS — S39012A Strain of muscle, fascia and tendon of lower back, initial encounter: Secondary | ICD-10-CM | POA: Diagnosis not present

## 2014-10-07 DIAGNOSIS — X58XXXA Exposure to other specified factors, initial encounter: Secondary | ICD-10-CM | POA: Diagnosis not present

## 2014-10-07 DIAGNOSIS — Z720 Tobacco use: Secondary | ICD-10-CM | POA: Diagnosis not present

## 2014-10-07 DIAGNOSIS — I10 Essential (primary) hypertension: Secondary | ICD-10-CM | POA: Insufficient documentation

## 2014-10-07 DIAGNOSIS — Y93F2 Activity, caregiving, lifting: Secondary | ICD-10-CM | POA: Insufficient documentation

## 2014-10-07 DIAGNOSIS — Y990 Civilian activity done for income or pay: Secondary | ICD-10-CM | POA: Diagnosis not present

## 2014-10-07 MED ORDER — METHOCARBAMOL 500 MG PO TABS: 1000.0000 mg | ORAL_TABLET | Freq: Four times a day (QID) | ORAL | Status: DC | PRN

## 2014-10-07 MED ORDER — HYDROCODONE-ACETAMINOPHEN 5-325 MG PO TABS: ORAL_TABLET | ORAL | Status: DC

## 2014-10-07 MED ORDER — IBUPROFEN 400 MG PO TABS
800.0000 mg | ORAL_TABLET | Freq: Once | ORAL | Status: AC
  Administered 2014-10-07: 800 mg via ORAL
  Filled 2014-10-07: qty 2

## 2014-10-07 NOTE — ED Notes (Signed)
Per pt sts right lower back pain that started yesterday after sliding a pt up in bed.

## 2014-10-07 NOTE — Discharge Instructions (Signed)
Please take ibuprofen 400mg  (this is normally 2 over the counter pills) every 6 hours (take with food to minimze stomach irritation).   Take robaxin and/or Vicodin for breakthrough pain, do not drink alcohol, drive, care for children or perfom other critical tasks while taking robaxin and/or Vicodin .  Please follow with your primary care doctor in the next 2 days for a check-up. They must obtain records for further management.   Do not hesitate to return to the Emergency Department for any new, worsening or concerning symptoms.   Please follow with your primary care doctor in the next 2 days for a check-up. They must obtain records for further management.   Do not hesitate to return to the Emergency Department for any new, worsening or concerning symptoms.    Back Pain, Adult Low back pain is very common. About 1 in 5 people have back pain.The cause of low back pain is rarely dangerous. The pain often gets better over time.About half of people with a sudden onset of back pain feel better in just 2 weeks. About 8 in 10 people feel better by 6 weeks.  CAUSES Some common causes of back pain include:  Strain of the muscles or ligaments supporting the spine.  Wear and tear (degeneration) of the spinal discs.  Arthritis.  Direct injury to the back. DIAGNOSIS Most of the time, the direct cause of low back pain is not known.However, back pain can be treated effectively even when the exact cause of the pain is unknown.Answering your caregiver's questions about your overall health and symptoms is one of the most accurate ways to make sure the cause of your pain is not dangerous. If your caregiver needs more information, he or she may order lab work or imaging tests (X-rays or MRIs).However, even if imaging tests show changes in your back, this usually does not require surgery. HOME CARE INSTRUCTIONS For many people, back pain returns.Since low back pain is rarely dangerous, it is often a  condition that people can learn to [facility] their own.   Remain active. It is stressful on the back to sit or stand in one place. Do not sit, drive, or stand in one place for more than 30 minutes at a time. Take short walks on level surfaces as soon as pain allows.Try to increase the length of time you walk each day.  Do not stay in bed.Resting more than 1 or 2 days can delay your recovery.  Do not avoid exercise or work.Your body is made to move.It is not dangerous to be active, even though your back may hurt.Your back will likely heal faster if you return to being active before your pain is gone.  Pay attention to your body when you bend and lift. Many people have less discomfortwhen lifting if they bend their knees, keep the load close to their bodies,and avoid twisting. Often, the most comfortable positions are those that put less stress on your recovering back.  Find a comfortable position to sleep. Use a firm mattress and lie on your side with your knees slightly bent. If you lie on your back, put a pillow under your knees.  Only take over-the-counter or prescription medicines as directed by your caregiver. Over-the-counter medicines to reduce pain and inflammation are often the most helpful.Your caregiver may prescribe muscle relaxant drugs.These medicines help dull your pain so you can more quickly return to your normal activities and healthy exercise.  Put ice on the injured area.  Put ice in  a plastic bag.  Place a towel between your skin and the bag.  Leave the ice on for 15-20 minutes, 03-04 times a day for the first 2 to 3 days. After that, ice and heat may be alternated to reduce pain and spasms.  Ask your caregiver about trying back exercises and gentle massage. This may be of some benefit.  Avoid feeling anxious or stressed.Stress increases muscle tension and can worsen back pain.It is important to recognize when you are anxious or stressed and learn ways to  manage it.Exercise is a great option. SEEK MEDICAL CARE IF:  You have pain that is not relieved with rest or medicine.  You have pain that does not improve in 1 week.  You have new symptoms.  You are generally not feeling well. SEEK IMMEDIATE MEDICAL CARE IF:   You have pain that radiates from your back into your legs.  You develop new bowel or bladder control problems.  You have unusual weakness or numbness in your arms or legs.  You develop nausea or vomiting.  You develop abdominal pain.  You feel faint. Document Released: 11/29/2005 Document Revised: 05/30/2012 Document Reviewed: 04/02/2014 [facility] Patient Information 2015 [place], [place]. This information is not intended to replace advice given to you by your health care provider. Make sure you discuss any questions you have with your health care provider.

## 2014-10-07 NOTE — ED Provider Notes (Signed)
Medical screening examination/treatment/procedure(s) were performed by non-physician practitioner and as supervising physician I was immediately available for consultation/collaboration.   EKG Interpretation None       Orlie Dakin, MD 10/07/14 1724

## 2014-10-07 NOTE — ED Provider Notes (Signed)
CSN: [id number]     Arrival date & time 10/07/14  1126 History   First MD Initiated Contact with Patient 10/07/14 1354     Chief Complaint  Patient presents with  . Back Pain     (Consider location/radiation/quality/duration/timing/severity/associated sxs/prior Treatment) HPI  Rebecca Cuevas is a 53 y.o. 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 Patient has been taking Motrin at home with little relief. Rates her pain at 10 of 10 at its worst, she has fleeting twinges of severe pain. Denies fever, chills, change in bowel or bladder habits, h/o IDVU or cancer, numbness or weakness.    Past Medical History  Diagnosis Date  . Hypertension    Past Surgical History  Procedure Laterality Date  . Abdominal hysterectomy    . Hernia repair    . Cesarean section     Family History  Problem Relation Age of Onset  . Hypertension Mother   . Diabetes Father   . Colon cancer Neg Hx    History  Substance Use Topics  . Smoking status: Current Every Day Smoker -- 0.50 packs/day  . Smokeless tobacco: Never Used     Comment: 8 cigs a day  . Alcohol Use: 0.0 oz/week    0 Glasses of wine per week     Comment: occasional   OB History   Grav Para Term Preterm Abortions TAB SAB Ect Mult Living                 Review of Systems  10 systems reviewed and found to be negative, except as noted in the HPI.   Allergies  Review of patient's allergies indicates no known allergies.  Home Medications   Prior to Admission medications   Medication Sig Start Date End Date Taking? Authorizing Provider  aspirin 81 MG chewable tablet Chew 1 tablet (81 mg total) by mouth daily. 08/06/13   Johnna Acosta, MD  hydrochlorothiazide (HYDRODIURIL) 25 MG tablet Take 0.5 tablets (12.5 mg total) by mouth daily. 08/16/13 08/16/14  Donnamae Jude, MD  HYDROcodone-acetaminophen (NORCO/VICODIN) 5-325 MG per  tablet Take 1-2 tablets by mouth every 6 hours as needed for pain. 10/07/14   Amelia Macken, PA-C  methocarbamol (ROBAXIN) 500 MG tablet Take 2 tablets (1,000 mg total) by mouth 4 (four) times daily as needed (Pain). 10/07/14   Shynice Sigel, PA-C   BP 128/78  Pulse 84  Temp(Src) 97.9 F (36.6 C) (Oral)  Resp 20  Ht 5\' 6"  (1.676 m)  Wt 200 lb (90.719 kg)  BMI 32.30 kg/m2  SpO2 100% Physical Exam  Nursing note and vitals reviewed. Constitutional: She appears well-developed and well-nourished.  HENT:  Head: Normocephalic.  Eyes: Conjunctivae are normal.  Neck: Normal range of motion.  Cardiovascular: Normal rate, regular rhythm and intact distal pulses.   Pulmonary/Chest: Effort normal.  Abdominal: Soft. There is no tenderness.  Neurological: She is alert.  No point tenderness to percussion of lumbar spinal processes.  Positive right lumbar paraspinal spasm and tenderness to palpation. Strength is 5 out of 5 to bilateral lower extremities at hip and knee; extensor hallucis longus 5 out of 5. Ankle strength 5 out of 5, no clonus, neurovascularly intact. No saddle anaesthesia. Patellar reflexes are 2+ bilaterally.    Psychiatric: She has a normal mood and affect.    ED Course  Procedures (including critical care time) Labs  Review Labs Reviewed - No data to display  Imaging Review No results found.   EKG Interpretation None      MDM   Final diagnoses:  Lumbar strain, initial encounter    Filed Vitals:   10/07/14 1140  BP: 128/78  Pulse: 84  Temp: 97.9 F (36.6 C)  TempSrc: Oral  Resp: 20  Height: 5\' 6"  (1.676 m)  Weight: 200 lb (90.719 kg)  SpO2: 100%    Medications  ibuprofen (ADVIL,MOTRIN) tablet 800 mg (800 mg Oral Given 10/07/14 1329)    [name] is a 53 y.o. female presenting with  back pain.  No neurological deficits and normal neuro exam.  Patient can walk but states is painful.  No loss of bowel or bladder control.  No concern for cauda  equina.  No fever, night sweats, weight loss, h/o cancer, IVDU.  RICE protocol and pain medicine indicated and discussed with patient.  Choice of pain medication in the ED is limited by the fact the patient is driving home.  Evaluation does not show pathology that would require ongoing emergent intervention or inpatient treatment. Pt is hemodynamically stable and mentating appropriately. Discussed findings and plan with patient/guardian, who agrees with care plan. All questions answered. Return precautions discussed and outpatient follow up given.   New Prescriptions   HYDROCODONE-ACETAMINOPHEN (NORCO/VICODIN) 5-325 MG PER TABLET    Take 1-2 tablets by mouth every 6 hours as needed for pain.   METHOCARBAMOL (ROBAXIN) 500 MG TABLET    Take 2 tablets (1,000 mg total) by mouth 4 (four) times daily as needed (Pain).         Monico Blitz, PA-C 10/07/14 1410

## 2014-11-21 ENCOUNTER — Encounter: Payer: Self-pay | Admitting: Family Medicine

## 2014-11-21 ENCOUNTER — Ambulatory Visit (INDEPENDENT_AMBULATORY_CARE_PROVIDER_SITE_OTHER): Payer: BC Managed Care – PPO | Admitting: Family Medicine

## 2014-11-21 VITALS — BP 133/93 | HR 89 | Temp 98.6°F | Ht 66.0 in | Wt 202.0 lb

## 2014-11-21 DIAGNOSIS — Z1239 Encounter for other screening for malignant neoplasm of breast: Secondary | ICD-10-CM | POA: Diagnosis not present

## 2014-11-21 DIAGNOSIS — L309 Dermatitis, unspecified: Secondary | ICD-10-CM

## 2014-11-21 DIAGNOSIS — R358 Other polyuria: Secondary | ICD-10-CM | POA: Diagnosis not present

## 2014-11-21 DIAGNOSIS — R3589 Other polyuria: Secondary | ICD-10-CM | POA: Insufficient documentation

## 2014-11-21 DIAGNOSIS — M1990 Unspecified osteoarthritis, unspecified site: Secondary | ICD-10-CM | POA: Diagnosis not present

## 2014-11-21 LAB — POCT GLYCOSYLATED HEMOGLOBIN (HGB A1C): Hemoglobin A1C: 6.1

## 2014-11-21 MED ORDER — MELOXICAM 7.5 MG PO TABS: 7.5000 mg | ORAL_TABLET | Freq: Two times a day (BID) | ORAL | Status: DC

## 2014-11-21 MED ORDER — HYDROCORTISONE 1 % EX CREA: 1.0000 "application " | TOPICAL_CREAM | Freq: Every day | CUTANEOUS | Status: DC | PRN

## 2014-11-21 NOTE — Addendum Note (Signed)
Addended by: [place], Azavier Creson on: 11/21/2014 06:03 PM   Modules accepted: Orders

## 2014-11-21 NOTE — Patient Instructions (Signed)
Arthritis, Nonspecific Arthritis is inflammation of a joint. This usually means pain, redness, warmth or swelling are present. One or more joints may be involved. There are a number of types of arthritis. Your caregiver may not be able to tell what type of arthritis you have right away. CAUSES  The most common cause of arthritis is the wear and tear on the joint (osteoarthritis). This causes damage to the cartilage, which can break down over time. The knees, hips, back and neck are most often affected by this type of arthritis. Other types of arthritis and common causes of joint pain include:  Sprains and other injuries near the joint. Sometimes minor sprains and injuries cause pain and swelling that develop hours later.  Rheumatoid arthritis. This affects hands, feet and knees. It usually affects both sides of your body at the same time. It is often associated with chronic ailments, fever, weight loss and general weakness.  Crystal arthritis. Gout and pseudo gout can cause occasional acute severe pain, redness and swelling in the foot, ankle, or knee.  Infectious arthritis. Bacteria can get into a joint through a break in overlying skin. This can cause infection of the joint. Bacteria and viruses can also spread through the blood and affect your joints.  Drug, infectious and allergy reactions. Sometimes joints can become mildly painful and slightly swollen with these types of illnesses. SYMPTOMS   Pain is the main symptom.  Your joint or joints can also be red, swollen and warm or hot to the touch.  You may have a fever with certain types of arthritis, or even feel overall ill.  The joint with arthritis will hurt with movement. Stiffness is present with some types of arthritis. DIAGNOSIS  Your caregiver will suspect arthritis based on your description of your symptoms and on your exam. Testing may be needed to find the type of arthritis:  Blood and sometimes urine tests.  X-ray tests  and sometimes CT or MRI scans.  Removal of fluid from the joint (arthrocentesis) is done to check for bacteria, crystals or other causes. Your caregiver (or a specialist) will numb the area over the joint with a local anesthetic, and use a needle to remove joint fluid for examination. This procedure is only minimally uncomfortable.  Even with these tests, your caregiver may not be able to tell what kind of arthritis you have. Consultation with a specialist (rheumatologist) may be helpful. TREATMENT  Your caregiver will discuss with you treatment specific to your type of arthritis. If the specific type cannot be determined, then the following general recommendations may apply. Treatment of severe joint pain includes:  Rest.  Elevation.  Anti-inflammatory medication (for example, ibuprofen) may be prescribed. Avoiding activities that cause increased pain.  Only take over-the-counter or prescription medicines for pain and discomfort as recommended by your caregiver.  Cold packs over an inflamed joint may be used for 10 to 15 minutes every hour. Hot packs sometimes feel better, but do not use overnight. Do not use hot packs if you are diabetic without your caregiver's permission.  A cortisone shot into arthritic joints may help reduce pain and swelling.  Any acute arthritis that gets worse over the next 1 to 2 days needs to be looked at to be sure there is no joint infection. Long-term arthritis treatment involves modifying activities and lifestyle to reduce joint stress jarring. This can include weight loss. Also, exercise is needed to nourish the joint cartilage and remove waste. This helps keep the muscles   around the joint strong. HOME CARE INSTRUCTIONS   Do not take aspirin to relieve pain if gout is suspected. This elevates uric acid levels.  Only take over-the-counter or prescription medicines for pain, discomfort or fever as directed by your caregiver.  Rest the joint as much as  possible.  If your joint is swollen, keep it elevated.  Use crutches if the painful joint is in your leg.  Drinking plenty of fluids may help for certain types of arthritis.  Follow your caregiver's dietary instructions.  Try low-impact exercise such as:  Swimming.  Water aerobics.  Biking.  Walking.  Morning stiffness is often relieved by a warm shower.  Put your joints through regular range-of-motion. SEEK MEDICAL CARE IF:   You do not feel better in 24 hours or are getting worse.  You have side effects to medications, or are not getting better with treatment. SEEK IMMEDIATE MEDICAL CARE IF:   You have a fever.  You develop severe joint pain, swelling or redness.  Many joints are involved and become painful and swollen.  There is severe back pain and/or leg weakness.  You have loss of bowel or bladder control. Document Released: 01/06/2005 Document Revised: 02/21/2012 Document Reviewed: 01/22/2009 [facility] Patient Information 2015 [place], [place]. This information is not intended to replace advice given to you by your health care provider. Make sure you discuss any questions you have with your health care provider. Baker Cyst A Baker cyst is a sac-like structure that forms in the back of the knee. It is filled with the same fluid that is located in your knee. This fluid lubricates the bones and cartilage of the knee and allows them to move over each other more easily. CAUSES  When the knee becomes injured or inflamed, increased fluid forms in the knee. When this happens, the joint lining is pushed out behind the knee and forms the Baker cyst. This cyst may also be caused by inflammation from arthritic conditions and infections. SIGNS AND SYMPTOMS  A Baker cyst usually has no symptoms. When the cyst is substantially enlarged:  You may feel pressure behind the knee, stiffness in the knee, or a mass in the area behind the knee.  You may develop pain, redness, and  swelling in the calf. This can suggest a blood clot and requires evaluation by your health care provider. DIAGNOSIS  A Baker cyst is most often found during an ultrasound exam. This exam may have been performed for other reasons, and the cyst was found incidentally. Sometimes an MRI is used. This picks up other problems within a joint that an ultrasound exam may not. If the Baker cyst developed immediately after an injury, X-ray exams may be used to diagnose the cyst. TREATMENT  The treatment depends on the cause of the cyst. Anti-inflammatory medicines and rest often will be prescribed. If the cyst is caused by a bacterial infection, antibiotic medicines may be prescribed.  HOME CARE INSTRUCTIONS   If the cyst was caused by an injury, for the first 24 hours, keep the injured leg elevated on 2 pillows while lying down.  For the first 24 hours while you are awake, apply ice to the injured area:  Put ice in a plastic bag.  Place a towel between your skin and the bag.  Leave the ice on for 20 minutes, 2-3 times a day.  Only take over-the-counter or prescription medicines for pain, discomfort, or fever as directed by your health care provider.  Only take antibiotic  medicine as directed. Make sure to finish it even if you start to feel better. MAKE SURE YOU:   Understand these instructions.  Will watch your condition.  Will get help right away if you are not doing well or get worse. Document Released: 11/29/2005 Document Revised: 09/19/2013 Document Reviewed: 07/11/2013 [facility] Patient Information 2015 [place], [place]. This information is not intended to replace advice given to you by your health care provider. Make sure you discuss any questions you have with your health care provider.

## 2014-11-21 NOTE — Assessment & Plan Note (Signed)
Rule out DM

## 2014-11-21 NOTE — Assessment & Plan Note (Signed)
Check x-rays and treat with NSAIDS x 2 wks, take with food.

## 2014-11-21 NOTE — Progress Notes (Addendum)
    Subjective:    Patient ID: Rebecca Cuevas is a 53 y.o. 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 Was on light duty. Back to full duty. 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  Had x-rays. Notes pain is in both legs.  Hurts with rising. Notes swelling behind her right knee. Notes no pain except with rain.  Review of Systems  Constitutional: Negative for fever and chills.  Respiratory: Negative for shortness of breath.   Cardiovascular: Negative for chest pain.  Gastrointestinal: Negative for nausea, vomiting and abdominal pain.  Genitourinary: Negative for dysuria.  Skin: Negative for rash.      Objective:    BP 133/93 mmHg  Pulse 89  Temp(Src) 98.6 F (37 C) (Oral)  Ht 5\' 6"  (1.676 m)  Wt 202 lb (91.627 kg)  BMI 32.62 kg/m2 Physical Exam  Constitutional: She is oriented to person, place, and time. She appears well-developed and well-nourished. No distress.  HENT:  Head: Normocephalic and atraumatic.  Eyes: No scleral icterus.  Neck: Neck supple.  Cardiovascular: Normal rate.   Pulmonary/Chest: Effort normal. Right breast exhibits no inverted nipple, no mass and no nipple discharge. Left breast exhibits no inverted nipple, no mass and no nipple discharge.  Abdominal: Soft. She exhibits no mass. There is no tenderness.  Musculoskeletal:       Legs: Neurological: She is alert and oriented to person, place, and time.  Skin: Skin is warm and dry.  Psychiatric: She has a normal mood and affect.        Assessment & Plan:     Problem List Items Addressed This Visit      Unprioritized   Eczema    Med refill    Relevant Medications      hydrocortisone cream 1 %   Arthritis    Check x-rays and treat with NSAIDS x 2 wks, take with food.    Relevant Medications      meloxicam (MOBIC) tablet   Other Relevant Orders      DG Knee Bilateral Standing AP   Screening for breast cancer   Needs mammogram    Polyuria - Primary    Rule out DM    Relevant Orders      POCT CBG monitoring      Hemoglobin A1c     Return in about 6 months (around 05/23/2015).

## 2014-11-21 NOTE — Assessment & Plan Note (Signed)
Needs mammogram

## 2014-11-21 NOTE — Assessment & Plan Note (Signed)
Med refill

## 2014-11-22 ENCOUNTER — Ambulatory Visit (HOSPITAL_COMMUNITY)
Admission: RE | Admit: 2014-11-22 | Discharge: 2014-11-22 | Disposition: A | Discharge status: Home / Self Care | Payer: BC Managed Care – PPO | Source: Ambulatory Visit | Attending: Family Medicine | Admitting: Family Medicine

## 2014-11-22 ENCOUNTER — Encounter: Payer: Self-pay | Admitting: Family Medicine

## 2014-11-22 DIAGNOSIS — M25562 Pain in left knee: Secondary | ICD-10-CM | POA: Insufficient documentation

## 2014-11-22 DIAGNOSIS — M25561 Pain in right knee: Secondary | ICD-10-CM | POA: Diagnosis present

## 2014-11-22 DIAGNOSIS — M1990 Unspecified osteoarthritis, unspecified site: Secondary | ICD-10-CM

## 2014-11-25 ENCOUNTER — Encounter: Payer: Self-pay | Admitting: *Deleted

## 2015-02-04 ENCOUNTER — Ambulatory Visit (INDEPENDENT_AMBULATORY_CARE_PROVIDER_SITE_OTHER): Payer: BLUE CROSS/BLUE SHIELD | Admitting: Family Medicine

## 2015-02-04 ENCOUNTER — Encounter: Payer: Self-pay | Admitting: Family Medicine

## 2015-02-04 VITALS — BP 145/90 | HR 92 | Temp 98.3°F | Ht 66.0 in | Wt 196.0 lb

## 2015-02-04 DIAGNOSIS — R109 Unspecified abdominal pain: Secondary | ICD-10-CM | POA: Diagnosis not present

## 2015-02-04 DIAGNOSIS — M546 Pain in thoracic spine: Secondary | ICD-10-CM

## 2015-02-04 LAB — POCT URINALYSIS DIPSTICK
Bilirubin, UA: NEGATIVE
GLUCOSE UA: NEGATIVE
LEUKOCYTES UA: NEGATIVE
Nitrite, UA: NEGATIVE
PROTEIN UA: NEGATIVE
Spec Grav, UA: 1.025
UROBILINOGEN UA: 0.2
pH, UA: 6

## 2015-02-04 LAB — POCT UA - MICROSCOPIC ONLY

## 2015-02-04 NOTE — Patient Instructions (Signed)
Back Pain, Adult Low back pain is very common. About 1 in 5 people have back pain.The cause of low back pain is rarely dangerous. The pain often gets better over time.About half of people with a sudden onset of back pain feel better in just 2 weeks. About 8 in 10 people feel better by 6 weeks.  CAUSES Some common causes of back pain include:  Strain of the muscles or ligaments supporting the spine.  Wear and tear (degeneration) of the spinal discs.  Arthritis.  Direct injury to the back. DIAGNOSIS Most of the time, the direct cause of low back pain is not known.However, back pain can be treated effectively even when the exact cause of the pain is unknown.Answering your caregiver's questions about your overall health and symptoms is one of the most accurate ways to make sure the cause of your pain is not dangerous. If your caregiver needs more information, he or she may order lab work or imaging tests (X-rays or MRIs).However, even if imaging tests show changes in your back, this usually does not require surgery. HOME CARE INSTRUCTIONS For many people, back pain returns.Since low back pain is rarely dangerous, it is often a condition that people can learn to manageon their own.   Remain active. It is stressful on the back to sit or stand in one place. Do not sit, drive, or stand in one place for more than 30 minutes at a time. Take short walks on level surfaces as soon as pain allows.Try to increase the length of time you walk each day.  Do not stay in bed.Resting more than 1 or 2 days can delay your recovery.  Do not avoid exercise or work.Your body is made to move.It is not dangerous to be active, even though your back may hurt.Your back will likely heal faster if you return to being active before your pain is gone.  Pay attention to your body when you bend and lift. Many people have less discomfortwhen lifting if they bend their knees, keep the load close to their bodies,and  avoid twisting. Often, the most comfortable positions are those that put less stress on your recovering back.  Find a comfortable position to sleep. Use a firm mattress and lie on your side with your knees slightly bent. If you lie on your back, put a pillow under your knees.  Only take over-the-counter or prescription medicines as directed by your caregiver. Over-the-counter medicines to reduce pain and inflammation are often the most helpful.Your caregiver may prescribe muscle relaxant drugs.These medicines help dull your pain so you can more quickly return to your normal activities and healthy exercise.  Put ice on the injured area.  Put ice in a plastic bag.  Place a towel between your skin and the bag.  Leave the ice on for 15-20 minutes, 03-04 times a day for the first 2 to 3 days. After that, ice and heat may be alternated to reduce pain and spasms.  Ask your caregiver about trying back exercises and gentle massage. This may be of some benefit.  Avoid feeling anxious or stressed.Stress increases muscle tension and can worsen back pain.It is important to recognize when you are anxious or stressed and learn ways to manage it.Exercise is a great option. SEEK MEDICAL CARE IF:  You have pain that is not relieved with rest or medicine.  You have pain that does not improve in 1 week.  You have new symptoms.  You are generally not feeling well. SEEK   IMMEDIATE MEDICAL CARE IF:   You have pain that radiates from your back into your legs.  You develop new bowel or bladder control problems.  You have unusual weakness or numbness in your arms or legs.  You develop nausea or vomiting.  You develop abdominal pain.  You feel faint. Document Released: 11/29/2005 Document Revised: 05/30/2012 Document Reviewed: 04/02/2014 [employer] Patient Information 2015 [employer]. This information is not intended to replace advice given to you by your health care provider. Make sure you  discuss any questions you have with your health care provider.  

## 2015-02-04 NOTE — Progress Notes (Signed)
    Subjective:    Patient ID: Rebecca Cuevas is a 54 y.o. female presenting with Flank Pain  on 02/04/2015  HPI: Returns with flank pain on right.  Notes it is worse with movement. Had trial of Mobic and robaxin, but no improvement. Noted to have spurs on back with previous injury. Noted to begin after hurt self on job, she turned a patient and noted something pull.  Pain is ongoing x 3 months.  Pain is worse at night while recumbent and turning on the side.  Review of Systems  Constitutional: Negative for fever and chills.  Respiratory: Negative for shortness of breath.   Cardiovascular: Negative for chest pain.  Gastrointestinal: Negative for nausea, vomiting and abdominal pain.  Genitourinary: Negative for dysuria.  Skin: Negative for rash.      Objective:    BP 145/90 mmHg  Pulse 92  Temp(Src) 98.3 F (36.8 C) (Oral)  Ht 5\' 6"  (1.676 m)  Wt 196 lb (88.905 kg)  BMI 31.65 kg/m2 Physical Exam  Constitutional: She is oriented to person, place, and time. She appears well-developed and well-nourished. No distress.  HENT:  Head: Normocephalic and atraumatic.  Eyes: No scleral icterus.  Neck: Neck supple.  Cardiovascular: Normal rate.   Pulmonary/Chest: Effort normal.  Abdominal: Soft.  Neurological: She is alert and oriented to person, place, and time.  Skin: Skin is warm and dry.  Psychiatric: She has a normal mood and affect.   Urinalysis shows small blood only     Assessment & Plan:   Problem List Items Addressed This Visit    None    Visit Diagnoses    Right-sided thoracic back pain    -  Primary    Unclear etiology, but happened post work injury with pop.  Starts in back and radiates to front. ? disk or bone spurs with some nerve compression. Check MRI.    Relevant Medications    ibuprofen (ADVIL,MOTRIN) 200 MG tablet    Other Relevant Orders    MR Thoracic Spine Wo Contrast    Right sided abdominal pain        Relevant Orders    POCT urinalysis dipstick        Return in about 3 months (around 05/05/2015).

## 2015-04-08 ENCOUNTER — Ambulatory Visit
Admission: RE | Admit: 2015-04-08 | Discharge: 2015-04-08 | Disposition: A | Discharge status: Home / Self Care | Payer: BLUE CROSS/BLUE SHIELD | Source: Ambulatory Visit | Attending: Family Medicine | Admitting: Family Medicine

## 2015-04-08 DIAGNOSIS — M546 Pain in thoracic spine: Secondary | ICD-10-CM

## 2015-04-09 ENCOUNTER — Encounter: Payer: Self-pay | Admitting: *Deleted

## 2015-04-09 NOTE — Progress Notes (Signed)
Letter mailed to patient. Rebecca Cuevas,CMA  

## 2015-10-03 ENCOUNTER — Ambulatory Visit (INDEPENDENT_AMBULATORY_CARE_PROVIDER_SITE_OTHER): Payer: BLUE CROSS/BLUE SHIELD | Admitting: Family Medicine

## 2015-10-03 ENCOUNTER — Encounter: Payer: Self-pay | Admitting: Family Medicine

## 2015-10-03 VITALS — BP 127/84 | HR 80 | Temp 98.3°F | Wt 200.0 lb

## 2015-10-03 DIAGNOSIS — Z1239 Encounter for other screening for malignant neoplasm of breast: Secondary | ICD-10-CM

## 2015-10-03 DIAGNOSIS — I10 Essential (primary) hypertension: Secondary | ICD-10-CM | POA: Diagnosis not present

## 2015-10-03 DIAGNOSIS — L84 Corns and callosities: Secondary | ICD-10-CM | POA: Diagnosis not present

## 2015-10-03 DIAGNOSIS — Z0000 Encounter for general adult medical examination without abnormal findings: Secondary | ICD-10-CM | POA: Diagnosis not present

## 2015-10-03 LAB — CBC
HEMATOCRIT: 38.1 % (ref 36.0–46.0)
Hemoglobin: 13 g/dL (ref 12.0–15.0)
MCH: 28.1 pg (ref 26.0–34.0)
MCHC: 34.1 g/dL (ref 30.0–36.0)
MCV: 82.5 fL (ref 78.0–100.0)
MPV: 10.7 fL (ref 8.6–12.4)
Platelets: 222 10*3/uL (ref 150–400)
RBC: 4.62 MIL/uL (ref 3.87–5.11)
RDW: 14.6 % (ref 11.5–15.5)
WBC: 8.1 10*3/uL (ref 4.0–10.5)

## 2015-10-03 LAB — LIPID PANEL
CHOLESTEROL: 187 mg/dL (ref 125–200)
HDL: 44 mg/dL — AB (ref >=46)
LDL Cholesterol: 111 mg/dL (ref <130)
TRIGLYCERIDES: 162 mg/dL — AB (ref <150)
Total CHOL/HDL Ratio: 4.3 Ratio (ref <=5.0)
VLDL: 32 mg/dL — AB (ref <30)

## 2015-10-03 LAB — COMPREHENSIVE METABOLIC PANEL
ALK PHOS: 74 U/L (ref 33–130)
ALT: 10 U/L (ref 6–29)
AST: 13 U/L (ref 10–35)
Albumin: 3.9 g/dL (ref 3.6–5.1)
BUN: 9 mg/dL (ref 7–25)
CALCIUM: 9.1 mg/dL (ref 8.6–10.4)
CO2: 26 mmol/L (ref 20–31)
Chloride: 106 mmol/L (ref 98–110)
Creat: 0.72 mg/dL (ref 0.50–1.05)
Glucose, Bld: 97 mg/dL (ref 65–99)
POTASSIUM: 4.1 mmol/L (ref 3.5–5.3)
Sodium: 139 mmol/L (ref 135–146)
TOTAL PROTEIN: 7.4 g/dL (ref 6.1–8.1)
Total Bilirubin: 0.4 mg/dL (ref 0.2–1.2)

## 2015-10-03 LAB — TSH: TSH: 1.012 u[IU]/mL (ref 0.350–4.500)

## 2015-10-03 NOTE — Progress Notes (Signed)
    Subjective:    Patient ID: Rebecca Cuevas is a 54 y.o. female presenting with Hypertension and blisters  on 10/03/2015  HPI: Reports feeling bad yesterday with BP up at 153/83. She did have pork. She declines flu shot today. Willing to get mammogram if can be done today. Needs annual blood work. Reports blisters on feet and mostly callouses.  Review of Systems  Constitutional: Negative for fever and chills.  Respiratory: Negative for shortness of breath.   Cardiovascular: Negative for chest pain.  Gastrointestinal: Negative for nausea, vomiting and abdominal pain.  Genitourinary: Negative for dysuria.  Skin: Negative for rash.      Objective:   Physical Exam  Constitutional: She is oriented to person, place, and time. She appears well-developed and well-nourished. No distress.  HENT:  Head: Normocephalic and atraumatic.  Eyes: No scleral icterus.  Neck: Neck supple.  Cardiovascular: Normal rate.   Pulmonary/Chest: Effort normal.  Abdominal: Soft.  Neurological: She is alert and oriented to person, place, and time.  Skin: Skin is warm and dry.  Psychiatric: She has a normal mood and affect.        Assessment & Plan:   Problem List Items Addressed This Visit      Unprioritized   Essential hypertension    Has been on and off meds in the past--will hold meds as BP is fine today and encourage dietary and exercise interventions.      Relevant Orders   Comprehensive metabolic panel   Lipid panel    Other Visit Diagnoses    Screening for breast cancer    -  Primary    Relevant Orders    MM DIGITAL SCREENING BILATERAL    Annual physical exam        Relevant Orders    CBC    TSH    HIV antibody    Hepatitis C antibody    Pre-ulcerative corn or callous        change shoes--see podiatry if needed.       Return in about 3 months (around 01/03/2016) for a follow-up.  Rebecca Cuevas 10/03/2015 10:24 AM

## 2015-10-03 NOTE — Patient Instructions (Signed)
Hypertension Hypertension, commonly called high blood pressure, is when the force of blood pumping through your arteries is too strong. Your arteries are the blood vessels that carry blood from your heart throughout your body. A blood pressure reading consists of a higher number over a lower number, such as 110/72. The higher number (systolic) is the pressure inside your arteries when your heart pumps. The lower number (diastolic) is the pressure inside your arteries when your heart relaxes. Ideally you want your blood pressure below 120/80. Hypertension forces your heart to work harder to pump blood. Your arteries may become narrow or stiff. Having untreated or uncontrolled hypertension can cause heart attack, stroke, kidney disease, and other problems. RISK FACTORS Some risk factors for high blood pressure are controllable. Others are not.  Risk factors you cannot control include:   Race. You may be at higher risk if you are African American.  Age. Risk increases with age.  Gender. Men are at higher risk than women before age 45 years. After age 65, women are at higher risk than men. Risk factors you can control include:  Not getting enough exercise or physical activity.  Being overweight.  Getting too much fat, sugar, calories, or salt in your diet.  Drinking too much alcohol. SIGNS AND SYMPTOMS Hypertension does not usually cause signs or symptoms. Extremely high blood pressure (hypertensive crisis) may cause headache, anxiety, shortness of breath, and nosebleed. DIAGNOSIS To check if you have hypertension, your health care provider will measure your blood pressure while you are seated, with your arm held at the level of your heart. It should be measured at least twice using the same arm. Certain conditions can cause a difference in blood pressure between your right and left arms. A blood pressure reading that is higher than normal on one occasion does not mean that you need treatment. If  it is not clear whether you have high blood pressure, you may be asked to return on a different day to have your blood pressure checked again. Or, you may be asked to monitor your blood pressure at home for 1 or more weeks. TREATMENT Treating high blood pressure includes making lifestyle changes and possibly taking medicine. Living a healthy lifestyle can help lower high blood pressure. You may need to change some of your habits. Lifestyle changes may include:  Following the DASH diet. This diet is high in fruits, vegetables, and whole grains. It is low in salt, red meat, and added sugars.  Keep your sodium intake below 2,300 mg per day.  Getting at least 30-45 minutes of aerobic exercise at least 4 times per week.  Losing weight if necessary.  Not smoking.  Limiting alcoholic beverages.  Learning ways to reduce stress. Your health care provider may prescribe medicine if lifestyle changes are not enough to get your blood pressure under control, and if one of the following is true:  You are 18-59 years of age and your systolic blood pressure is above 140.  You are 60 years of age or older, and your systolic blood pressure is above 150.  Your diastolic blood pressure is above 90.  You have diabetes, and your systolic blood pressure is over 140 or your diastolic blood pressure is over 90.  You have kidney disease and your blood pressure is above 140/90.  You have heart disease and your blood pressure is above 140/90. Your personal target blood pressure may vary depending on your medical conditions, your age, and other factors. HOME CARE INSTRUCTIONS    Have your blood pressure rechecked as directed by your health care provider.   Take medicines only as directed by your health care provider. Follow the directions carefully. Blood pressure medicines must be taken as prescribed. The medicine does not work as well when you skip doses. Skipping doses also puts you at risk for  problems.  Do not smoke.   Monitor your blood pressure at home as directed by your health care provider. SEEK MEDICAL CARE IF:   You think you are having a reaction to medicines taken.  You have recurrent headaches or feel dizzy.  You have swelling in your ankles.  You have trouble with your vision. SEEK IMMEDIATE MEDICAL CARE IF:  You develop a severe headache or confusion.  You have unusual weakness, numbness, or feel faint.  You have severe chest or abdominal pain.  You vomit repeatedly.  You have trouble breathing. MAKE SURE YOU:   Understand these instructions.  Will watch your condition.  Will get help right away if you are not doing well or get worse.   This information is not intended to replace advice given to you by your health care provider. Make sure you discuss any questions you have with your health care provider.   Document Released: 11/29/2005 Document Revised: 04/15/2015 Document Reviewed: 09/21/2013 Elsevier Interactive Patient Education 2016 [employer] DASH Eating Plan DASH stands for "Dietary Approaches to Stop Hypertension." The DASH eating plan is a healthy eating plan that has been shown to reduce high blood pressure (hypertension). Additional health benefits may include reducing the risk of type 2 diabetes mellitus, heart disease, and stroke. The DASH eating plan may also help with weight loss. WHAT DO I NEED TO KNOW ABOUT THE DASH EATING PLAN? For the DASH eating plan, you will follow these general guidelines:  Choose foods with a percent daily value for sodium of less than 5% (as listed on the food label).  Use salt-free seasonings or herbs instead of table salt or sea salt.  Check with your health care provider or pharmacist before using salt substitutes.  Eat lower-sodium products, often labeled as "lower sodium" or "no salt added."  Eat fresh foods.  Eat more vegetables, fruits, and low-fat dairy products.  Choose whole grains.  Look for the word "whole" as the first word in the ingredient list.  Choose fish and skinless chicken or turkey more often than red meat. Limit fish, poultry, and meat to 6 oz (170 g) each day.  Limit sweets, desserts, sugars, and sugary drinks.  Choose heart-healthy fats.  Limit cheese to 1 oz (28 g) per day.  Eat more home-cooked food and less restaurant, buffet, and fast food.  Limit fried foods.  Cook foods using methods other than frying.  Limit canned vegetables. If you do use them, rinse them well to decrease the sodium.  When eating at a restaurant, ask that your food be prepared with less salt, or no salt if possible. WHAT FOODS CAN I EAT? Seek help from a dietitian for individual calorie needs. Grains Whole grain or whole wheat bread. Brown rice. Whole grain or whole wheat pasta. Quinoa, bulgur, and whole grain cereals. Low-sodium cereals. Corn or whole wheat flour tortillas. Whole grain cornbread. Whole grain crackers. Low-sodium crackers. Vegetables Fresh or frozen vegetables (raw, steamed, roasted, or grilled). Low-sodium or reduced-sodium tomato and vegetable juices. Low-sodium or reduced-sodium tomato sauce and paste. Low-sodium or reduced-sodium canned vegetables.  Fruits All fresh, canned (in natural juice), or frozen fruits. Meat and Other   Protein Products Ground beef (85% or leaner), grass-fed beef, or beef trimmed of fat. Skinless chicken or turkey. Ground chicken or turkey. Pork trimmed of fat. All fish and seafood. Eggs. Dried beans, peas, or lentils. Unsalted nuts and seeds. Unsalted canned beans. Dairy Low-fat dairy products, such as skim or 1% milk, 2% or reduced-fat cheeses, low-fat ricotta or cottage cheese, or plain low-fat yogurt. Low-sodium or reduced-sodium cheeses. Fats and Oils Tub margarines without trans fats. Light or reduced-fat mayonnaise and salad dressings (reduced sodium). Avocado. Safflower, olive, or canola oils. Natural peanut or almond  butter. Other Unsalted popcorn and pretzels. The items listed above may not be a complete list of recommended foods or beverages. Contact your dietitian for more options. WHAT FOODS ARE NOT RECOMMENDED? Grains White bread. White pasta. White rice. Refined cornbread. Bagels and croissants. Crackers that contain trans fat. Vegetables Creamed or fried vegetables. Vegetables in a cheese sauce. Regular canned vegetables. Regular canned tomato sauce and paste. Regular tomato and vegetable juices. Fruits Dried fruits. Canned fruit in light or heavy syrup. Fruit juice. Meat and Other Protein Products Fatty cuts of meat. Ribs, chicken wings, bacon, sausage, bologna, salami, chitterlings, fatback, hot dogs, bratwurst, and packaged luncheon meats. Salted nuts and seeds. Canned beans with salt. Dairy Whole or 2% milk, cream, half-and-half, and cream cheese. Whole-fat or sweetened yogurt. Full-fat cheeses or blue cheese. Nondairy creamers and whipped toppings. Processed cheese, cheese spreads, or cheese curds. Condiments Onion and garlic salt, seasoned salt, table salt, and sea salt. Canned and packaged gravies. Worcestershire sauce. Tartar sauce. Barbecue sauce. Teriyaki sauce. Soy sauce, including reduced sodium. Steak sauce. Fish sauce. Oyster sauce. Cocktail sauce. Horseradish. Ketchup and mustard. Meat flavorings and tenderizers. Bouillon cubes. Hot sauce. Tabasco sauce. Marinades. Taco seasonings. Relishes. Fats and Oils Butter, stick margarine, lard, shortening, ghee, and bacon fat. Coconut, palm kernel, or palm oils. Regular salad dressings. Other Pickles and olives. Salted popcorn and pretzels. The items listed above may not be a complete list of foods and beverages to avoid. Contact your dietitian for more information. WHERE CAN I FIND MORE INFORMATION? National [employer], [employer], [employer]: www.nhlbi.nih.gov/health/health-topics/topics/dash/   This information is not intended to replace  advice given to you by your health care provider. Make sure you discuss any questions you have with your health care provider.   Document Released: 11/18/2011 Document Revised: 12/20/2014 Document Reviewed: 10/03/2013 Elsevier Interactive Patient Education 2016 [employer] Inc[employer]  

## 2015-10-03 NOTE — Assessment & Plan Note (Signed)
Has been on and off meds in the past--will hold meds as BP is fine today and encourage dietary and exercise interventions.

## 2015-10-04 ENCOUNTER — Encounter: Payer: Self-pay | Admitting: Family Medicine

## 2015-10-04 LAB — HEPATITIS C ANTIBODY: HCV Ab: NEGATIVE

## 2015-10-04 LAB — HIV ANTIBODY (ROUTINE TESTING W REFLEX): HIV: NONREACTIVE

## 2016-01-14 ENCOUNTER — Telehealth: Payer: Self-pay | Admitting: *Deleted

## 2016-01-14 NOTE — Telephone Encounter (Signed)
Pt informed

## 2016-01-14 NOTE — Telephone Encounter (Signed)
LM for patient with female.  When/if patient calls back please inform her that she is due for her screening mammogram.  She can contact the [facility] at [phone] to schedule this.  Pcp has already ordered it. Jazmin Hartsell,CMA

## 2016-01-15 ENCOUNTER — Other Ambulatory Visit: Payer: Self-pay

## 2016-01-15 DIAGNOSIS — Z1231 Encounter for screening mammogram for malignant neoplasm of breast: Secondary | ICD-10-CM

## 2016-01-27 ENCOUNTER — Ambulatory Visit: Payer: BLUE CROSS/BLUE SHIELD

## 2016-01-29 ENCOUNTER — Ambulatory Visit
Admission: RE | Admit: 2016-01-29 | Discharge: 2016-01-29 | Disposition: A | Discharge status: Home / Self Care | Payer: BLUE CROSS/BLUE SHIELD | Source: Ambulatory Visit

## 2016-01-29 DIAGNOSIS — Z1231 Encounter for screening mammogram for malignant neoplasm of breast: Secondary | ICD-10-CM

## 2016-02-02 ENCOUNTER — Other Ambulatory Visit: Payer: Self-pay | Admitting: Family Medicine

## 2016-02-02 DIAGNOSIS — R928 Other abnormal and inconclusive findings on diagnostic imaging of breast: Secondary | ICD-10-CM

## 2016-02-04 ENCOUNTER — Telehealth: Payer: Self-pay | Admitting: *Deleted

## 2016-02-04 NOTE — Telephone Encounter (Signed)
Spoke with the breast center and they have contacted patient already to come in for an appt and they are waiting for patient to return their call.  They will be faxing over an order today to be signed by pcp for the additional imaging.  I will make pcp aware.  Breast center is aware that pcp is not in clinic until tomorrow. Percilla Tweten,CMA

## 2016-02-04 NOTE — Telephone Encounter (Signed)
-----   Message from Donnamae Jude, MD sent at 02/03/2016  2:58 PM EST ----- Yes, but don't scare her. Just say they need some more imaging and make sure she is scheduled. ----- Message -----    From: Valerie Roys, CMA    Sent: 02/03/2016   2:07 PM      To: Donnamae Jude, MD  Do we need to inform patient of her results?  There is no appt as of today but her results said that the imaging center would contact her regarding results and follow up imaging. Jazmin Hartsell,CMA  ----- Message -----    From: Donnamae Jude, MD    Sent: 02/02/2016   8:06 AM      To: [facility]  Can you ensure she is scheduled for this?

## 2016-02-05 ENCOUNTER — Other Ambulatory Visit (HOSPITAL_COMMUNITY)
Admission: RE | Admit: 2016-02-05 | Discharge: 2016-02-05 | Disposition: A | Discharge status: Home / Self Care | Payer: BLUE CROSS/BLUE SHIELD | Source: Ambulatory Visit | Attending: Family Medicine | Admitting: Family Medicine

## 2016-02-05 ENCOUNTER — Encounter: Payer: Self-pay | Admitting: Family Medicine

## 2016-02-05 ENCOUNTER — Ambulatory Visit (INDEPENDENT_AMBULATORY_CARE_PROVIDER_SITE_OTHER): Payer: BLUE CROSS/BLUE SHIELD | Admitting: Family Medicine

## 2016-02-05 VITALS — BP 132/84 | HR 99 | Temp 98.4°F | Ht 66.0 in | Wt 210.6 lb

## 2016-02-05 DIAGNOSIS — Z1151 Encounter for screening for human papillomavirus (HPV): Secondary | ICD-10-CM | POA: Insufficient documentation

## 2016-02-05 DIAGNOSIS — Z124 Encounter for screening for malignant neoplasm of cervix: Secondary | ICD-10-CM

## 2016-02-05 DIAGNOSIS — Z01419 Encounter for gynecological examination (general) (routine) without abnormal findings: Secondary | ICD-10-CM | POA: Diagnosis present

## 2016-02-05 NOTE — Patient Instructions (Signed)
Preventive Care for Adults, Female A healthy lifestyle and preventive care can promote health and wellness. Preventive health guidelines for women include the following key practices.  A routine yearly physical is a good way to check with your health care provider about your health and preventive screening. It is a chance to share any concerns and updates on your health and to receive a thorough exam.  Visit your dentist for a routine exam and preventive care every 6 months. Brush your teeth twice a day and floss once a day. Good oral hygiene prevents tooth decay and gum disease.  The frequency of eye exams is based on your age, health, family medical history, use of contact lenses, and other factors. Follow your health care provider's recommendations for frequency of eye exams.  Eat a healthy diet. Foods like vegetables, fruits, whole grains, low-fat dairy products, and lean protein foods contain the nutrients you need without too many calories. Decrease your intake of foods high in solid fats, added sugars, and salt. Eat the right amount of calories for you.Get information about a proper diet from your health care provider, if necessary.  Regular physical exercise is one of the most important things you can do for your health. Most adults should get at least 150 minutes of moderate-intensity exercise (any activity that increases your heart rate and causes you to sweat) each week. In addition, most adults need muscle-strengthening exercises on 2 or more days a week.  Maintain a healthy weight. The body mass index (BMI) is a screening tool to identify possible weight problems. It provides an estimate of body fat based on height and weight. Your health care provider can find your BMI and can help you achieve or maintain a healthy weight.For adults 20 years and older:  A BMI below 18.5 is considered underweight.  A BMI of 18.5 to 24.9 is normal.  A BMI of 25 to 29.9 is considered overweight.  A  BMI of 30 and above is considered obese.  Maintain normal blood lipids and cholesterol levels by exercising and minimizing your intake of saturated fat. Eat a balanced diet with plenty of fruit and vegetables. Blood tests for lipids and cholesterol should begin at age 45 and be repeated every 5 years. If your lipid or cholesterol levels are high, you are over 50, or you are at high risk for heart disease, you may need your cholesterol levels checked more frequently.Ongoing high lipid and cholesterol levels should be treated with medicines if diet and exercise are not working.  If you smoke, find out from your health care provider how to quit. If you do not use tobacco, do not start.  Lung cancer screening is recommended for adults aged 45-80 years who are at high risk for developing lung cancer because of a history of smoking. A yearly low-dose CT scan of the lungs is recommended for people who have at least a 30-pack-year history of smoking and are a current smoker or have quit within the past 15 years. A pack year of smoking is smoking an average of 1 pack of cigarettes a day for 1 year (for example: 1 pack a day for 30 years or 2 packs a day for 15 years). Yearly screening should continue until the smoker has stopped smoking for at least 15 years. Yearly screening should be stopped for people who develop a health problem that would prevent them from having lung cancer treatment.  If you are pregnant, do not drink alcohol. If you are  breastfeeding, be very cautious about drinking alcohol. If you are not pregnant and choose to drink alcohol, do not have more than 1 drink per day. One drink is considered to be 12 ounces (355 mL) of beer, 5 ounces (148 mL) of wine, or 1.5 ounces (44 mL) of liquor.  Avoid use of street drugs. Do not share needles with anyone. Ask for help if you need support or instructions about stopping the use of drugs.  High blood pressure causes heart disease and increases the risk  of stroke. Your blood pressure should be checked at least every 1 to 2 years. Ongoing high blood pressure should be treated with medicines if weight loss and exercise do not work.  If you are 55-79 years old, ask your health care provider if you should take aspirin to prevent strokes.  Diabetes screening is done by taking a blood sample to check your blood glucose level after you have not eaten for a certain period of time (fasting). If you are not overweight and you do not have risk factors for diabetes, you should be screened once every 3 years starting at age 45. If you are overweight or obese and you are 40-70 years of age, you should be screened for diabetes every year as part of your cardiovascular risk assessment.  Breast cancer screening is essential preventive care for women. You should practice "breast self-awareness." This means understanding the normal appearance and feel of your breasts and may include breast self-examination. Any changes detected, no matter how small, should be reported to a health care provider. Women in their 20s and 30s should have a clinical breast exam (CBE) by a health care provider as part of a regular health exam every 1 to 3 years. After age 40, women should have a CBE every year. Starting at age 40, women should consider having a mammogram (breast X-ray test) every year. Women who have a family history of breast cancer should talk to their health care provider about genetic screening. Women at a high risk of breast cancer should talk to their health care providers about having an MRI and a mammogram every year.  Breast cancer gene (BRCA)-related cancer risk assessment is recommended for women who have family members with BRCA-related cancers. BRCA-related cancers include breast, ovarian, tubal, and peritoneal cancers. Having family members with these cancers may be associated with an increased risk for harmful changes (mutations) in the breast cancer genes BRCA1 and  BRCA2. Results of the assessment will determine the need for genetic counseling and BRCA1 and BRCA2 testing.  Your health care provider may recommend that you be screened regularly for cancer of the pelvic organs (ovaries, uterus, and vagina). This screening involves a pelvic examination, including checking for microscopic changes to the surface of your cervix (Pap test). You may be encouraged to have this screening done every 3 years, beginning at age 21.  For women ages 30-65, health care providers may recommend pelvic exams and Pap testing every 3 years, or they may recommend the Pap and pelvic exam, combined with testing for human papilloma virus (HPV), every 5 years. Some types of HPV increase your risk of cervical cancer. Testing for HPV may also be done on women of any age with unclear Pap test results.  Other health care providers may not recommend any screening for nonpregnant women who are considered low risk for pelvic cancer and who do not have symptoms. Ask your health care provider if a screening pelvic exam is right for   you.  If you have had past treatment for cervical cancer or a condition that could lead to cancer, you need Pap tests and screening for cancer for at least 20 years after your treatment. If Pap tests have been discontinued, your risk factors (such as having a new sexual partner) need to be reassessed to determine if screening should resume. Some women have medical problems that increase the chance of getting cervical cancer. In these cases, your health care provider may recommend more frequent screening and Pap tests.  Colorectal cancer can be detected and often prevented. Most routine colorectal cancer screening begins at the age of 50 years and continues through age 75 years. However, your health care provider may recommend screening at an earlier age if you have risk factors for colon cancer. On a yearly basis, your health care provider may provide home test kits to check  for hidden blood in the stool. Use of a small camera at the end of a tube, to directly examine the colon (sigmoidoscopy or colonoscopy), can detect the earliest forms of colorectal cancer. Talk to your health care provider about this at age 50, when routine screening begins. Direct exam of the colon should be repeated every 5-10 years through age 75 years, unless early forms of precancerous polyps or small growths are found.  People who are at an increased risk for hepatitis B should be screened for this virus. You are considered at high risk for hepatitis B if:  You were born in a country where hepatitis B occurs often. Talk with your health care provider about which countries are considered high risk.  Your parents were born in a high-risk country and you have not received a shot to protect against hepatitis B (hepatitis B vaccine).  You have HIV or AIDS.  You use needles to inject street drugs.  You live with, or have sex with, someone who has hepatitis B.  You get hemodialysis treatment.  You take certain medicines for conditions like cancer, organ transplantation, and autoimmune conditions.  Hepatitis C blood testing is recommended for all people born from 1945 through 1965 and any individual with known risks for hepatitis C.  Practice safe sex. Use condoms and avoid high-risk sexual practices to reduce the spread of sexually transmitted infections (STIs). STIs include gonorrhea, chlamydia, syphilis, trichomonas, herpes, HPV, and human immunodeficiency virus (HIV). Herpes, HIV, and HPV are viral illnesses that have no cure. They can result in disability, cancer, and death.  You should be screened for sexually transmitted illnesses (STIs) including gonorrhea and chlamydia if:  You are sexually active and are younger than 24 years.  You are older than 24 years and your health care provider tells you that you are at risk for this type of infection.  Your sexual activity has changed  since you were last screened and you are at an increased risk for chlamydia or gonorrhea. Ask your health care provider if you are at risk.  If you are at risk of being infected with HIV, it is recommended that you take a prescription medicine daily to prevent HIV infection. This is called preexposure prophylaxis (PrEP). You are considered at risk if:  You are sexually active and do not regularly use condoms or know the HIV status of your partner(s).  You take drugs by injection.  You are sexually active with a partner who has HIV.  Talk with your health care provider about whether you are at high risk of being infected with HIV. If   you choose to begin PrEP, you should first be tested for HIV. You should then be tested every 3 months for as long as you are taking PrEP.  Osteoporosis is a disease in which the bones lose minerals and strength with aging. This can result in serious bone fractures or breaks. The risk of osteoporosis can be identified using a bone density scan. Women ages 67 years and over and women at risk for fractures or osteoporosis should discuss screening with their health care providers. Ask your health care provider whether you should take a calcium supplement or vitamin D to reduce the rate of osteoporosis.  Menopause can be associated with physical symptoms and risks. Hormone replacement therapy is available to decrease symptoms and risks. You should talk to your health care provider about whether hormone replacement therapy is right for you.  Use sunscreen. Apply sunscreen liberally and repeatedly throughout the day. You should seek shade when your shadow is shorter than you. Protect yourself by wearing long sleeves, pants, a wide-brimmed hat, and sunglasses year round, whenever you are outdoors.  Once a month, do a whole body skin exam, using a mirror to look at the skin on your back. Tell your health care provider of new moles, moles that have irregular borders, moles that  are larger than a pencil eraser, or moles that have changed in shape or color.  Stay current with required vaccines (immunizations).  Influenza vaccine. All adults should be immunized every year.  Tetanus, diphtheria, and acellular pertussis (Td, Tdap) vaccine. Pregnant women should receive 1 dose of Tdap vaccine during each pregnancy. The dose should be obtained regardless of the length of time since the last dose. Immunization is preferred during the 27th-36th week of gestation. An adult who has not previously received Tdap or who does not know her vaccine status should receive 1 dose of Tdap. This initial dose should be followed by tetanus and diphtheria toxoids (Td) booster doses every 10 years. Adults with an unknown or incomplete history of completing a 3-dose immunization series with Td-containing vaccines should begin or complete a primary immunization series including a Tdap dose. Adults should receive a Td booster every 10 years.  Varicella vaccine. An adult without evidence of immunity to varicella should receive 2 doses or a second dose if she has previously received 1 dose. Pregnant females who do not have evidence of immunity should receive the first dose after pregnancy. This first dose should be obtained before leaving the health care facility. The second dose should be obtained 4-8 weeks after the first dose.  Human papillomavirus (HPV) vaccine. Females aged 13-26 years who have not received the vaccine previously should obtain the 3-dose series. The vaccine is not recommended for use in pregnant females. However, pregnancy testing is not needed before receiving a dose. If a female is found to be pregnant after receiving a dose, no treatment is needed. In that case, the remaining doses should be delayed until after the pregnancy. Immunization is recommended for any person with an immunocompromised condition through the age of 61 years if she did not get any or all doses earlier. During the  3-dose series, the second dose should be obtained 4-8 weeks after the first dose. The third dose should be obtained 24 weeks after the first dose and 16 weeks after the second dose.  Zoster vaccine. One dose is recommended for adults aged 30 years or older unless certain conditions are present.  Measles, mumps, and rubella (MMR) vaccine. Adults born  before 1957 generally are considered immune to measles and mumps. Adults born in 1957 or later should have 1 or more doses of MMR vaccine unless there is a contraindication to the vaccine or there is laboratory evidence of immunity to each of the three diseases. A routine second dose of MMR vaccine should be obtained at least 28 days after the first dose for students attending postsecondary schools, health care workers, or international travelers. People who received inactivated measles vaccine or an unknown type of measles vaccine during 1963-1967 should receive 2 doses of MMR vaccine. People who received inactivated mumps vaccine or an unknown type of mumps vaccine before 1979 and are at high risk for mumps infection should consider immunization with 2 doses of MMR vaccine. For females of childbearing age, rubella immunity should be determined. If there is no evidence of immunity, females who are not pregnant should be vaccinated. If there is no evidence of immunity, females who are pregnant should delay immunization until after pregnancy. Unvaccinated health care workers born before 1957 who lack laboratory evidence of measles, mumps, or rubella immunity or laboratory confirmation of disease should consider measles and mumps immunization with 2 doses of MMR vaccine or rubella immunization with 1 dose of MMR vaccine.  Pneumococcal 13-valent conjugate (PCV13) vaccine. When indicated, a person who is uncertain of his immunization history and has no record of immunization should receive the PCV13 vaccine. All adults 65 years of age and older should receive this  vaccine. An adult aged 19 years or older who has certain medical conditions and has not been previously immunized should receive 1 dose of PCV13 vaccine. This PCV13 should be followed with a dose of pneumococcal polysaccharide (PPSV23) vaccine. Adults who are at high risk for pneumococcal disease should obtain the PPSV23 vaccine at least 8 weeks after the dose of PCV13 vaccine. Adults older than 56 years of age who have normal immune system function should obtain the PPSV23 vaccine dose at least 1 year after the dose of PCV13 vaccine.  Pneumococcal polysaccharide (PPSV23) vaccine. When PCV13 is also indicated, PCV13 should be obtained first. All adults aged 65 years and older should be immunized. An adult younger than age 65 years who has certain medical conditions should be immunized. Any person who resides in a nursing home or long-term care facility should be immunized. An adult smoker should be immunized. People with an immunocompromised condition and certain other conditions should receive both PCV13 and PPSV23 vaccines. People with human immunodeficiency virus (HIV) infection should be immunized as soon as possible after diagnosis. Immunization during chemotherapy or radiation therapy should be avoided. Routine use of PPSV23 vaccine is not recommended for American Indians, [place] Natives, or people younger than 65 years unless there are medical conditions that require PPSV23 vaccine. When indicated, people who have unknown immunization and have no record of immunization should receive PPSV23 vaccine. One-time revaccination 5 years after the first dose of PPSV23 is recommended for people aged 19-64 years who have chronic kidney failure, nephrotic syndrome, asplenia, or immunocompromised conditions. People who received 1-2 doses of PPSV23 before age 65 years should receive another dose of PPSV23 vaccine at age 65 years or later if at least 5 years have passed since the previous dose. Doses of PPSV23 are not  needed for people immunized with PPSV23 at or after age 65 years.  Meningococcal vaccine. Adults with asplenia or persistent complement component deficiencies should receive 2 doses of quadrivalent meningococcal conjugate (MenACWY-D) vaccine. The doses should be obtained   at least 2 months apart. Microbiologists working with certain meningococcal bacteria, [place] recruits, people at risk during an outbreak, and people who travel to or live in countries with a high rate of meningitis should be immunized. A first-year college student up through age 34 years who is living in a residence hall should receive a dose if she did not receive a dose on or after her 16th birthday. Adults who have certain high-risk conditions should receive one or more doses of vaccine.  Hepatitis A vaccine. Adults who wish to be protected from this disease, have certain high-risk conditions, work with hepatitis A-infected animals, work in hepatitis A research labs, or travel to or work in countries with a high rate of hepatitis A should be immunized. Adults who were previously unvaccinated and who anticipate close contact with an international adoptee during the first 60 days after arrival in the [place] from a country with a high rate of hepatitis A should be immunized.  Hepatitis B vaccine. Adults who wish to be protected from this disease, have certain high-risk conditions, may be exposed to blood or other infectious body fluids, are household contacts or sex partners of hepatitis B positive people, are clients or workers in certain care facilities, or travel to or work in countries with a high rate of hepatitis B should be immunized.  Haemophilus influenzae type b (Hib) vaccine. A previously unvaccinated person with asplenia or sickle cell disease or having a scheduled splenectomy should receive 1 dose of Hib vaccine. Regardless of previous immunization, a recipient of a hematopoietic stem cell transplant should receive a  3-dose series 6-12 months after her successful transplant. Hib vaccine is not recommended for adults with HIV infection. Preventive Services / Frequency Ages 35 to 4 years  Blood pressure check.** / Every 3-5 years.  Lipid and cholesterol check.** / Every 5 years beginning at age 60.  Clinical breast exam.** / Every 3 years for women in their 71s and 10s.  BRCA-related cancer risk assessment.** / For women who have family members with a BRCA-related cancer (breast, ovarian, tubal, or peritoneal cancers).  Pap test.** / Every 2 years from ages 76 through 26. Every 3 years starting at age 61 through age 76 or 93 with a history of 3 consecutive normal Pap tests.  HPV screening.** / Every 3 years from ages 37 through ages 60 to 51 with a history of 3 consecutive normal Pap tests.  Hepatitis C blood test.** / For any individual with known risks for hepatitis C.  Skin self-exam. / Monthly.  Influenza vaccine. / Every year.  Tetanus, diphtheria, and acellular pertussis (Tdap, Td) vaccine.** / Consult your health care provider. Pregnant women should receive 1 dose of Tdap vaccine during each pregnancy. 1 dose of Td every 10 years.  Varicella vaccine.** / Consult your health care provider. Pregnant females who do not have evidence of immunity should receive the first dose after pregnancy.  HPV vaccine. / 3 doses over 6 months, if 93 and younger. The vaccine is not recommended for use in pregnant females. However, pregnancy testing is not needed before receiving a dose.  Measles, mumps, rubella (MMR) vaccine.** / You need at least 1 dose of MMR if you were born in 1957 or later. You may also need a 2nd dose. For females of childbearing age, rubella immunity should be determined. If there is no evidence of immunity, females who are not pregnant should be vaccinated. If there is no evidence of immunity, females who are  pregnant should delay immunization until after pregnancy.  Pneumococcal  13-valent conjugate (PCV13) vaccine.** / Consult your health care provider.  Pneumococcal polysaccharide (PPSV23) vaccine.** / 1 to 2 doses if you smoke cigarettes or if you have certain conditions.  Meningococcal vaccine.** / 1 dose if you are age 68 to 8 years and a Market researcher living in a residence hall, or have one of several medical conditions, you need to get vaccinated against meningococcal disease. You may also need additional booster doses.  Hepatitis A vaccine.** / Consult your health care provider.  Hepatitis B vaccine.** / Consult your health care provider.  Haemophilus influenzae type b (Hib) vaccine.** / Consult your health care provider. Ages 7 to 53 years  Blood pressure check.** / Every year.  Lipid and cholesterol check.** / Every 5 years beginning at age 25 years.  Lung cancer screening. / Every year if you are aged 11-80 years and have a 30-pack-year history of smoking and currently smoke or have quit within the past 15 years. Yearly screening is stopped once you have quit smoking for at least 15 years or develop a health problem that would prevent you from having lung cancer treatment.  Clinical breast exam.** / Every year after age 48 years.  BRCA-related cancer risk assessment.** / For women who have family members with a BRCA-related cancer (breast, ovarian, tubal, or peritoneal cancers).  Mammogram.** / Every year beginning at age 41 years and continuing for as long as you are in good health. Consult with your health care provider.  Pap test.** / Every 3 years starting at age 65 years through age 37 or 70 years with a history of 3 consecutive normal Pap tests.  HPV screening.** / Every 3 years from ages 72 years through ages 60 to 40 years with a history of 3 consecutive normal Pap tests.  Fecal occult blood test (FOBT) of stool. / Every year beginning at age 21 years and continuing until age 5 years. You may not need to do this test if you get  a colonoscopy every 10 years.  Flexible sigmoidoscopy or colonoscopy.** / Every 5 years for a flexible sigmoidoscopy or every 10 years for a colonoscopy beginning at age 35 years and continuing until age 48 years.  Hepatitis C blood test.** / For all people born from [birth year] through 1965 and any individual with known risks for hepatitis C.  Skin self-exam. / Monthly.  Influenza vaccine. / Every year.  Tetanus, diphtheria, and acellular pertussis (Tdap/Td) vaccine.** / Consult your health care provider. Pregnant women should receive 1 dose of Tdap vaccine during each pregnancy. 1 dose of Td every 10 years.  Varicella vaccine.** / Consult your health care provider. Pregnant females who do not have evidence of immunity should receive the first dose after pregnancy.  Zoster vaccine.** / 1 dose for adults aged 30 years or older.  Measles, mumps, rubella (MMR) vaccine.** / You need at least 1 dose of MMR if you were born in 1957 or later. You may also need a second dose. For females of childbearing age, rubella immunity should be determined. If there is no evidence of immunity, females who are not pregnant should be vaccinated. If there is no evidence of immunity, females who are pregnant should delay immunization until after pregnancy.  Pneumococcal 13-valent conjugate (PCV13) vaccine.** / Consult your health care provider.  Pneumococcal polysaccharide (PPSV23) vaccine.** / 1 to 2 doses if you smoke cigarettes or if you have certain conditions.  Meningococcal vaccine.** /  Consult your health care provider.  Hepatitis A vaccine.** / Consult your health care provider.  Hepatitis B vaccine.** / Consult your health care provider.  Haemophilus influenzae type b (Hib) vaccine.** / Consult your health care provider. Ages 64 years and over  Blood pressure check.** / Every year.  Lipid and cholesterol check.** / Every 5 years beginning at age 23 years.  Lung cancer screening. / Every year if you  are aged 16-80 years and have a 30-pack-year history of smoking and currently smoke or have quit within the past 15 years. Yearly screening is stopped once you have quit smoking for at least 15 years or develop a health problem that would prevent you from having lung cancer treatment.  Clinical breast exam.** / Every year after age 74 years.  BRCA-related cancer risk assessment.** / For women who have family members with a BRCA-related cancer (breast, ovarian, tubal, or peritoneal cancers).  Mammogram.** / Every year beginning at age 44 years and continuing for as long as you are in good health. Consult with your health care provider.  Pap test.** / Every 3 years starting at age 58 years through age 22 or 39 years with 3 consecutive normal Pap tests. Testing can be stopped between 65 and 70 years with 3 consecutive normal Pap tests and no abnormal Pap or HPV tests in the past 10 years.  HPV screening.** / Every 3 years from ages 64 years through ages 70 or 61 years with a history of 3 consecutive normal Pap tests. Testing can be stopped between 65 and 70 years with 3 consecutive normal Pap tests and no abnormal Pap or HPV tests in the past 10 years.  Fecal occult blood test (FOBT) of stool. / Every year beginning at age 40 years and continuing until age 27 years. You may not need to do this test if you get a colonoscopy every 10 years.  Flexible sigmoidoscopy or colonoscopy.** / Every 5 years for a flexible sigmoidoscopy or every 10 years for a colonoscopy beginning at age 7 years and continuing until age 32 years.  Hepatitis C blood test.** / For all people born from [birth year] through 1966 and any individual with known risks for hepatitis C.  Osteoporosis screening.** / A one-time screening for women ages 30 years and over and women at risk for fractures or osteoporosis.  Skin self-exam. / Monthly.  Influenza vaccine. / Every year.  Tetanus, diphtheria, and acellular pertussis (Tdap/Td)  vaccine.** / 1 dose of Td every 10 years.  Varicella vaccine.** / Consult your health care provider.  Zoster vaccine.** / 1 dose for adults aged 35 years or older.  Pneumococcal 13-valent conjugate (PCV13) vaccine.** / Consult your health care provider.  Pneumococcal polysaccharide (PPSV23) vaccine.** / 1 dose for all adults aged 46 years and older.  Meningococcal vaccine.** / Consult your health care provider.  Hepatitis A vaccine.** / Consult your health care provider.  Hepatitis B vaccine.** / Consult your health care provider.  Haemophilus influenzae type b (Hib) vaccine.** / Consult your health care provider. ** Family history and personal history of risk and conditions may change your health care provider's recommendations.   This information is not intended to replace advice given to you by your health care provider. Make sure you discuss any questions you have with your health care provider.   Document Released: 01/25/2002 Document Revised: 12/20/2014 Document Reviewed: 04/26/2011 Elsevier Interactive Patient Education [employer].

## 2016-02-05 NOTE — Progress Notes (Signed)
  Subjective:     Rebecca Cuevas is a 55 y.o. female and is here for a comprehensive physical exam. The patient reports no problems. Had recent mammogram which requires f/u. Reports left breast tenderness.  Social History   Social History  . Marital Status: Single    Spouse Name: N/A  . Number of Children: N/A  . Years of Education: N/A   Occupational History  . Not on file.   Social History Main Topics  . Smoking status: Current Every Day Smoker -- 0.50 packs/day  . Smokeless tobacco: Never Used     Comment: 8 cigs a day  . Alcohol Use: 0.0 oz/week    0 Glasses of wine per week     Comment: occasional  . Drug Use: No  . Sexual Activity: Yes    Birth Control/ Protection: Surgical   Other Topics Concern  . Not on file   Social History Narrative   Health Maintenance  Topic Date Due  . PAP SMEAR  01/13/2016  . INFLUENZA VACCINE  03/13/2016 (Originally 07/14/2015)  . MAMMOGRAM  01/28/2018  . TETANUS/TDAP  04/30/2019  . COLONOSCOPY  03/02/2023  . Hepatitis C Screening  Completed  . HIV Screening  Completed    The following portions of the patient's history were reviewed and updated as appropriate: allergies, current medications, past family history, past medical history, past social history, past surgical history and problem list.  Review of Systems Pertinent items noted in HPI and remainder of comprehensive ROS otherwise negative.   Objective:    BP 132/84 mmHg  Pulse 99  Temp(Src) 98.4 F (36.9 C) (Oral)  Ht 5\' 6"  (1.676 m)  Wt 210 lb 9.6 oz (95.528 kg)  BMI 34.01 kg/m2 General appearance: alert, cooperative and appears stated age Head: Normocephalic, without obvious abnormality, atraumatic Neck: no adenopathy, supple, symmetrical, trachea midline and thyroid not enlarged, symmetric, no tenderness/mass/nodules Lungs: clear to auscultation bilaterally Heart: regular rate and rhythm, S1, S2 normal, no murmur, click, rub or gallop Abdomen: soft, non-tender;  bowel sounds normal; no masses,  no organomegaly Pelvic: cervix normal in appearance, external genitalia normal, no adnexal masses or tenderness, no cervical motion tenderness, uterus surgically absent and vagina normal without discharge Extremities: extremities normal, atraumatic, no cyanosis or edema Pulses: 2+ and symmetric Skin: Skin color, texture, turgor normal. No rashes or lesions Lymph nodes: Cervical, supraclavicular, and axillary nodes normal. Neurologic: Grossly normal    Assessment:    Healthy female exam.      Plan:     1. Screening for malignant neoplasm of cervix - Cytology - PAP  2. Encounter for routine gynecological examination Routine labs done in 10/16 and WNL Declines flu Mammogram f/u scheduled today  See After Visit Summary for Counseling Recommendations

## 2016-02-09 LAB — CYTOLOGY - PAP

## 2016-02-12 ENCOUNTER — Ambulatory Visit
Admission: RE | Admit: 2016-02-12 | Discharge: 2016-02-12 | Disposition: A | Discharge status: Home / Self Care | Payer: BLUE CROSS/BLUE SHIELD | Source: Ambulatory Visit | Attending: Family Medicine | Admitting: Family Medicine

## 2016-02-12 DIAGNOSIS — R928 Other abnormal and inconclusive findings on diagnostic imaging of breast: Secondary | ICD-10-CM

## 2016-05-18 ENCOUNTER — Other Ambulatory Visit: Payer: Self-pay | Admitting: Family Medicine

## 2016-05-19 ENCOUNTER — Other Ambulatory Visit: Payer: Self-pay | Admitting: *Deleted

## 2016-05-19 MED ORDER — HYDROCORTISONE 1 % EX CREA: TOPICAL_CREAM | CUTANEOUS | Status: AC

## 2016-07-07 ENCOUNTER — Ambulatory Visit (INDEPENDENT_AMBULATORY_CARE_PROVIDER_SITE_OTHER): Payer: BLUE CROSS/BLUE SHIELD | Admitting: Family Medicine

## 2016-07-07 ENCOUNTER — Encounter: Payer: Self-pay | Admitting: Family Medicine

## 2016-07-07 VITALS — BP 135/88 | HR 87 | Temp 98.8°F | Ht 66.0 in | Wt 210.0 lb

## 2016-07-07 DIAGNOSIS — N3001 Acute cystitis with hematuria: Secondary | ICD-10-CM | POA: Diagnosis not present

## 2016-07-07 DIAGNOSIS — H60392 Other infective otitis externa, left ear: Secondary | ICD-10-CM

## 2016-07-07 DIAGNOSIS — N898 Other specified noninflammatory disorders of vagina: Secondary | ICD-10-CM | POA: Diagnosis not present

## 2016-07-07 DIAGNOSIS — I10 Essential (primary) hypertension: Secondary | ICD-10-CM | POA: Diagnosis not present

## 2016-07-07 LAB — POCT URINALYSIS DIPSTICK
Bilirubin, UA: NEGATIVE
Glucose, UA: NEGATIVE
Leukocytes, UA: NEGATIVE
Nitrite, UA: NEGATIVE
PH UA: 6.5
PROTEIN UA: 30
SPEC GRAV UA: 1.025
UROBILINOGEN UA: 0.2

## 2016-07-07 LAB — POCT WET PREP (WET MOUNT): CLUE CELLS WET PREP WHIFF POC: NEGATIVE

## 2016-07-07 LAB — POCT UA - MICROSCOPIC ONLY

## 2016-07-07 MED ORDER — SULFAMETHOXAZOLE-TRIMETHOPRIM 800-160 MG PO TABS: 1.0000 | ORAL_TABLET | Freq: Two times a day (BID) | ORAL | 0 refills | Status: DC

## 2016-07-07 MED ORDER — NEOMYCIN-POLYMYXIN-HC 3.5-10000-1 OT SUSP: 3.0000 [drp] | Freq: Four times a day (QID) | OTIC | Status: DC

## 2016-07-07 MED ORDER — HYDROCHLOROTHIAZIDE 25 MG PO TABS: 25.0000 mg | ORAL_TABLET | Freq: Every day | ORAL | 3 refills | Status: DC

## 2016-07-07 MED ORDER — FLUCONAZOLE 150 MG PO TABS: 150.0000 mg | ORAL_TABLET | Freq: Every day | ORAL | 2 refills | Status: DC

## 2016-07-07 NOTE — Patient Instructions (Addendum)
Otitis Externa Otitis externa is a bacterial or fungal infection of the outer ear canal. This is the area from the eardrum to the outside of the ear. Otitis externa is sometimes called "swimmer's ear." CAUSES  Possible causes of infection include:  Swimming in dirty water.  Moisture remaining in the ear after swimming or bathing.  Mild injury (trauma) to the ear.  Objects stuck in the ear (foreign body).  Cuts or scrapes (abrasions) on the outside of the ear. SIGNS AND SYMPTOMS  The first symptom of infection is often itching in the ear canal. Later signs and symptoms may include swelling and redness of the ear canal, ear pain, and yellowish-white fluid (pus) coming from the ear. The ear pain may be worse when pulling on the earlobe. DIAGNOSIS  Your health care provider will perform a physical exam. A sample of fluid may be taken from the ear and examined for bacteria or fungi. TREATMENT  Antibiotic ear drops are often given for 10 to 14 days. Treatment may also include pain medicine or corticosteroids to reduce itching and swelling. HOME CARE INSTRUCTIONS   Apply antibiotic ear drops to the ear canal as prescribed by your health care provider.  Take medicines only as directed by your health care provider.  If you have diabetes, follow any additional treatment instructions from your health care provider.  Keep all follow-up visits as directed by your health care provider. PREVENTION   Keep your ear dry. Use the corner of a towel to absorb water out of the ear canal after swimming or bathing.  Avoid scratching or putting objects inside your ear. This can damage the ear canal or remove the protective wax that lines the canal. This makes it easier for bacteria and fungi to grow.  Avoid swimming in lakes, polluted water, or poorly chlorinated pools.  You may use ear drops made of rubbing alcohol and vinegar after swimming. Combine equal parts of white vinegar and alcohol in a bottle.  Put 3 or 4 drops into each ear after swimming. SEEK MEDICAL CARE IF:   You have a fever.  Your ear is still red, swollen, painful, or draining pus after 3 days.  Your redness, swelling, or pain gets worse.  You have a severe headache.  You have redness, swelling, pain, or tenderness in the area behind your ear. MAKE SURE YOU:   Understand these instructions.  Will watch your condition.  Will get help right away if you are not doing well or get worse.   This information is not intended to replace advice given to you by your health care provider. Make sure you discuss any questions you have with your health care provider.   Document Released: 11/29/2005 Document Revised: 12/20/2014 Document Reviewed: 12/16/2011 Elsevier Interactive Patient Education 2016 [employer]  Urinary Tract Infection Urinary tract infections (UTIs) can develop anywhere along your urinary tract. Your urinary tract is your body's drainage system for removing wastes and extra water. Your urinary tract includes two kidneys, two ureters, a bladder, and a urethra. Your kidneys are a pair of bean-shaped organs. Each kidney is about the size of your fist. They are located below your ribs, one on each side of your spine. CAUSES Infections are caused by microbes, which are microscopic organisms, including fungi, viruses, and bacteria. These organisms are so small that they can only be seen through a microscope. Bacteria are the microbes that most commonly cause UTIs. SYMPTOMS  Symptoms of UTIs may vary by age and gender  of the patient and by the location of the infection. Symptoms in young women typically include a frequent and intense urge to urinate and a painful, burning feeling in the bladder or urethra during urination. Older women and men are more likely to be tired, shaky, and weak and have muscle aches and abdominal pain. A fever may mean the infection is in your kidneys. Other symptoms of a kidney infection  include pain in your back or sides below the ribs, nausea, and vomiting. DIAGNOSIS To diagnose a UTI, your caregiver will ask you about your symptoms. Your caregiver will also ask you to provide a urine sample. The urine sample will be tested for bacteria and white blood cells. White blood cells are made by your body to help fight infection. TREATMENT  Typically, UTIs can be treated with medication. Because most UTIs are caused by a bacterial infection, they usually can be treated with the use of antibiotics. The choice of antibiotic and length of treatment depend on your symptoms and the type of bacteria causing your infection. HOME CARE INSTRUCTIONS  If you were prescribed antibiotics, take them exactly as your caregiver instructs you. Finish the medication even if you feel better after you have only taken some of the medication.  Drink enough water and fluids to keep your urine clear or pale yellow.  Avoid caffeine, tea, and carbonated beverages. They tend to irritate your bladder.  Empty your bladder often. Avoid holding urine for long periods of time.  Empty your bladder before and after sexual intercourse.  After a bowel movement, women should cleanse from front to back. Use each tissue only once. SEEK MEDICAL CARE IF:   You have back pain.  You develop a fever.  Your symptoms do not begin to resolve within 3 days. SEEK IMMEDIATE MEDICAL CARE IF:   You have severe back pain or lower abdominal pain.  You develop chills.  You have nausea or vomiting.  You have continued burning or discomfort with urination. MAKE SURE YOU:   Understand these instructions.  Will watch your condition.  Will get help right away if you are not doing well or get worse.   This information is not intended to replace advice given to you by your health care provider. Make sure you discuss any questions you have with your health care provider.   Document Released: 09/08/2005 Document Revised:  08/20/2015 Document Reviewed: 01/07/2012 Elsevier Interactive Patient Education [employer].

## 2016-07-07 NOTE — Assessment & Plan Note (Signed)
Refilled her HCTZ

## 2016-07-07 NOTE — Progress Notes (Signed)
   Subjective:    Patient ID: Rebecca Cuevas is a 55 y.o. female presenting with Dysuria  on 07/07/2016  HPI: Not sexually active. Having strong odor to urine with dysuria. Reports nocturia. Sometimes leaking when bladder is full. Vaginal itching for same amount of time. Reports pain in left midthoracic region. Left ear itching since daughter did her hair. Has long h/o eczema. No swimming.  Review of Systems  Constitutional: Negative for chills and fever.  HENT: Positive for ear pain.   Respiratory: Negative for shortness of breath.   Cardiovascular: Negative for chest pain.  Gastrointestinal: Negative for abdominal pain, nausea and vomiting.  Genitourinary: Positive for dysuria and vaginal discharge.  Skin: Negative for rash.      Objective:    BP 135/88   Pulse 87   Temp 98.8 F (37.1 C) (Oral)   Ht 5\' 6"  (1.676 m)   Wt 210 lb (95.3 kg)   BMI 33.89 kg/m  Physical Exam  Constitutional: She is oriented to person, place, and time. She appears well-developed and well-nourished. No distress.  HENT:  Head: Normocephalic and atraumatic.  Left Ear: There is tenderness (with tragus manipulation).  Eyes: No scleral icterus.  Neck: Neck supple.  Cardiovascular: Normal rate.   Pulmonary/Chest: Effort normal.  Abdominal: Soft.  Neurological: She is alert and oriented to person, place, and time.  Skin: Skin is warm and dry.  Psychiatric: She has a normal mood and affect.        Assessment & Plan:   Problem List Items Addressed This Visit      Unprioritized   Essential hypertension    Refilled her HCTZ      Relevant Medications   hydrochlorothiazide (HYDRODIURIL) 25 MG tablet    Other Visit Diagnoses    Vaginal irritation    -  Primary   Yeast on U/A-and given abx for UTI--will give Diflucan-1 today and following Abx   Relevant Medications   fluconazole (DIFLUCAN) 150 MG tablet   Other Relevant Orders   POCT Wet Prep [facility]) (Completed)   Urinalysis Dipstick  (Completed)   POCT UA - Microscopic Only (Completed)   Otitis, externa, infective, left       likley mild-will treat.   Relevant Medications   neomycin-polymyxin-hydrocortisone (CORTISPORIN) otic suspension 3 drop (Start on 07/07/2016  6:00 PM)   sulfamethoxazole-trimethoprim (BACTRIM DS,SEPTRA DS) 800-160 MG tablet   fluconazole (DIFLUCAN) 150 MG tablet   Acute cystitis with hematuria       check culture--3d couse of Bactrim ds bid   Relevant Medications   sulfamethoxazole-trimethoprim (BACTRIM DS,SEPTRA DS) 800-160 MG tablet   Other Relevant Orders   Urine culture      Total face-to-face time with patient: 25 minutes. Over 50% of encounter was spent on counseling and coordination of care. Return in about 6 months (around 01/07/2017) for a follow-up.  Rebecca Cuevas 07/07/2016 3:47 PM

## 2016-07-09 LAB — URINE CULTURE

## 2016-09-07 ENCOUNTER — Ambulatory Visit: Payer: BLUE CROSS/BLUE SHIELD | Admitting: Family Medicine

## 2016-10-01 LAB — HM DIABETES EYE EXAM

## 2016-10-04 ENCOUNTER — Ambulatory Visit: Payer: BLUE CROSS/BLUE SHIELD | Admitting: Family Medicine

## 2016-11-22 ENCOUNTER — Ambulatory Visit (INDEPENDENT_AMBULATORY_CARE_PROVIDER_SITE_OTHER): Payer: BLUE CROSS/BLUE SHIELD | Admitting: Family Medicine

## 2016-11-22 ENCOUNTER — Encounter: Payer: Self-pay | Admitting: Family Medicine

## 2016-11-22 DIAGNOSIS — I10 Essential (primary) hypertension: Secondary | ICD-10-CM

## 2016-11-22 DIAGNOSIS — E119 Type 2 diabetes mellitus without complications: Secondary | ICD-10-CM | POA: Insufficient documentation

## 2016-11-22 DIAGNOSIS — R7303 Prediabetes: Secondary | ICD-10-CM

## 2016-11-22 DIAGNOSIS — R1011 Right upper quadrant pain: Secondary | ICD-10-CM

## 2016-11-22 DIAGNOSIS — M546 Pain in thoracic spine: Secondary | ICD-10-CM | POA: Insufficient documentation

## 2016-11-22 NOTE — Assessment & Plan Note (Signed)
Takes HCTZ as needed only--BP is good today

## 2016-11-22 NOTE — Progress Notes (Signed)
   Subjective:    Patient ID: Rebecca Cuevas is a 55 y.o. female presenting with Back Pain  on 11/22/2016  HPI: Reports right sided back pain and it shoots to the RUQ pain. It is worse at night, every night. Has some soreness and sometimes it takes her breath away and is worse with movement. She notes it is worse when she lays on the side. Reports that she has not nausea. Mylanta has been helping. Has h/o ulcers. Has h/o MRI of thoracic spine in 03/2015 without significant findings to explain this pain.  Review of Systems  Constitutional: Negative for chills and fever.  Respiratory: Negative for shortness of breath.   Cardiovascular: Negative for chest pain.  Gastrointestinal: Positive for abdominal pain. Negative for nausea and vomiting.  Genitourinary: Negative for dysuria.  Skin: Negative for rash.      Objective:    BP 118/68   Pulse 91   Temp 98.5 F (36.9 C) (Oral)   Ht 5\' 6"  (1.676 m)   Wt 203 lb (92.1 kg)   SpO2 99%   BMI 32.77 kg/m  Physical Exam  Constitutional: She is oriented to person, place, and time. She appears well-developed and well-nourished. No distress.  HENT:  Head: Normocephalic and atraumatic.  Eyes: No scleral icterus.  Neck: Neck supple.  Cardiovascular: Normal rate.   Pulmonary/Chest: Effort normal.  Abdominal: Soft. There is tenderness.  Moderate tenderness to palpation.  Neurological: She is alert and oriented to person, place, and time.  Skin: Skin is warm and dry.  Psychiatric: She has a normal mood and affect.        Assessment & Plan:   Problem List Items Addressed This Visit      Unprioritized   Essential hypertension    Takes HCTZ as needed only--BP is good today      Borderline diabetes    Work on diet and exercise      RUQ pain   Relevant Orders   US Abdomen Limited RUQ      Total face-to-face time with patient: 15 minutes. Over 50% of encounter was spent on counseling and coordination of care.  Return in about  6 months (around 05/23/2017).   Donnamae Jude 11/22/2016 3:37 PM

## 2016-11-22 NOTE — Patient Instructions (Addendum)
Back Pain, Adult Introduction Back pain is very common. The pain often gets better over time. The cause of back pain is usually not dangerous. Most people can learn to manage their back pain on their own. Follow these instructions at home: Watch your back pain for any changes. The following actions may help to lessen any pain you are feeling:  Stay active. Start with short walks on flat ground if you can. Try to walk farther each day.  Exercise regularly as told by your doctor. Exercise helps your back heal faster. It also helps avoid future injury by keeping your muscles strong and flexible.  Do not sit, drive, or stand in one place for more than 30 minutes.  Do not stay in bed. Resting more than 1-2 days can slow down your recovery.  Be careful when you bend or lift an object. Use good form when lifting:  Bend at your knees.  Keep the object close to your body.  Do not twist.  Sleep on a firm mattress. Lie on your side, and bend your knees. If you lie on your back, put a pillow under your knees.  Take medicines only as told by your doctor.  Put ice on the injured area.  Put ice in a plastic bag.  Place a towel between your skin and the bag.  Leave the ice on for 20 minutes, 2-3 times a day for the first 2-3 days. After that, you can switch between ice and heat packs.  Avoid feeling anxious or stressed. Find good ways to deal with stress, such as exercise.  Maintain a healthy weight. Extra weight puts stress on your back. Contact a doctor if:  You have pain that does not go away with rest or medicine.  You have worsening pain that goes down into your legs or buttocks.  You have pain that does not get better in one week.  You have pain at night.  You lose weight.  You have a fever or chills. Get help right away if:  You cannot control when you poop (bowel movement) or pee (urinate).  Your arms or legs feel weak.  Your arms or legs lose feeling  (numbness).  You feel sick to your stomach (nauseous) or throw up (vomit).  You have belly (abdominal) pain.  You feel like you may pass out (faint). This information is not intended to replace advice given to you by your health care provider. Make sure you discuss any questions you have with your health care provider. Document Released: 05/17/2008 Document Revised: 05/06/2016 Document Reviewed: 04/02/2014  2017 Elsevier  Carbohydrate Counting for Diabetes Mellitus, Adult Carbohydrate counting is a method for keeping track of how many carbohydrates you eat. Eating carbohydrates naturally increases the amount of sugar (glucose) in the blood. Counting how many carbohydrates you eat helps keep your blood glucose within normal limits, which helps you manage your diabetes (diabetes mellitus). It is important to know how many carbohydrates you can safely have in each meal. This is different for every person. A diet and nutrition specialist (registered dietitian) can help you make a meal plan and calculate how many carbohydrates you should have at each meal and snack. Carbohydrates are found in the following foods:  Grains, such as breads and cereals.  Dried beans and soy products.  Starchy vegetables, such as potatoes, peas, and corn.  Fruit and fruit juices.  Milk and yogurt.  Sweets and snack foods, such as cake, cookies, candy, chips, and soft drinks. How  do I count carbohydrates? There are two ways to count carbohydrates in food. You can use either of the methods or a combination of both. Reading "Nutrition Facts" on packaged food  The "Nutrition Facts" list is included on the labels of almost all packaged foods and beverages in the [place] It includes:  The serving size.  Information about nutrients in each serving, including the grams (g) of carbohydrate per serving. To use the "Nutrition Facts":  Decide how many servings you will have.  Multiply the number of servings by the  number of carbohydrates per serving.  The resulting number is the total amount of carbohydrates that you will be having. Learning standard serving sizes of other foods  When you eat foods containing carbohydrates that are not packaged or do not include "Nutrition Facts" on the label, you need to measure the servings in order to count the amount of carbohydrates:  Measure the foods that you will eat with a food scale or measuring cup, if needed.  Decide how many standard-size servings you will eat.  Multiply the number of servings by 15. Most carbohydrate-rich foods have about 15 g of carbohydrates per serving.  For example, if you eat 8 oz (170 g) of strawberries, you will have eaten 2 servings and 30 g of carbohydrates (2 servings x 15 g = 30 g).  For foods that have more than one food mixed, such as soups and casseroles, you must count the carbohydrates in each food that is included. The following list contains standard serving sizes of common carbohydrate-rich foods. Each of these servings has about 15 g of carbohydrates:   hamburger bun or  English muffin.   oz (15 mL) syrup.   oz (14 g) jelly.  1 slice of bread.  1 six-inch tortilla.  3 oz (85 g) cooked rice or pasta.  4 oz (113 g) cooked dried beans.  4 oz (113 g) starchy vegetable, such as peas, corn, or potatoes.  4 oz (113 g) hot cereal.  4 oz (113 g) mashed potatoes or  of a large baked potato.  4 oz (113 g) canned or frozen fruit.  4 oz (120 mL) fruit juice.  4-6 crackers.  6 chicken nuggets.  6 oz (170 g) unsweetened dry cereal.  6 oz (170 g) plain fat-free yogurt or yogurt sweetened with artificial sweeteners.  8 oz (240 mL) milk.  8 oz (170 g) fresh fruit or one small piece of fruit.  24 oz (680 g) popped popcorn. Example of carbohydrate counting Sample meal  3 oz (85 g) chicken breast.  6 oz (170 g) brown rice.  4 oz (113 g) corn.  8 oz (240 mL) milk.  8 oz (170 g) strawberries with  sugar-free whipped topping. Carbohydrate calculation 1. Identify the foods that contain carbohydrates:  Rice.  Corn.  Milk.  Strawberries. 2. Calculate how many servings you have of each food:  2 servings rice.  1 serving corn.  1 serving milk.  1 serving strawberries. 3. Multiply each number of servings by 15 g:  2 servings rice x 15 g = 30 g.  1 serving corn x 15 g = 15 g.  1 serving milk x 15 g = 15 g.  1 serving strawberries x 15 g = 15 g. 4. Add together all of the amounts to find the total grams of carbohydrates eaten:  30 g + 15 g + 15 g + 15 g = 75 g of carbohydrates total. This information is  not intended to replace advice given to you by your health care provider. Make sure you discuss any questions you have with your health care provider. Document Released: 11/29/2005 Document Revised: 06/18/2016 Document Reviewed: 05/12/2016 Elsevier Interactive Patient Education  2017 [employer] DASH stands for "Dietary Approaches to Stop Hypertension." The DASH eating plan is a healthy eating plan that has been shown to reduce high blood pressure (hypertension). Additional health benefits may include reducing the risk of type 2 diabetes mellitus, heart disease, and stroke. The DASH eating plan may also help with weight loss. What do I need to know about the DASH eating plan? For the DASH eating plan, you will follow these general guidelines:  Choose foods with less than 150 milligrams of sodium per serving (as listed on the food label).  Use salt-free seasonings or herbs instead of table salt or sea salt.  Check with your health care provider or pharmacist before using salt substitutes.  Eat lower-sodium products. These are often labeled as "low-sodium" or "no salt added."  Eat fresh foods. Avoid eating a lot of canned foods.  Eat more vegetables, fruits, and low-fat dairy products.  Choose whole grains. Look for the word "whole" as the first word  in the ingredient list.  Choose fish and skinless chicken or [place] more often than red meat. Limit fish, poultry, and meat to 6 oz (170 g) each day.  Limit sweets, desserts, sugars, and sugary drinks.  Choose heart-healthy fats.  Eat more home-cooked food and less restaurant, buffet, and fast food.  Limit fried foods.  Do not fry foods. Cook foods using methods such as baking, boiling, grilling, and broiling instead.  When eating at a restaurant, ask that your food be prepared with less salt, or no salt if possible. What foods can I eat? Seek help from a dietitian for individual calorie needs. Grains  Whole grain or whole wheat bread. Brown rice. Whole grain or whole wheat pasta. Quinoa, bulgur, and whole grain cereals. Low-sodium cereals. Corn or whole wheat flour tortillas. Whole grain cornbread. Whole grain crackers. Low-sodium crackers. Vegetables  Fresh or frozen vegetables (raw, steamed, roasted, or grilled). Low-sodium or reduced-sodium tomato and vegetable juices. Low-sodium or reduced-sodium tomato sauce and paste. Low-sodium or reduced-sodium canned vegetables. Fruits  All fresh, canned (in natural juice), or frozen fruits. Meat and Other Protein Products  Ground beef (85% or leaner), grass-fed beef, or beef trimmed of fat. Skinless chicken or [place]. Ground chicken or [place]. Pork trimmed of fat. All fish and seafood. Eggs. Dried beans, peas, or lentils. Unsalted nuts and seeds. Unsalted canned beans. Dairy  Low-fat dairy products, such as skim or 1% milk, 2% or reduced-fat cheeses, low-fat ricotta or cottage cheese, or plain low-fat yogurt. Low-sodium or reduced-sodium cheeses. Fats and Oils  Tub margarines without trans fats. Light or reduced-fat mayonnaise and salad dressings (reduced sodium). Avocado. Safflower, olive, or canola oils. Natural peanut or almond butter. Other  Unsalted popcorn and pretzels. The items listed above may not be a complete list of recommended  foods or beverages. Contact your dietitian for more options.  What foods are not recommended? Grains  White bread. White pasta. White rice. Refined cornbread. Bagels and croissants. Crackers that contain trans fat. Vegetables  Creamed or fried vegetables. Vegetables in a cheese sauce. Regular canned vegetables. Regular canned tomato sauce and paste. Regular tomato and vegetable juices. Fruits  Canned fruit in light or heavy syrup. Fruit juice. Meat and Other Protein Products  Fatty  cuts of meat. Ribs, chicken wings, bacon, sausage, bologna, salami, chitterlings, fatback, hot dogs, bratwurst, and packaged luncheon meats. Salted nuts and seeds. Canned beans with salt. Dairy  Whole or 2% milk, cream, half-and-half, and cream cheese. Whole-fat or sweetened yogurt. Full-fat cheeses or blue cheese. Nondairy creamers and whipped toppings. Processed cheese, cheese spreads, or cheese curds. Condiments  Onion and garlic salt, seasoned salt, table salt, and sea salt. Canned and packaged gravies. Worcestershire sauce. Tartar sauce. Barbecue sauce. Teriyaki sauce. Soy sauce, including reduced sodium. Steak sauce. Fish sauce. Oyster sauce. Cocktail sauce. Horseradish. Ketchup and mustard. Meat flavorings and tenderizers. Bouillon cubes. Hot sauce. Tabasco sauce. Marinades. Taco seasonings. Relishes. Fats and Oils  Butter, stick margarine, lard, shortening, ghee, and bacon fat. Coconut, palm kernel, or palm oils. Regular salad dressings. Other  Pickles and olives. Salted popcorn and pretzels. The items listed above may not be a complete list of foods and beverages to avoid. Contact your dietitian for more information.  Where can I find more information? National Heart, Lung, and Blood Institute: travelstabloid.com This information is not intended to replace advice given to you by your health care provider. Make sure you discuss any questions you have with your health care  provider. Document Released: 11/18/2011 Document Revised: 05/06/2016 Document Reviewed: 10/03/2013 Elsevier Interactive Patient Education  2017 [employer].

## 2016-11-22 NOTE — Assessment & Plan Note (Signed)
Unclear etiology, not detected by previous MRI--will check RUQ U/S and see if this is an atypical presentation of cholelithiasis.

## 2016-11-22 NOTE — Assessment & Plan Note (Signed)
Work on diet and exercise 

## 2016-12-02 ENCOUNTER — Ambulatory Visit
Admission: RE | Admit: 2016-12-02 | Discharge: 2016-12-02 | Disposition: A | Discharge status: Home / Self Care | Payer: BLUE CROSS/BLUE SHIELD | Source: Ambulatory Visit | Attending: Family Medicine | Admitting: Family Medicine

## 2016-12-02 DIAGNOSIS — R1011 Right upper quadrant pain: Secondary | ICD-10-CM

## 2016-12-09 ENCOUNTER — Telehealth: Payer: Self-pay | Admitting: Family Medicine

## 2016-12-09 ENCOUNTER — Telehealth: Payer: Self-pay

## 2016-12-09 NOTE — Telephone Encounter (Signed)
-----   Message from Donnamae Jude, MD sent at 12/09/2016 11:38 AM EST ----- Her ruq ultrasound is negative for gall stones--please inform patient of this.

## 2016-12-09 NOTE — Telephone Encounter (Signed)
Pt is returning our call and was told about her [place] being negative for gallstones. She would also like Dr. Kennon Rounds to call her tomorrow when she is her around 2 pm since she will be off and at home tomorrow. jw

## 2016-12-09 NOTE — Telephone Encounter (Signed)
Spoke with patient and since her ultrasound was negative she would like to know if there is anything else she needs to be doing.  Patient informed me that pain is improving but still present.  Seville Downs,CMA

## 2016-12-09 NOTE — Telephone Encounter (Signed)
Left message for pt to return my phonecall.

## 2016-12-17 NOTE — Telephone Encounter (Signed)
Spoke with patient and informed her of message from provider.  Patient voiced understanding and states that she does still have the pain occasionally and usually at night when she is moving.  Patient believes that it may be coming from her back but will follow up with provider.  Angele Wiemann,CMA

## 2017-01-27 ENCOUNTER — Ambulatory Visit (INDEPENDENT_AMBULATORY_CARE_PROVIDER_SITE_OTHER): Payer: BLUE CROSS/BLUE SHIELD | Admitting: Family Medicine

## 2017-01-27 ENCOUNTER — Encounter: Payer: Self-pay | Admitting: Family Medicine

## 2017-01-27 VITALS — BP 120/60 | HR 72 | Temp 98.4°F | Ht 66.0 in | Wt 203.0 lb

## 2017-01-27 DIAGNOSIS — R7303 Prediabetes: Secondary | ICD-10-CM

## 2017-01-27 DIAGNOSIS — M1990 Unspecified osteoarthritis, unspecified site: Secondary | ICD-10-CM

## 2017-01-27 DIAGNOSIS — Z0000 Encounter for general adult medical examination without abnormal findings: Secondary | ICD-10-CM | POA: Diagnosis not present

## 2017-01-27 DIAGNOSIS — I10 Essential (primary) hypertension: Secondary | ICD-10-CM | POA: Diagnosis not present

## 2017-01-27 DIAGNOSIS — R739 Hyperglycemia, unspecified: Secondary | ICD-10-CM

## 2017-01-27 LAB — COMPREHENSIVE METABOLIC PANEL
ALBUMIN: 4.1 g/dL (ref 3.6–5.1)
ALT: 11 U/L (ref 6–29)
AST: 14 U/L (ref 10–35)
Alkaline Phosphatase: 79 U/L (ref 33–130)
BILIRUBIN TOTAL: 0.2 mg/dL (ref 0.2–1.2)
BUN: 21 mg/dL (ref 7–25)
CO2: 24 mmol/L (ref 20–31)
CREATININE: 1.1 mg/dL — AB (ref 0.50–1.05)
Calcium: 9 mg/dL (ref 8.6–10.4)
Chloride: 106 mmol/L (ref 98–110)
GLUCOSE: 88 mg/dL (ref 65–99)
Potassium: 4.1 mmol/L (ref 3.5–5.3)
SODIUM: 140 mmol/L (ref 135–146)
Total Protein: 7.6 g/dL (ref 6.1–8.1)

## 2017-01-27 LAB — LIPID PANEL
Cholesterol: 199 mg/dL (ref <200)
HDL: 44 mg/dL — ABNORMAL LOW (ref >50)
Total CHOL/HDL Ratio: 4.5 Ratio (ref <5.0)
Triglycerides: 458 mg/dL — ABNORMAL HIGH (ref <150)

## 2017-01-27 LAB — CBC
HCT: 38.9 % (ref 35.0–45.0)
Hemoglobin: 12.5 g/dL (ref 11.7–15.5)
MCH: 26.9 pg — AB (ref 27.0–33.0)
MCHC: 32.1 g/dL (ref 32.0–36.0)
MCV: 83.7 fL (ref 80.0–100.0)
MPV: 10.7 fL (ref 7.5–12.5)
Platelets: 234 10*3/uL (ref 140–400)
RBC: 4.65 MIL/uL (ref 3.80–5.10)
RDW: 15.4 % — AB (ref 11.0–15.0)
WBC: 10.2 10*3/uL (ref 3.8–10.8)

## 2017-01-27 LAB — POCT GLYCOSYLATED HEMOGLOBIN (HGB A1C): HEMOGLOBIN A1C: 6.8

## 2017-01-27 LAB — TSH: TSH: 1.36 mIU/L

## 2017-01-27 MED ORDER — MELOXICAM 7.5 MG PO TABS: 7.5000 mg | ORAL_TABLET | Freq: Every day | ORAL | 2 refills | Status: DC

## 2017-01-27 MED ORDER — CYCLOBENZAPRINE HCL 10 MG PO TABS: 10.0000 mg | ORAL_TABLET | Freq: Three times a day (TID) | ORAL | 0 refills | Status: DC | PRN

## 2017-01-27 MED ORDER — HYDROCHLOROTHIAZIDE 25 MG PO TABS: 25.0000 mg | ORAL_TABLET | Freq: Every day | ORAL | 3 refills | Status: DC

## 2017-01-27 NOTE — Assessment & Plan Note (Signed)
With elevated BS--needs repeat testing.

## 2017-01-27 NOTE — Assessment & Plan Note (Signed)
HTN is well controlled. Continue 1/2 tab of HCTZ.

## 2017-01-27 NOTE — Patient Instructions (Addendum)
Knee Pain Introduction Knee pain is a common problem. It can have many causes. The pain often goes away by following your doctor's home care instructions. Treatment for ongoing pain will depend on the cause of your pain. If your knee pain continues, more tests may be needed to diagnose your condition. Tests may include X-rays or other imaging studies of your knee. Follow these instructions at home:  Take medicines only as told by your doctor.  Rest your knee and keep it raised (elevated) while you are resting.  Do not do things that cause pain or make your pain worse.  Avoid activities where both feet leave the ground at the same time, such as running, jumping rope, or doing jumping jacks.  Apply ice to the knee area:  Put ice in a plastic bag.  Place a towel between your skin and the bag.  Leave the ice on for 20 minutes, 2-3 times a day.  Ask your doctor if you should wear an elastic knee support.  Sleep with a pillow under your knee.  Lose weight if you are overweight. Being overweight can make your knee hurt more.  Do not use any tobacco products, including cigarettes, chewing tobacco, or electronic cigarettes. If you need help quitting, ask your doctor. Smoking may slow the healing of any bone and joint problems that you may have. Contact a doctor if:  Your knee pain does not stop, it changes, or it gets worse.  You have a fever along with knee pain.  Your knee gives out or locks up.  Your knee becomes more swollen. Get help right away if:  Your knee feels hot to the touch.  You have chest pain or trouble breathing. This information is not intended to replace advice given to you by your health care provider. Make sure you discuss any questions you have with your health care provider. Document Released: 02/25/2009 Document Revised: 05/06/2016 Document Reviewed: 01/30/2014  2017 Elsevier  Carbohydrate Counting for Diabetes Mellitus, Adult Carbohydrate counting is a  method for keeping track of how many carbohydrates you eat. Eating carbohydrates naturally increases the amount of sugar (glucose) in the blood. Counting how many carbohydrates you eat helps keep your blood glucose within normal limits, which helps you manage your diabetes (diabetes mellitus). It is important to know how many carbohydrates you can safely have in each meal. This is different for every person. A diet and nutrition specialist (registered dietitian) can help you make a meal plan and calculate how many carbohydrates you should have at each meal and snack. Carbohydrates are found in the following foods:  Grains, such as breads and cereals.  Dried beans and soy products.  Starchy vegetables, such as potatoes, peas, and corn.  Fruit and fruit juices.  Milk and yogurt.  Sweets and snack foods, such as cake, cookies, candy, chips, and soft drinks. How do I count carbohydrates? There are two ways to count carbohydrates in food. You can use either of the methods or a combination of both. Reading "Nutrition Facts" on packaged food  The "Nutrition Facts" list is included on the labels of almost all packaged foods and beverages in the [place] It includes:  The serving size.  Information about nutrients in each serving, including the grams (g) of carbohydrate per serving. To use the "Nutrition Facts":  Decide how many servings you will have.  Multiply the number of servings by the number of carbohydrates per serving.  The resulting number is the total amount of carbohydrates  that you will be having. Learning standard serving sizes of other foods  When you eat foods containing carbohydrates that are not packaged or do not include "Nutrition Facts" on the label, you need to measure the servings in order to count the amount of carbohydrates:  Measure the foods that you will eat with a food scale or measuring cup, if needed.  Decide how many standard-size servings you will  eat.  Multiply the number of servings by 15. Most carbohydrate-rich foods have about 15 g of carbohydrates per serving.  For example, if you eat 8 oz (170 g) of strawberries, you will have eaten 2 servings and 30 g of carbohydrates (2 servings x 15 g = 30 g).  For foods that have more than one food mixed, such as soups and casseroles, you must count the carbohydrates in each food that is included. The following list contains standard serving sizes of common carbohydrate-rich foods. Each of these servings has about 15 g of carbohydrates:   hamburger bun or  English muffin.   oz (15 mL) syrup.   oz (14 g) jelly.  1 slice of bread.  1 six-inch tortilla.  3 oz (85 g) cooked rice or pasta.  4 oz (113 g) cooked dried beans.  4 oz (113 g) starchy vegetable, such as peas, corn, or potatoes.  4 oz (113 g) hot cereal.  4 oz (113 g) mashed potatoes or  of a large baked potato.  4 oz (113 g) canned or frozen fruit.  4 oz (120 mL) fruit juice.  4-6 crackers.  6 chicken nuggets.  6 oz (170 g) unsweetened dry cereal.  6 oz (170 g) plain fat-free yogurt or yogurt sweetened with artificial sweeteners.  8 oz (240 mL) milk.  8 oz (170 g) fresh fruit or one small piece of fruit.  24 oz (680 g) popped popcorn. Example of carbohydrate counting Sample meal  3 oz (85 g) chicken breast.  6 oz (170 g) brown rice.  4 oz (113 g) corn.  8 oz (240 mL) milk.  8 oz (170 g) strawberries with sugar-free whipped topping. Carbohydrate calculation 1. Identify the foods that contain carbohydrates:  Rice.  Corn.  Milk.  Strawberries. 2. Calculate how many servings you have of each food:  2 servings rice.  1 serving corn.  1 serving milk.  1 serving strawberries. 3. Multiply each number of servings by 15 g:  2 servings rice x 15 g = 30 g.  1 serving corn x 15 g = 15 g.  1 serving milk x 15 g = 15 g.  1 serving strawberries x 15 g = 15 g. 4. Add together all of the  amounts to find the total grams of carbohydrates eaten:  30 g + 15 g + 15 g + 15 g = 75 g of carbohydrates total. This information is not intended to replace advice given to you by your health care provider. Make sure you discuss any questions you have with your health care provider. Document Released: 11/29/2005 Document Revised: 06/18/2016 Document Reviewed: 05/12/2016 Elsevier Interactive Patient Education  2017 [employer].

## 2017-01-27 NOTE — Assessment & Plan Note (Signed)
Trial of NSAIDS and muscle relaxer

## 2017-01-27 NOTE — Progress Notes (Signed)
   Subjective:    Patient ID: Rebecca Cuevas is a 56 y.o. female presenting with Flank Pain and Knee Pain (left knee pain and swelling)  on 01/27/2017  HPI: Reports continued right sided pain. Normal MRI and RUQ u/s. Also with bilateral knee pain. Has known arthritis and baker's cyst. She has not had annual check up for some time with labs. Last check she had borderline diabetes. Reports random BS of > 200 yesterday with FBS of 135 this am. Pap not due last done 01/2016.  Past Medical History:  Diagnosis Date  . Hypertension    Past Surgical History:  Procedure Laterality Date  . ABDOMINAL HYSTERECTOMY    . CESAREAN SECTION    . HERNIA REPAIR     Family History  Problem Relation Age of Onset  . Hypertension Mother   . Diabetes Father   . Colon cancer Neg Hx    Social History  Substance Use Topics  . Smoking status: Current Every Day Smoker    Packs/day: 0.50  . Smokeless tobacco: Never Used     Comment: 8 cigs a day  . Alcohol use 0.0 oz/week     Comment: occasional     Review of Systems  Constitutional: Negative for chills and fever.  Respiratory: Negative for shortness of breath.   Cardiovascular: Negative for chest pain.  Gastrointestinal: Negative for abdominal pain, nausea and vomiting.  Genitourinary: Negative for dysuria.  Skin: Negative for rash.      Objective:    BP 120/60   Pulse 72   Temp 98.4 F (36.9 C) (Oral)   Ht 5\' 6"  (1.676 m)   Wt 203 lb (92.1 kg)   BMI 32.77 kg/m  Physical Exam  Constitutional: She is oriented to person, place, and time. She appears well-developed and well-nourished. No distress.  HENT:  Head: Normocephalic and atraumatic.  Eyes: No scleral icterus.  Neck: Neck supple.  Cardiovascular: Normal rate.   Pulmonary/Chest: Effort normal.  Abdominal: Soft.  Neurological: She is alert and oriented to person, place, and time.  Skin: Skin is warm and dry.  Psychiatric: She has a normal mood and affect.          Assessment & Plan:   Problem List Items Addressed This Visit      Unprioritized   Essential hypertension    HTN is well controlled. Continue 1/2 tab of HCTZ.      Relevant Medications   hydrochlorothiazide (HYDRODIURIL) 25 MG tablet   Arthritis    Trial of NSAIDS and muscle relaxer      Relevant Medications   meloxicam (MOBIC) 7.5 MG tablet   cyclobenzaprine (FLEXERIL) 10 MG tablet   Borderline diabetes    With elevated BS--needs repeat testing.       Other Visit Diagnoses    Hyperglycemia    -  Primary   Relevant Orders   POCT HgB A1C (CPT 83036) (Completed)   Routine general medical examination at a health care facility       Relevant Orders   TSH   Comprehensive metabolic panel   CBC   Lipid panel   VITAMIN D 25 Hydroxy (Vit-D Deficiency, Fractures)      Return in about 3 months (around 04/26/2017).  Donnamae Jude 01/27/2017 3:34 PM

## 2017-01-28 ENCOUNTER — Telehealth: Payer: Self-pay | Admitting: *Deleted

## 2017-01-28 LAB — VITAMIN D 25 HYDROXY (VIT D DEFICIENCY, FRACTURES): VIT D 25 HYDROXY: 11 ng/mL — AB (ref 30–100)

## 2017-01-28 NOTE — Telephone Encounter (Signed)
-----   Message from Donnamae Jude, MD sent at 01/28/2017 12:59 PM EST ----- Have patient return to discuss lab results--her blood sugar is too high

## 2017-01-28 NOTE — Telephone Encounter (Signed)
LM for patient with a female that answered the phone asking him to have her call us back.  Will try calling again on Monday.  Jazmin Hartsell,CMA

## 2017-01-31 NOTE — Telephone Encounter (Signed)
Dr. Kennon Rounds did not have anything available until 3-15 so I put pt with Dr. Lincoln Brigham on the 23rd. ep

## 2017-02-04 ENCOUNTER — Encounter: Payer: Self-pay | Admitting: Student

## 2017-02-04 ENCOUNTER — Ambulatory Visit (INDEPENDENT_AMBULATORY_CARE_PROVIDER_SITE_OTHER): Payer: BLUE CROSS/BLUE SHIELD | Admitting: Student

## 2017-02-04 VITALS — BP 110/74 | HR 71 | Temp 98.1°F | Wt 200.0 lb

## 2017-02-04 DIAGNOSIS — E119 Type 2 diabetes mellitus without complications: Secondary | ICD-10-CM | POA: Diagnosis not present

## 2017-02-04 DIAGNOSIS — L84 Corns and callosities: Secondary | ICD-10-CM | POA: Diagnosis not present

## 2017-02-04 MED ORDER — GLUCOSE BLOOD VI STRP: ORAL_STRIP | 12 refills | Status: DC

## 2017-02-04 MED ORDER — ACCU-CHEK AVIVA CONNECT W/DEVICE KIT: 1.0000 | PACK | Freq: Two times a day (BID) | 0 refills | Status: DC

## 2017-02-04 MED ORDER — ACCU-CHEK SOFTCLIX LANCET DEV MISC: 0 refills | Status: DC

## 2017-02-04 NOTE — Patient Instructions (Addendum)
Follow with Dr Kennon Rounds Make an appointment with Dr Jenne Campus with Nutrition Obtain twice as many veg's as protein or carbohydrate foods for both lunch and dinner. Get at 30 mins exercise every day

## 2017-02-04 NOTE — Assessment & Plan Note (Signed)
Wished to try diet and exercise before starting medication - diet and exercise reviewed - will refer to Dr Jenne Campus for diabetes diet - diabetes testing supplies give and she will record blood sugars - if A1c still elevated at next check, will start medication - will follow with PCP

## 2017-02-04 NOTE — Progress Notes (Signed)
   Subjective:    Patient ID: Rebecca Cuevas, female    DOB: [birth year], 56 y.o.   MRN: [id number]   CC: New Diabetes diagnosis  HPI: 56 y/o F presents for follow up A1c consistent with diabetes  Diabetes - A1c 6.8 - she has been checkig her blood sugar at work and it has been elevated to max 180 - she has not tried to  [employer] her diet or exercise  Callouses on feet - has callouses ion her feet which make walking painful  Smoking status reviewed  Review of Systems  Per HPI, else denies recent illness, fever,  chest pain, shortness of breath,     Objective:  BP 110/74   Pulse 71   Temp 98.1 F (36.7 C) (Oral)   Wt 200 lb (90.7 kg)   SpO2 99%   BMI 32.28 kg/m  Vitals and nursing note reviewed  General: NAD Cardiac: RRR,  Respiratory: CTAB, normal effort Extremities: bilateral calluses on plantar aspect of feet, no other lesions or skin defects noted. Skin: warm and dry, no rashes noted Neuro: alert and oriented, no focal deficits   Assessment & Plan:    Diabetes ([place]) Wished to try diet and exercise before starting medication - diet and exercise reviewed - will refer to Dr Jenne Campus for diabetes diet - diabetes testing supplies give and she will record blood sugars - if A1c still elevated at next check, will start medication - will follow with PCP  Callus of foot Painful foot calluses of both feet - will refer to podiatry for removal    Pressley Tadesse A. Lincoln Brigham MD, [facility] Resident PGY-3 Pager [phone]

## 2017-02-04 NOTE — Assessment & Plan Note (Signed)
Painful foot calluses of both feet - will refer to podiatry for removal

## 2017-02-07 ENCOUNTER — Telehealth: Payer: Self-pay | Admitting: *Deleted

## 2017-02-07 MED ORDER — BAYER CONTOUR NEXT MONITOR W/DEVICE KIT: 1.0000 | PACK | Freq: Two times a day (BID) | 0 refills | Status: DC

## 2017-02-07 MED ORDER — GLUCOSE BLOOD VI STRP: ORAL_STRIP | 12 refills | Status: DC

## 2017-02-07 MED ORDER — MICROLET LANCETS MISC: 1.0000 | Freq: Two times a day (BID) | 0 refills | Status: DC

## 2017-02-07 MED ORDER — MICROLET NEXT LANCING DEVICE MISC: 1.0000 | Freq: Two times a day (BID) | 1 refills | Status: DC

## 2017-02-07 NOTE — Telephone Encounter (Signed)
Prior Authorization received from [employer] for [employer]. Formulary preferred per insurance in  [place] Next monitor, contour next VI strips, Microlet next lancing device and microlet lancets. Please change diabetic supplies.  Derl Barrow, RN

## 2017-02-07 NOTE — Telephone Encounter (Signed)
Medication brands changed per request

## 2017-02-24 ENCOUNTER — Ambulatory Visit (INDEPENDENT_AMBULATORY_CARE_PROVIDER_SITE_OTHER): Payer: BLUE CROSS/BLUE SHIELD | Admitting: Family Medicine

## 2017-02-24 VITALS — BP 108/70 | HR 95 | Temp 97.9°F | Wt 202.4 lb

## 2017-02-24 DIAGNOSIS — G8929 Other chronic pain: Secondary | ICD-10-CM | POA: Diagnosis not present

## 2017-02-24 DIAGNOSIS — F17200 Nicotine dependence, unspecified, uncomplicated: Secondary | ICD-10-CM | POA: Diagnosis not present

## 2017-02-24 DIAGNOSIS — E119 Type 2 diabetes mellitus without complications: Secondary | ICD-10-CM | POA: Diagnosis not present

## 2017-02-24 DIAGNOSIS — M546 Pain in thoracic spine: Secondary | ICD-10-CM

## 2017-02-24 DIAGNOSIS — I10 Essential (primary) hypertension: Secondary | ICD-10-CM

## 2017-02-24 MED ORDER — BAYER CONTOUR NEXT MONITOR W/DEVICE KIT: 1.0000 | PACK | Freq: Two times a day (BID) | 0 refills | Status: AC

## 2017-02-24 MED ORDER — GLUCOSE BLOOD VI STRP: ORAL_STRIP | 12 refills | Status: AC

## 2017-02-24 MED ORDER — MICROLET NEXT LANCING DEVICE MISC: 1.0000 | Freq: Two times a day (BID) | 1 refills | Status: AC

## 2017-02-24 NOTE — Assessment & Plan Note (Signed)
Back exercises and back brace.

## 2017-02-24 NOTE — Assessment & Plan Note (Signed)
Great control today. 

## 2017-02-24 NOTE — Progress Notes (Signed)
   Subjective:    Patient ID: Rebecca Cuevas is a 56 y.o. female presenting with Follow-up (sugar)  on 02/24/2017  HPI: Back today. Has no prescriptions at the pharmacy, so did not pick up her meter or diabetes supplies. Has no meter. She is checking blood sugars at work. Reports BS are usually ok. She reports CBGs are 108-170, some are not true fastings.  CBGs in the day are usually ok.  Still with the back pain on the right side. Previous w/u negative. Also reports some chest pain and palpitations immediately after taking her HCTZ.  Review of Systems  Constitutional: Negative for chills and fever.  Respiratory: Negative for shortness of breath.   Cardiovascular: Negative for chest pain.  Gastrointestinal: Negative for abdominal pain, nausea and vomiting.  Genitourinary: Negative for dysuria.  Musculoskeletal: Positive for back pain.  Skin: Negative for rash.      Objective:    BP 108/70   Pulse 95   Temp 97.9 F (36.6 C) (Oral)   Wt 202 lb 6.4 oz (91.8 kg)   SpO2 95%   BMI 32.67 kg/m  Physical Exam  Constitutional: She is oriented to person, place, and time. She appears well-developed and well-nourished. No distress.  HENT:  Head: Normocephalic and atraumatic.  Eyes: No scleral icterus.  Neck: Neck supple.  Cardiovascular: Normal rate.   Pulmonary/Chest: Effort normal.  Abdominal: Soft.  Neurological: She is alert and oriented to person, place, and time.  Skin: Skin is warm and dry.  Psychiatric: She has a normal mood and affect.        Assessment & Plan:   Problem List Items Addressed This Visit      Unprioritized   TOBACCO DEPENDENCE    Wants to quit, encouraged to set date, advised to make list of alternatives to smoking. Encouraged, written and verbal instructions given.      Essential hypertension    Great control today      Diabetes ([place]) - Primary    Add in exercise--protein at bedtime snack      Relevant Medications   Lancet Devices  (MICROLET NEXT LANCING DEVICE) MISC   glucose blood (BAYER CONTOUR NEXT TEST) test strip   Blood Glucose Monitoring Suppl (BAYER CONTOUR NEXT MONITOR) w/Device KIT   RUQ pain    Back exercises and back brace.         Total face-to-face time with patient: 25 minutes. Over 50% of encounter was spent on counseling and coordination of care. Return in about 3 months (around 05/27/2017).  Donnamae Jude 02/24/2017 3:21 PM

## 2017-02-24 NOTE — Patient Instructions (Addendum)
Carbohydrate Counting for Diabetes Mellitus, Adult Carbohydrate counting is a method for keeping track of how many carbohydrates you eat. Eating carbohydrates naturally increases the amount of sugar (glucose) in the blood. Counting how many carbohydrates you eat helps keep your blood glucose within normal limits, which helps you manage your diabetes (diabetes mellitus). It is important to know how many carbohydrates you can safely have in each meal. This is different for every person. A diet and nutrition specialist (registered dietitian) can help you make a meal plan and calculate how many carbohydrates you should have at each meal and snack. Carbohydrates are found in the following foods:  Grains, such as breads and cereals.  Dried beans and soy products.  Starchy vegetables, such as potatoes, peas, and corn.  Fruit and fruit juices.  Milk and yogurt.  Sweets and snack foods, such as cake, cookies, candy, chips, and soft drinks. How do I count carbohydrates? There are two ways to count carbohydrates in food. You can use either of the methods or a combination of both. Reading "Nutrition Facts" on packaged food  The "Nutrition Facts" list is included on the labels of almost all packaged foods and beverages in the [place] It includes:  The serving size.  Information about nutrients in each serving, including the grams (g) of carbohydrate per serving. To use the "Nutrition Facts":  Decide how many servings you will have.  Multiply the number of servings by the number of carbohydrates per serving.  The resulting number is the total amount of carbohydrates that you will be having. Learning standard serving sizes of other foods  When you eat foods containing carbohydrates that are not packaged or do not include "Nutrition Facts" on the label, you need to measure the servings in order to count the amount of carbohydrates:  Measure the foods that you will eat with a food scale or measuring  cup, if needed.  Decide how many standard-size servings you will eat.  Multiply the number of servings by 15. Most carbohydrate-rich foods have about 15 g of carbohydrates per serving.  For example, if you eat 8 oz (170 g) of strawberries, you will have eaten 2 servings and 30 g of carbohydrates (2 servings x 15 g = 30 g).  For foods that have more than one food mixed, such as soups and casseroles, you must count the carbohydrates in each food that is included. The following list contains standard serving sizes of common carbohydrate-rich foods. Each of these servings has about 15 g of carbohydrates:   hamburger bun or  English muffin.   oz (15 mL) syrup.   oz (14 g) jelly.  1 slice of bread.  1 six-inch tortilla.  3 oz (85 g) cooked rice or pasta.  4 oz (113 g) cooked dried beans.  4 oz (113 g) starchy vegetable, such as peas, corn, or potatoes.  4 oz (113 g) hot cereal.  4 oz (113 g) mashed potatoes or  of a large baked potato.  4 oz (113 g) canned or frozen fruit.  4 oz (120 mL) fruit juice.  4-6 crackers.  6 chicken nuggets.  6 oz (170 g) unsweetened dry cereal.  6 oz (170 g) plain fat-free yogurt or yogurt sweetened with artificial sweeteners.  8 oz (240 mL) milk.  8 oz (170 g) fresh fruit or one small piece of fruit.  24 oz (680 g) popped popcorn. Example of carbohydrate counting Sample meal   3 oz (85 g) chicken breast.  6  oz (170 g) brown rice.  4 oz (113 g) corn.  8 oz (240 mL) milk.  8 oz (170 g) strawberries with sugar-free whipped topping. Carbohydrate calculation  1. Identify the foods that contain carbohydrates:  Rice.  Corn.  Milk.  Strawberries. 2. Calculate how many servings you have of each food:  2 servings rice.  1 serving corn.  1 serving milk.  1 serving strawberries. 3. Multiply each number of servings by 15 g:  2 servings rice x 15 g = 30 g.  1 serving corn x 15 g = 15 g.  1 serving milk x 15 g = 15  g.  1 serving strawberries x 15 g = 15 g. 4. Add together all of the amounts to find the total grams of carbohydrates eaten:  30 g + 15 g + 15 g + 15 g = 75 g of carbohydrates total. This information is not intended to replace advice given to you by your health care provider. Make sure you discuss any questions you have with your health care provider. Document Released: 11/29/2005 Document Revised: 06/18/2016 Document Reviewed: 05/12/2016 Elsevier Interactive Patient Education  2017 [employer].  Steps to Quit Smoking Smoking tobacco can be bad for your health. It can also affect almost every organ in your body. Smoking puts you and people around you at risk for many serious long-lasting (chronic) diseases. Quitting smoking is hard, but it is one of the best things that you can do for your health. It is never too late to quit. What are the benefits of quitting smoking? When you quit smoking, you lower your risk for getting serious diseases and conditions. They can include:  Lung cancer or lung disease.  Heart disease.  Stroke.  Heart attack.  Not being able to have children (infertility).  Weak bones (osteoporosis) and broken bones (fractures). If you have coughing, wheezing, and shortness of breath, those symptoms may get better when you quit. You may also get sick less often. If you are pregnant, quitting smoking can help to lower your chances of having a baby of low birth weight. What can I do to help me quit smoking? Talk with your doctor about what can help you quit smoking. Some things you can do (strategies) include:  Quitting smoking totally, instead of slowly cutting back how much you smoke over a period of time.  Going to in-person counseling. You are more likely to quit if you go to many counseling sessions.  Using resources and support systems, such as:  Online chats with a Social worker.  Phone quitlines.  [employer] Furn[employer] conservator/restorer.  Support groups or group  counseling.  Text messaging programs.  Mobile phone apps or applications.  Taking medicines. Some of these medicines may have nicotine in them. If you are pregnant or breastfeeding, do not take any medicines to quit smoking unless your doctor says it is okay. Talk with your doctor about counseling or other things that can help you. Talk with your doctor about using more than one strategy at the same time, such as taking medicines while you are also going to in-person counseling. This can help make quitting easier. What things can I do to make it easier to quit? Quitting smoking might feel very hard at first, but there is a lot that you can do to make it easier. Take these steps:  Talk to your family and friends. Ask them to support and encourage you.  Call phone quitlines, reach out to support groups, or  work with a Social worker.  Ask people who smoke to not smoke around you.  Avoid places that make you want (trigger) to smoke, such as:  Bars.  Parties.  Smoke-break areas at work.  Spend time with people who do not smoke.  Lower the stress in your life. Stress can make you want to smoke. Try these things to help your stress:  Getting regular exercise.  Deep-breathing exercises.  Yoga.  Meditating.  Doing a body scan. To do this, close your eyes, focus on one area of your body at a time from head to toe, and notice which parts of your body are tense. Try to relax the muscles in those areas.  Download or buy apps on your mobile phone or tablet that can help you stick to your quit plan. There are many free apps, such as QuitGuide from the [employer] Office manager for Disease Control and Prevention). You can find more support from smokefree.gov and other websites. This information is not intended to replace advice given to you by your health care provider. Make sure you discuss any questions you have with your health care provider. Document Released: 09/25/2009 Document Revised: 07/27/2016  Document Reviewed: 04/15/2015 Elsevier Interactive Patient Education  2017 [place].  Back Exercises If you have pain in your back, do these exercises 2-3 times each day or as told by your doctor. When the pain goes away, do the exercises once each day, but repeat the steps more times for each exercise (do more repetitions). If you do not have pain in your back, do these exercises once each day or as told by your doctor. Exercises Single Knee to Chest   Do these steps 3-5 times in a row for each leg: 1. Lie on your back on a firm bed or the floor with your legs stretched out. 2. Bring one knee to your chest. 3. Hold your knee to your chest by grabbing your knee or thigh. 4. Pull on your knee until you feel a gentle stretch in your lower back. 5. Keep doing the stretch for 10-30 seconds. 6. Slowly let go of your leg and straighten it. Pelvic Tilt   Do these steps 5-10 times in a row: 1. Lie on your back on a firm bed or the floor with your legs stretched out. 2. Bend your knees so they point up to the ceiling. Your feet should be flat on the floor. 3. Tighten your lower belly (abdomen) muscles to press your lower back against the floor. This will make your tailbone point up to the ceiling instead of pointing down to your feet or the floor. 4. Stay in this position for 5-10 seconds while you gently tighten your muscles and breathe evenly. Cat-Cow   Do these steps until your lower back bends more easily: 1. Get on your hands and knees on a firm surface. Keep your hands under your shoulders, and keep your knees under your hips. You may put padding under your knees. 2. Let your head hang down, and make your tailbone point down to the floor so your lower back is round like the back of a cat. 3. Stay in this position for 5 seconds. 4. Slowly lift your head and make your tailbone point up to the ceiling so your back hangs low (sags) like the back of a cow. 5. Stay in this position for 5  seconds. Press-Ups   Do these steps 5-10 times in a row: 1. Lie on your belly (face-down) on the  floor. 2. Place your hands near your head, about shoulder-width apart. 3. While you keep your back relaxed and keep your hips on the floor, slowly straighten your arms to raise the top half of your body and lift your shoulders. Do not use your back muscles. To make yourself more comfortable, you may change where you place your hands. 4. Stay in this position for 5 seconds. 5. Slowly return to lying flat on the floor. Bridges   Do these steps 10 times in a row: 1. Lie on your back on a firm surface. 2. Bend your knees so they point up to the ceiling. Your feet should be flat on the floor. 3. Tighten your butt muscles and lift your butt off of the floor until your waist is almost as high as your knees. If you do not feel the muscles working in your butt and the back of your thighs, slide your feet 1-2 inches farther away from your butt. 4. Stay in this position for 3-5 seconds. 5. Slowly lower your butt to the floor, and let your butt muscles relax. If this exercise is too easy, try doing it with your arms crossed over your chest. Belly Crunches   Do these steps 5-10 times in a row: 1. Lie on your back on a firm bed or the floor with your legs stretched out. 2. Bend your knees so they point up to the ceiling. Your feet should be flat on the floor. 3. Cross your arms over your chest. 4. Tip your chin a little bit toward your chest but do not bend your neck. 5. Tighten your belly muscles and slowly raise your chest just enough to lift your shoulder blades a tiny bit off of the floor. 6. Slowly lower your chest and your head to the floor. Back Lifts  Do these steps 5-10 times in a row: 1. Lie on your belly (face-down) with your arms at your sides, and rest your forehead on the floor. 2. Tighten the muscles in your legs and your butt. 3. Slowly lift your chest off of the floor while you keep your  hips on the floor. Keep the back of your head in line with the curve in your back. Look at the floor while you do this. 4. Stay in this position for 3-5 seconds. 5. Slowly lower your chest and your face to the floor. Contact a doctor if:  Your back pain gets a lot worse when you do an exercise.  Your back pain does not lessen 2 hours after you exercise. If you have any of these problems, stop doing the exercises. Do not do them again unless your doctor says it is okay. Get help right away if:  You have sudden, very bad back pain. If this happens, stop doing the exercises. Do not do them again unless your doctor says it is okay. This information is not intended to replace advice given to you by your health care provider. Make sure you discuss any questions you have with your health care provider. Document Released: 01/01/2011 Document Revised: 05/06/2016 Document Reviewed: 01/23/2015 Elsevier Interactive Patient Education  2017 [employer].

## 2017-02-24 NOTE — Assessment & Plan Note (Signed)
Wants to quit, encouraged to set date, advised to make list of alternatives to smoking. Encouraged, written and verbal instructions given.

## 2017-02-24 NOTE — Assessment & Plan Note (Signed)
Add in exercise--protein at bedtime snack

## 2017-03-24 ENCOUNTER — Ambulatory Visit: Payer: BLUE CROSS/BLUE SHIELD | Admitting: Family Medicine

## 2017-06-09 ENCOUNTER — Ambulatory Visit: Payer: BLUE CROSS/BLUE SHIELD | Admitting: Family Medicine

## 2017-06-16 ENCOUNTER — Ambulatory Visit (INDEPENDENT_AMBULATORY_CARE_PROVIDER_SITE_OTHER): Payer: BLUE CROSS/BLUE SHIELD | Admitting: Family Medicine

## 2017-06-16 ENCOUNTER — Encounter: Payer: Self-pay | Admitting: Family Medicine

## 2017-06-16 VITALS — BP 110/68 | HR 90 | Temp 98.8°F | Wt 206.0 lb

## 2017-06-16 DIAGNOSIS — L84 Corns and callosities: Secondary | ICD-10-CM | POA: Diagnosis not present

## 2017-06-16 DIAGNOSIS — E119 Type 2 diabetes mellitus without complications: Secondary | ICD-10-CM | POA: Diagnosis not present

## 2017-06-16 DIAGNOSIS — F17200 Nicotine dependence, unspecified, uncomplicated: Secondary | ICD-10-CM | POA: Diagnosis not present

## 2017-06-16 DIAGNOSIS — I10 Essential (primary) hypertension: Secondary | ICD-10-CM | POA: Diagnosis not present

## 2017-06-16 LAB — POCT GLYCOSYLATED HEMOGLOBIN (HGB A1C): Hemoglobin A1C: 7.3

## 2017-06-16 MED ORDER — METFORMIN HCL 500 MG PO TABS: 500.0000 mg | ORAL_TABLET | Freq: Two times a day (BID) | ORAL | 3 refills | Status: DC

## 2017-06-16 MED ORDER — HYDROCHLOROTHIAZIDE 25 MG PO TABS: 25.0000 mg | ORAL_TABLET | Freq: Every day | ORAL | 3 refills | Status: DC

## 2017-06-16 NOTE — Assessment & Plan Note (Signed)
Continue HCTZ BP is at goal

## 2017-06-16 NOTE — Progress Notes (Signed)
   Subjective:    Patient ID: Rebecca Cuevas is a 56 y.o. female presenting with No chief complaint on file.  on 06/16/2017  HPI: FAsting BS is high Goes down after coffee Interested in quitting smoking Reports foot calluses.  Review of Systems  Constitutional: Negative for chills and fever.  Respiratory: Negative for shortness of breath.   Cardiovascular: Negative for chest pain.  Gastrointestinal: Negative for abdominal pain, nausea and vomiting.  Genitourinary: Negative for dysuria.  Skin: Negative for rash.      Objective:    Blood pressure 110/68, pulse 90, temperature 98.8 F (37.1 C), temperature source Oral, weight 206 lb (93.4 kg), SpO2 99 %. Physical Exam  Constitutional: She is oriented to person, place, and time. She appears well-developed and well-nourished. No distress.  HENT:  Head: Normocephalic and atraumatic.  Eyes: No scleral icterus.  Neck: Neck supple.  Cardiovascular: Normal rate.   Pulmonary/Chest: Effort normal.  Abdominal: Soft.  Neurological: She is alert and oriented to person, place, and time.  Skin: Skin is warm and dry.  Psychiatric: She has a normal mood and affect.   Diabetic Foot Exam - Simple   Simple Foot Form Visual Inspection See comments:  Yes Sensation Testing Intact to touch and monofilament testing bilaterally:  Yes Pulse Check Posterior Tibialis and Dorsalis pulse intact bilaterally:  Yes Comments Calluses bilaterally    Hemoglobin A1C 7.3         Assessment & Plan:   Problem List Items Addressed This Visit      Unprioritized   TOBACCO DEPENDENCE    Smoking cessation completed today--set quit date, find alternatives, decrease # smoked/day until quit date.      Essential hypertension    Continue HCTZ BP is at goal      Relevant Medications   hydrochlorothiazide (HYDRODIURIL) 25 MG tablet   Diabetes ([facility]) - Primary    A1C is worse than last check--start Metformin      Relevant Medications   metFORMIN  (GLUCOPHAGE) 500 MG tablet   Other Relevant Orders   HgB A1c (Completed)   Ambulatory referral to Podiatry   Callus of foot    Referral to podiatry      Relevant Medications   metFORMIN (GLUCOPHAGE) 500 MG tablet      Total face-to-face time with patient: 25 minutes. Over 50% of encounter was spent on counseling and coordination of care. No Follow-up on file.  Donnamae Jude 06/16/2017 4:07 PM

## 2017-06-16 NOTE — Assessment & Plan Note (Signed)
Referral to podiatry  

## 2017-06-16 NOTE — Assessment & Plan Note (Signed)
A1C is worse than last check--start Metformin

## 2017-06-16 NOTE — Patient Instructions (Signed)
Steps to Quit Smoking Smoking tobacco can be harmful to your health and can affect almost every organ in your body. Smoking puts you, and those around you, at risk for developing many serious chronic diseases. Quitting smoking is difficult, but it is one of the best things that you can do for your health. It is never too late to quit. What are the benefits of quitting smoking? When you quit smoking, you lower your risk of developing serious diseases and conditions, such as:  Lung cancer or lung disease, such as COPD.  Heart disease.  Stroke.  Heart attack.  Infertility.  Osteoporosis and bone fractures.  Additionally, symptoms such as coughing, wheezing, and shortness of breath may get better when you quit. You may also find that you get sick less often because your body is stronger at fighting off colds and infections. If you are pregnant, quitting smoking can help to reduce your chances of having a baby of low birth weight. How do I get ready to quit? When you decide to quit smoking, create a plan to make sure that you are successful. Before you quit:  Pick a date to quit. Set a date within the next two weeks to give you time to prepare.  Write down the reasons why you are quitting. Keep this list in places where you will see it often, such as on your bathroom mirror or in your car or wallet.  Identify the people, places, things, and activities that make you want to smoke (triggers) and avoid them. Make sure to take these actions: ? Throw away all cigarettes at home, at work, and in your car. ? Throw away smoking accessories, such as ashtrays and lighters. ? Clean your car and make sure to empty the ashtray. ? Clean your home, including curtains and carpets.  Tell your family, friends, and coworkers that you are quitting. Support from your loved ones can make quitting easier.  Talk with your health care provider about your options for quitting smoking.  Find out what treatment  options are covered by your health insurance.  What strategies can I use to quit smoking? Talk with your healthcare provider about different strategies to quit smoking. Some strategies include:  Quitting smoking altogether instead of gradually lessening how much you smoke over a period of time. Research shows that quitting "cold turkey" is more successful than gradually quitting.  Attending in-person counseling to help you build problem-solving skills. You are more likely to have success in quitting if you attend several counseling sessions. Even short sessions of 10 minutes can be effective.  Finding resources and support systems that can help you to quit smoking and remain smoke-free after you quit. These resources are most helpful when you use them often. They can include: ? Online chats with a counselor. ? Telephone quitlines. ? Printed self-help materials. ? Support groups or group counseling. ? Text messaging programs. ? Mobile phone applications.  Taking medicines to help you quit smoking. (If you are pregnant or breastfeeding, talk with your health care provider first.) Some medicines contain nicotine and some do not. Both types of medicines help with cravings, but the medicines that include nicotine help to relieve withdrawal symptoms. Your health care provider may recommend: ? Nicotine patches, gum, or lozenges. ? Nicotine inhalers or sprays. ? Non-nicotine medicine that is taken by mouth.  Talk with your health care provider about combining strategies, such as taking medicines while you are also receiving in-person counseling. Using these two strategies together   makes you more likely to succeed in quitting than if you used either strategy on its own. If you are pregnant or breastfeeding, talk with your health care provider about finding counseling or other support strategies to quit smoking. Do not take medicine to help you quit smoking unless told to do so by your health care  provider. What things can I do to make it easier to quit? Quitting smoking might feel overwhelming at first, but there is a lot that you can do to make it easier. Take these important actions:  Reach out to your family and friends and ask that they support and encourage you during this time. Call telephone quitlines, reach out to support groups, or work with a counselor for support.  Ask people who smoke to avoid smoking around you.  Avoid places that trigger you to smoke, such as bars, parties, or smoke-break areas at work.  Spend time around people who do not smoke.  Lessen stress in your life, because stress can be a smoking trigger for some people. To lessen stress, try: ? Exercising regularly. ? Deep-breathing exercises. ? Yoga. ? Meditating. ? Performing a body scan. This involves closing your eyes, scanning your body from head to toe, and noticing which parts of your body are particularly tense. Purposefully relax the muscles in those areas.  Download or purchase mobile phone or tablet apps (applications) that can help you stick to your quit plan by providing reminders, tips, and encouragement. There are many free apps, such as QuitGuide from the [employer] manager for Disease Control and Prevention). You can find other support for quitting smoking (smoking cessation) through smokefree.gov and other websites.  How will I feel when I quit smoking? Within the first 24 hours of quitting smoking, you may start to feel some withdrawal symptoms. These symptoms are usually most noticeable 2-3 days after quitting, but they usually do not last beyond 2-3 weeks. Changes or symptoms that you might experience include:  Mood swings.  Restlessness, anxiety, or irritation.  Difficulty concentrating.  Dizziness.  Strong cravings for sugary foods in addition to nicotine.  Mild weight gain.  Constipation.  Nausea.  Coughing or a sore throat.  Changes in how your medicines work in your  body.  A depressed mood.  Difficulty sleeping (insomnia).  After the first 2-3 weeks of quitting, you may start to notice more positive results, such as:  Improved sense of smell and taste.  Decreased coughing and sore throat.  Slower heart rate.  Lower blood pressure.  Clearer skin.  The ability to breathe more easily.  Fewer sick days.  Quitting smoking is very challenging for most people. Do not get discouraged if you are not successful the first time. Some people need to make many attempts to quit before they achieve long-term success. Do your best to stick to your quit plan, and talk with your health care provider if you have any questions or concerns. This information is not intended to replace advice given to you by your health care provider. Make sure you discuss any questions you have with your health care provider. Document Released: 11/23/2001 Document Revised: 07/27/2016 Document Reviewed: 04/15/2015 Elsevier Interactive Patient Education  2017 [employer] for Diabetes Mellitus, Adult Carbohydrate counting is a method for keeping track of how many carbohydrates you eat. Eating carbohydrates naturally increases the amount of sugar (glucose) in the blood. Counting how many carbohydrates you eat helps keep your blood glucose within normal limits, which helps you manage  your diabetes (diabetes mellitus). It is important to know how many carbohydrates you can safely have in each meal. This is different for every person. A diet and nutrition specialist (registered dietitian) can help you make a meal plan and calculate how many carbohydrates you should have at each meal and snack. Carbohydrates are found in the following foods:  Grains, such as breads and cereals.  Dried beans and soy products.  Starchy vegetables, such as potatoes, peas, and corn.  Fruit and fruit juices.  Milk and yogurt.  Sweets and snack foods, such as cake, cookies, candy,  chips, and soft drinks.  How do I count carbohydrates? There are two ways to count carbohydrates in food. You can use either of the methods or a combination of both. Reading "Nutrition Facts" on packaged food The "Nutrition Facts" list is included on the labels of almost all packaged foods and beverages in the [place] It includes:  The serving size.  Information about nutrients in each serving, including the grams (g) of carbohydrate per serving.  To use the "Nutrition Facts":  Decide how many servings you will have.  Multiply the number of servings by the number of carbohydrates per serving.  The resulting number is the total amount of carbohydrates that you will be having.  Learning standard serving sizes of other foods When you eat foods containing carbohydrates that are not packaged or do not include "Nutrition Facts" on the label, you need to measure the servings in order to count the amount of carbohydrates:  Measure the foods that you will eat with a food scale or measuring cup, if needed.  Decide how many standard-size servings you will eat.  Multiply the number of servings by 15. Most carbohydrate-rich foods have about 15 g of carbohydrates per serving. ? For example, if you eat 8 oz (170 g) of strawberries, you will have eaten 2 servings and 30 g of carbohydrates (2 servings x 15 g = 30 g).  For foods that have more than one food mixed, such as soups and casseroles, you must count the carbohydrates in each food that is included.  The following list contains standard serving sizes of common carbohydrate-rich foods. Each of these servings has about 15 g of carbohydrates:   hamburger bun or  English muffin.   oz (15 mL) syrup.   oz (14 g) jelly.  1 slice of bread.  1 six-inch tortilla.  3 oz (85 g) cooked rice or pasta.  4 oz (113 g) cooked dried beans.  4 oz (113 g) starchy vegetable, such as peas, corn, or potatoes.  4 oz (113 g) hot cereal.  4 oz (113 g)  mashed potatoes or  of a large baked potato.  4 oz (113 g) canned or frozen fruit.  4 oz (120 mL) fruit juice.  4-6 crackers.  6 chicken nuggets.  6 oz (170 g) unsweetened dry cereal.  6 oz (170 g) plain fat-free yogurt or yogurt sweetened with artificial sweeteners.  8 oz (240 mL) milk.  8 oz (170 g) fresh fruit or one small piece of fruit.  24 oz (680 g) popped popcorn.  Example of carbohydrate counting Sample meal  3 oz (85 g) chicken breast.  6 oz (170 g) brown rice.  4 oz (113 g) corn.  8 oz (240 mL) milk.  8 oz (170 g) strawberries with sugar-free whipped topping. Carbohydrate calculation 1. Identify the foods that contain carbohydrates: ? Rice. ? Corn. ? Milk. ? Strawberries. 2. Calculate how  many servings you have of each food: ? 2 servings rice. ? 1 serving corn. ? 1 serving milk. ? 1 serving strawberries. 3. Multiply each number of servings by 15 g: ? 2 servings rice x 15 g = 30 g. ? 1 serving corn x 15 g = 15 g. ? 1 serving milk x 15 g = 15 g. ? 1 serving strawberries x 15 g = 15 g. 4. Add together all of the amounts to find the total grams of carbohydrates eaten: ? 30 g + 15 g + 15 g + 15 g = 75 g of carbohydrates total. This information is not intended to replace advice given to you by your health care provider. Make sure you discuss any questions you have with your health care provider. Document Released: 11/29/2005 Document Revised: 06/18/2016 Document Reviewed: 05/12/2016 Elsevier Interactive Patient Education  [employer].

## 2017-06-16 NOTE — Assessment & Plan Note (Signed)
Smoking cessation completed today--set quit date, find alternatives, decrease # smoked/day until quit date.

## 2017-07-20 ENCOUNTER — Ambulatory Visit (INDEPENDENT_AMBULATORY_CARE_PROVIDER_SITE_OTHER): Payer: BLUE CROSS/BLUE SHIELD | Admitting: Podiatry

## 2017-07-20 DIAGNOSIS — Q828 Other specified congenital malformations of skin: Secondary | ICD-10-CM | POA: Diagnosis not present

## 2017-07-20 DIAGNOSIS — B351 Tinea unguium: Secondary | ICD-10-CM

## 2017-07-20 DIAGNOSIS — E1169 Type 2 diabetes mellitus with other specified complication: Secondary | ICD-10-CM

## 2017-07-20 DIAGNOSIS — B353 Tinea pedis: Secondary | ICD-10-CM | POA: Diagnosis not present

## 2017-07-21 NOTE — Progress Notes (Signed)
   Subjective:    Patient ID: Rebecca Cuevas, female    DOB: 01-18-1961, 56 y.o.   MRN: [id number]  HPI this patient presents the office with chief complaint of multiple problems due to her diabetic feet. Her biggest problem is a painful callus under the ball of the left foot.  She says this callus becomes painful walking and wearing her shoes.  She says she trims on the callus herself.  She also says that she has developed tried blisters in the bottom of her right foot, which are not symptomatic today.  She also has long thick nails that are painful walking and wearing her shoes.  She presents the office today for preventative foot care services on her diabetic feet.    Review of Systems     Objective:   Physical Exam GENERAL APPEARANCE: Alert, conversant. Appropriately groomed. No acute distress.  VASCULAR: Pedal pulses are  palpable at  [facility] and PT bilateral.  Capillary refill time is immediate to all digits,  Normal temperature gradient.   NEUROLOGIC: sensation is normal to 5.07 monofilament at 5/5 sites bilateral.  Light touch is intact bilateral, Muscle strength normal.  MUSCULOSKELETAL: acceptable muscle strength, tone and stability bilateral.  HAV 1st MPJ  Left greater than right.  NAILS  Thick disfigured discolored nails with subungual debris noted.  No evidence of any bacterial infection or drainage DERMATOLOGIC: skin color, texture, and turgor are within normal limits.  No preulcerative lesions or ulcers  are seen, no interdigital maceration noted.  No open lesions present.  . No drainage noted. Dry blisters noted through the arch of the right foot.  Porokeratotic lesion sub-2 of the left foot         Assessment & Plan:  Onychomycosis  B/L  Porokeratosis left foot.  Tinea pedis right foot.  Diabetes with no foot complications.   IE  Porokeratosis left foot.   This porokeratosis was debrided.  Patient was told to pick up Lamisil over-the-counter and applied to her right foot for  athlete's foot.  Debridement of the nails was also performed.  Discussed with this patient, surgical correction for the correction of the bunion and a painful callus located on her left foot.  I told patient she would need to have a surgical consultation with the newer doctors in the practice.  Return to clinic in 3 months for preventative foot care services   Gardiner Barefoot DPM

## 2017-10-25 ENCOUNTER — Ambulatory Visit: Payer: BLUE CROSS/BLUE SHIELD | Admitting: Podiatry

## 2017-10-31 ENCOUNTER — Telehealth: Payer: Self-pay | Admitting: Family Medicine

## 2017-10-31 NOTE — Telephone Encounter (Signed)
Pt called to cancel and reschedule her physical for 11/20, but told her their were no openings for her PCP the rest of November and her December schedule is not out yet. She would like to speak to her dr or her nurse about when to come in. Please advise

## 2017-10-31 NOTE — Telephone Encounter (Signed)
LM for patient at number listed and she was not available.  Please inform patient when she calls back that she will need to check with [place] next week to see if Dr. Virginia Crews schedule is out.  I am unsure of what dates she will be here in December and can't make any appointments until they are opened. Jazmin Hartsell,CMA

## 2017-11-01 ENCOUNTER — Ambulatory Visit: Payer: BLUE CROSS/BLUE SHIELD | Admitting: Family Medicine

## 2017-11-22 ENCOUNTER — Other Ambulatory Visit: Payer: Self-pay | Admitting: Family Medicine

## 2017-11-22 DIAGNOSIS — I10 Essential (primary) hypertension: Secondary | ICD-10-CM

## 2017-11-23 ENCOUNTER — Other Ambulatory Visit: Payer: Self-pay

## 2017-11-23 ENCOUNTER — Ambulatory Visit (INDEPENDENT_AMBULATORY_CARE_PROVIDER_SITE_OTHER): Payer: BLUE CROSS/BLUE SHIELD | Admitting: Family Medicine

## 2017-11-23 ENCOUNTER — Encounter: Payer: Self-pay | Admitting: Family Medicine

## 2017-11-23 VITALS — BP 140/64 | HR 95 | Temp 98.6°F | Ht 66.0 in | Wt 209.0 lb

## 2017-11-23 DIAGNOSIS — L84 Corns and callosities: Secondary | ICD-10-CM

## 2017-11-23 DIAGNOSIS — B353 Tinea pedis: Secondary | ICD-10-CM

## 2017-11-23 DIAGNOSIS — E119 Type 2 diabetes mellitus without complications: Secondary | ICD-10-CM

## 2017-11-23 DIAGNOSIS — I10 Essential (primary) hypertension: Secondary | ICD-10-CM | POA: Diagnosis not present

## 2017-11-23 LAB — POCT GLYCOSYLATED HEMOGLOBIN (HGB A1C): Hemoglobin A1C: 7

## 2017-11-23 MED ORDER — NYSTATIN 100000 UNIT/GM EX POWD: Freq: Four times a day (QID) | CUTANEOUS | 0 refills | Status: DC

## 2017-11-23 MED ORDER — HYDROCHLOROTHIAZIDE 25 MG PO TABS: 25.0000 mg | ORAL_TABLET | Freq: Every day | ORAL | 3 refills | Status: DC

## 2017-11-23 MED ORDER — METFORMIN HCL 500 MG PO TABS: 500.0000 mg | ORAL_TABLET | Freq: Two times a day (BID) | ORAL | 3 refills | Status: DC

## 2017-11-23 NOTE — Patient Instructions (Signed)

## 2017-11-23 NOTE — Assessment & Plan Note (Signed)
May use callous pads

## 2017-11-23 NOTE — Assessment & Plan Note (Signed)
A1 C is slightly better, continue diet and metformin.

## 2017-11-23 NOTE — Assessment & Plan Note (Signed)
Continue HCTZ

## 2017-11-23 NOTE — Progress Notes (Signed)
   Subjective:    Patient ID: Rebecca Cuevas is a 56 y.o. female presenting with Diabetes and Medication Refill  on 11/23/2017  HPI: Here for 3 month f/u. Has run out of BP meds and none x 2 days. She is taking her metformin and has added B12, vit C and MVI. Feels much better. Watching diet and salt intake.Taking Metformin only once daily. Told by podiatrist that she has tinea pedis.  Review of Systems  Constitutional: Negative for chills and fever.  Respiratory: Negative for shortness of breath.   Cardiovascular: Negative for chest pain.  Gastrointestinal: Negative for abdominal pain, nausea and vomiting.  Genitourinary: Negative for dysuria.  Skin: Negative for rash.      Objective:    BP 140/64   Pulse 95   Temp 98.6 F (37 C) (Oral)   Ht 5\' 6"  (1.676 m)   Wt 209 lb (94.8 kg)   SpO2 99%   BMI 33.73 kg/m  Physical Exam  Constitutional: She is oriented to person, place, and time. She appears well-developed and well-nourished. No distress.  HENT:  Head: Normocephalic and atraumatic.  Eyes: No scleral icterus.  Neck: Neck supple.  Cardiovascular: Normal rate.  Pulmonary/Chest: Effort normal.  Abdominal: Soft.  Neurological: She is alert and oriented to person, place, and time.  Skin: Skin is warm and dry.  Psychiatric: She has a normal mood and affect.   Hemoglobin A1C 7.0       Assessment & Plan:   Problem List Items Addressed This Visit      Unprioritized   Essential hypertension    Continue HCTZ      Relevant Medications   hydrochlorothiazide (HYDRODIURIL) 25 MG tablet   Diabetes ([place]) - Primary    A1 C is slightly better, continue diet and metformin.      Relevant Medications   metFORMIN (GLUCOPHAGE) 500 MG tablet   Other Relevant Orders   HgB A1c (Completed)   Urine Microalbumin w/creat. ratio   Callus of foot    May use callous pads      Relevant Medications   metFORMIN (GLUCOPHAGE) 500 MG tablet    Other Visit Diagnoses    Tinea pedis  of both feet       Nystatin powder   Relevant Medications   nystatin (MYCOSTATIN/NYSTOP) powder      Total face-to-face time with patient: 25 minutes. Over 50% of encounter was spent on counseling and coordination of care. Return in about 3 months (around 02/21/2018).  Donnamae Jude 11/23/2017 3:33 PM

## 2017-11-24 LAB — MICROALBUMIN / CREATININE URINE RATIO
CREATININE, UR: 251.1 mg/dL
MICROALB/CREAT RATIO: 2.4 mg/g{creat} (ref 0.0–30.0)
MICROALBUM., U, RANDOM: 6 ug/mL

## 2018-01-12 ENCOUNTER — Encounter: Payer: Self-pay | Admitting: Family Medicine

## 2018-01-16 ENCOUNTER — Other Ambulatory Visit: Payer: Self-pay

## 2018-01-16 ENCOUNTER — Encounter (HOSPITAL_COMMUNITY): Payer: Self-pay | Admitting: Emergency Medicine

## 2018-01-16 ENCOUNTER — Ambulatory Visit (HOSPITAL_COMMUNITY)
Admission: EM | Admit: 2018-01-16 | Discharge: 2018-01-16 | Disposition: A | Discharge status: Home / Self Care | Payer: BLUE CROSS/BLUE SHIELD | Attending: Family Medicine | Admitting: Family Medicine

## 2018-01-16 DIAGNOSIS — T1490XD Injury, unspecified, subsequent encounter: Secondary | ICD-10-CM | POA: Diagnosis not present

## 2018-01-16 DIAGNOSIS — L02221 Furuncle of abdominal wall: Secondary | ICD-10-CM | POA: Diagnosis not present

## 2018-01-16 HISTORY — DX: Type 2 diabetes mellitus without complications: E11.9

## 2018-01-16 MED ORDER — MUPIROCIN 2 % EX OINT: 1.0000 "application " | TOPICAL_OINTMENT | Freq: Two times a day (BID) | CUTANEOUS | 0 refills | Status: DC

## 2018-01-16 NOTE — Discharge Instructions (Signed)
No drainable abscess today.  Appearance inconsistent with shingles.  You can apply Bactroban for the next 2-3 days to prevent infection.  Follow-up with PCP as scheduled next week.  If experiencing worsening symptoms, fever, spreading redness, increased warmth, spreading rash, follow up for reevaluation.

## 2018-01-16 NOTE — ED Triage Notes (Signed)
The patient presented to the [facility] with a complaint of a possible abscess on her right flank area x 1 week.

## 2018-01-16 NOTE — ED Provider Notes (Signed)
[facility]    CSN: [id number] Arrival date & time: 01/16/18  1008     History   Chief Complaint Chief Complaint  Patient presents with  . Abscess    HPI Rebecca Cuevas is a 57 y.o. female.   57-year-old female with history of HTN, DM comes in for 1 week history of possible abscess to her left flank area. States it itched at first, and then started being painful. Noticed it drain slightly on its own.  Denies fever, chills, night sweats.  Patient states worried about abscess, also worried about shingles.  She denies any spreading erythema, increased warmth, spreading rash.  She has not tried anything for it.      Past Medical History:  Diagnosis Date  . Diabetes mellitus without complication (HCC)   . Hypertension     Patient Active Problem List   Diagnosis Date Noted  . Callus of foot 02/04/2017  . Diabetes (HCC) 11/22/2016  . Thoracic back pain 11/22/2016  . Tachycardia 04/29/2014  . Arthritis 08/23/2011  . Obesity, unspecified 04/29/2009  . Essential hypertension 04/29/2009  . TOBACCO DEPENDENCE 02/09/2007  . Eczema 02/09/2007    Past Surgical History:  Procedure Laterality Date  . ABDOMINAL HYSTERECTOMY    . CESAREAN SECTION    . HERNIA REPAIR      OB History    No data available       Home Medications    Prior to Admission medications   Medication Sig Start Date End Date Taking? Authorizing Provider  alum & mag hydroxide-simeth (MAALOX/MYLANTA) 200-200-20 MG/5ML suspension Take by mouth every 6 (six) hours as needed for indigestion or heartburn.    [provider]  Blood Glucose Monitoring Suppl (BAYER CONTOUR NEXT MONITOR) w/Device KIT 1 Device by Does not apply route 2 (two) times daily. 02/24/17   Pratt, Tanya S, MD  glucose blood (BAYER CONTOUR NEXT TEST) test strip Use as instructed 02/24/17   Pratt, Tanya S, MD  hydrochlorothiazide (HYDRODIURIL) 25 MG tablet Take 1 tablet (25 mg total) by mouth daily. 11/23/17   Pratt, Tanya  S, MD  hydrocortisone cream 1 % APPLY ONE APPLICATION TOPICALLY ONCE DAILY AS NEEDED FOR ITCHING (FOR ECZEMA). 05/19/16   Pratt, Tanya S, MD  Lancet Devices (MICROLET NEXT LANCING DEVICE) MISC 1 Device by Does not apply route 2 (two) times daily. 02/24/17   Pratt, Tanya S, MD  metFORMIN (GLUCOPHAGE) 500 MG tablet Take 1 tablet (500 mg total) by mouth 2 (two) times daily with a meal. 11/23/17   Pratt, Tanya S, MD  Multiple Vitamins-Minerals (COMPLETE MULTIVITAMIN/MINERAL PO) Take by mouth.    [provider]  mupirocin ointment (BACTROBAN) 2 % Apply 1 application topically 2 (two) times daily. 01/16/18   Yu, Amy V, PA-C  nystatin (MYCOSTATIN/NYSTOP) powder Apply topically 4 (four) times daily. 11/23/17   Pratt, Tanya S, MD  vitamin B-12 (CYANOCOBALAMIN) 500 MCG tablet Take 500 mcg by mouth daily.    [provider]  vitamin C (ASCORBIC ACID) 500 MG tablet Take 500 mg by mouth daily.    [provider]    Family History Family History  Problem Relation Age of Onset  . Hypertension Mother   . Diabetes Father   . Colon cancer Neg Hx     Social History Social History   Tobacco Use  . Smoking status: Current Every Day Smoker    Packs/day: 0.50  . Smokeless tobacco: Never Used  . Tobacco comment: 8 cigs a day    Substance Use Topics  . Alcohol use: Yes    Alcohol/week: 0.0 oz    Comment: occasional  . Drug use: No     Allergies   Patient has no known allergies.   Review of Systems Review of Systems  Reason unable to perform ROS: See HPI as above.     Physical Exam Triage Vital Signs ED Triage Vitals  Enc Vitals Group     BP 01/16/18 1027 113/76     Pulse Rate 01/16/18 1027 97     Resp 01/16/18 1027 18     Temp 01/16/18 1027 (!) 97.3 F (36.3 C)     Temp Source 01/16/18 1027 Oral     SpO2 01/16/18 1027 97 %     Weight --      Height --      Head Circumference --      Peak Flow --      Pain Score 01/16/18 1033 5     Pain Loc --      Pain Edu?  --      Excl. in [place]? --    No data found.  Updated Vital Signs BP 113/76 (BP Location: Left Arm)   Pulse 97   Temp (!) 97.3 F (36.3 C) (Oral) Comment: PT sts was drinking cold tea in waiting room  Resp 18   SpO2 97%   Physical Exam  Constitutional: She is oriented to person, place, and time. She appears well-developed and well-nourished. No distress.  HENT:  Head: Normocephalic and atraumatic.  Eyes: Conjunctivae are normal. Pupils are equal, round, and reactive to light.  Neurological: She is alert and oriented to person, place, and time.  Skin:  See picture below.  Healing wound.  No erythema, increased warmth.  No tenderness on palpation.  No fluctuance felt.  No other rashes seen in the area.       UC Treatments / Results  Labs (all labs ordered are listed, but only abnormal results are displayed) Labs Reviewed - No data to display  Lab Results  Component Value Date   HGBA1C 7.0 11/23/2017     EKG  EKG Interpretation None       Radiology No results found.  Procedures Procedures (including critical care time)  Medications Ordered in UC Medications - No data to display   Initial Impression / Assessment and Plan / UC Course  I have reviewed the triage vital signs and the nursing notes.  Pertinent labs & imaging results that were available during my care of the patient were reviewed by me and considered in my medical decision making (see chart for details).    No drainable abscess.  Discussed with patient appearance and consistent with shingles.  No obvious cellulitis today.  Patient can apply Bactroban ointment for the next 2-3 days for healing wound. Keep area clean and dry.  Return precautions given.  Follow-up with PCP as scheduled in a week.  Patient expresses understanding and agrees to plan.  Final Clinical Impressions(s) / UC Diagnoses   Final diagnoses:  Healing wound    ED Discharge Orders        Ordered    mupirocin ointment  (BACTROBAN) 2 %  2 times daily     01/16/18 1042        Ok Edwards, [place] 01/16/18 1049

## 2018-01-25 ENCOUNTER — Other Ambulatory Visit: Payer: Self-pay

## 2018-01-25 ENCOUNTER — Ambulatory Visit: Payer: BLUE CROSS/BLUE SHIELD | Admitting: Family Medicine

## 2018-01-25 ENCOUNTER — Encounter: Payer: Self-pay | Admitting: Family Medicine

## 2018-01-25 VITALS — BP 118/74 | HR 99 | Temp 98.1°F | Ht 66.0 in | Wt 203.0 lb

## 2018-01-25 DIAGNOSIS — E119 Type 2 diabetes mellitus without complications: Secondary | ICD-10-CM | POA: Diagnosis not present

## 2018-01-25 DIAGNOSIS — L0292 Furuncle, unspecified: Secondary | ICD-10-CM

## 2018-01-25 NOTE — Patient Instructions (Signed)
Skin Abscess A skin abscess is an infected area on or under your skin that contains pus and other material. An abscess can happen almost anywhere on your body. Some abscesses break open (rupture) on their own. Most continue to get worse unless they are treated. The infection can spread deeper into the body and into your blood, which can make you feel sick. Treatment usually involves draining the abscess. Follow these instructions at home: Abscess Care  If you have an abscess that has not drained, place a warm, clean, wet washcloth over the abscess several times a day. Do this as told by your doctor.  Follow instructions from your doctor about how to take care of your abscess. Make sure you: ? Cover the abscess with a bandage (dressing). ? Change your bandage or gauze as told by your doctor. ? Wash your hands with soap and water before you change the bandage or gauze. If you cannot use soap and water, use hand sanitizer.  Check your abscess every day for signs that the infection is getting worse. Check for: ? More redness, swelling, or pain. ? More fluid or blood. ? Warmth. ? More pus or a bad smell. Medicines   Take over-the-counter and prescription medicines only as told by your doctor.  If you were prescribed an antibiotic medicine, take it as told by your doctor. Do not stop taking the antibiotic even if you start to feel better. General instructions  To avoid spreading the infection: ? Do not share personal care items, towels, or hot tubs with others. ? Avoid making skin-to-skin contact with other people.  Keep all follow-up visits as told by your doctor. This is important. Contact a doctor if:  You have more redness, swelling, or pain around your abscess.  You have more fluid or blood coming from your abscess.  Your abscess feels warm when you touch it.  You have more pus or a bad smell coming from your abscess.  You have a fever.  Your muscles ache.  You have  chills.  You feel sick. Get help right away if:  You have very bad (severe) pain.  You see red streaks on your skin spreading away from the abscess. This information is not intended to replace advice given to you by your health care provider. Make sure you discuss any questions you have with your health care provider. Document Released: 05/17/2008 Document Revised: 07/25/2016 Document Reviewed: 10/08/2015 Elsevier Interactive Patient Education  2018 Elsevier Inc.  

## 2018-01-25 NOTE — Assessment & Plan Note (Signed)
Reports CBGs in the 100 range--

## 2018-01-25 NOTE — Progress Notes (Signed)
   Subjective:    Patient ID: Rebecca Cuevas is a 57 y.o. female presenting with Recurrent Skin Infections  on 01/25/2018  HPI: Seen in urgent care with boil. Told to f/u. Used antibiotic topical cream. Wants to know why these keep coming up. Has a second one in her groin.  Review of Systems  Constitutional: Negative for chills and fever.  Respiratory: Negative for shortness of breath.   Cardiovascular: Negative for chest pain.  Gastrointestinal: Negative for abdominal pain, nausea and vomiting.  Genitourinary: Negative for dysuria.  Skin: Negative for rash.      Objective:    BP 118/74   Pulse 99   Temp 98.1 F (36.7 C) (Oral)   Ht 5\' 6"  (1.676 m)   Wt 203 lb (92.1 kg)   SpO2 99%   BMI 32.77 kg/m  Physical Exam  Constitutional: She is oriented to person, place, and time. She appears well-developed and well-nourished. No distress.  HENT:  Head: Normocephalic and atraumatic.  Eyes: No scleral icterus.  Neck: Neck supple.  Cardiovascular: Normal rate.  Pulmonary/Chest: Effort normal.  Abdominal: Soft.  Neurological: She is alert and oriented to person, place, and time.  Skin: Skin is warm and dry.  Small firm nodule noted at lower back. No erythema, no drainage.  Psychiatric: She has a normal mood and affect.        Assessment & Plan:   Problem List Items Addressed This Visit      Unprioritized   Diabetes ([place])    Reports CBGs in the 100 range--       Other Visit Diagnoses    Boil    -  Primary   reviewed anti-bacterial soap-excellent glycemic control.      Total face-to-face time with patient: 10 minutes. Over 50% of encounter was spent on counseling and coordination of care. Return in about 4 months (around 05/25/2018).  Donnamae Jude 01/25/2018 4:02 PM

## 2018-03-06 LAB — HM DIABETES EYE EXAM

## 2018-04-06 ENCOUNTER — Encounter: Payer: Self-pay | Admitting: Family Medicine

## 2018-04-27 ENCOUNTER — Telehealth: Payer: Self-pay | Admitting: Family Medicine

## 2018-04-27 NOTE — Telephone Encounter (Signed)
LVM for pt to call back and make an appointment with Dr. Kennon Rounds in June for her physical. Pt called a couple of days ago to make this appointment but [facility]'s schedule was not in for June. Please assist pt in making this appt when she calls back.

## 2018-05-18 ENCOUNTER — Encounter: Payer: Self-pay | Admitting: Family Medicine

## 2018-05-18 ENCOUNTER — Ambulatory Visit (INDEPENDENT_AMBULATORY_CARE_PROVIDER_SITE_OTHER): Payer: BLUE CROSS/BLUE SHIELD | Admitting: Family Medicine

## 2018-05-18 ENCOUNTER — Other Ambulatory Visit: Payer: Self-pay

## 2018-05-18 VITALS — BP 104/60 | HR 100 | Temp 98.2°F | Ht 66.0 in | Wt 203.0 lb

## 2018-05-18 DIAGNOSIS — Z1239 Encounter for other screening for malignant neoplasm of breast: Secondary | ICD-10-CM

## 2018-05-18 DIAGNOSIS — Z1231 Encounter for screening mammogram for malignant neoplasm of breast: Secondary | ICD-10-CM

## 2018-05-18 DIAGNOSIS — F17200 Nicotine dependence, unspecified, uncomplicated: Secondary | ICD-10-CM

## 2018-05-18 DIAGNOSIS — E119 Type 2 diabetes mellitus without complications: Secondary | ICD-10-CM

## 2018-05-18 DIAGNOSIS — L84 Corns and callosities: Secondary | ICD-10-CM | POA: Diagnosis not present

## 2018-05-18 DIAGNOSIS — I10 Essential (primary) hypertension: Secondary | ICD-10-CM | POA: Diagnosis not present

## 2018-05-18 DIAGNOSIS — E1100 Type 2 diabetes mellitus with hyperosmolarity without nonketotic hyperglycemic-hyperosmolar coma (NKHHC): Secondary | ICD-10-CM | POA: Diagnosis not present

## 2018-05-18 LAB — POCT GLYCOSYLATED HEMOGLOBIN (HGB A1C): HBA1C, POC (CONTROLLED DIABETIC RANGE): 7.4 % — AB (ref 0.0–7.0)

## 2018-05-18 MED ORDER — METFORMIN HCL 500 MG PO TABS: 500.0000 mg | ORAL_TABLET | Freq: Two times a day (BID) | ORAL | 3 refills | Status: DC

## 2018-05-18 MED ORDER — HYDROCHLOROTHIAZIDE 25 MG PO TABS: 25.0000 mg | ORAL_TABLET | Freq: Every day | ORAL | 3 refills | Status: DC

## 2018-05-18 MED ORDER — NICOTINE 14 MG/24HR TD PT24: 14.0000 mg | MEDICATED_PATCH | Freq: Every day | TRANSDERMAL | 0 refills | Status: DC

## 2018-05-18 NOTE — Patient Instructions (Addendum)
Diabetes Mellitus and Nutrition When you have diabetes (diabetes mellitus), it is very important to have healthy eating habits because your blood sugar (glucose) levels are greatly affected by what you eat and drink. Eating healthy foods in the appropriate amounts, at about the same times every day, can help you:  Control your blood glucose.  Lower your risk of heart disease.  Improve your blood pressure.  Reach or maintain a healthy weight.  Every person with diabetes is different, and each person has different needs for a meal plan. Your health care provider may recommend that you work with a diet and nutrition specialist (dietitian) to make a meal plan that is best for you. Your meal plan may vary depending on factors such as:  The calories you need.  The medicines you take.  Your weight.  Your blood glucose, blood pressure, and cholesterol levels.  Your activity level.  Other health conditions you have, such as heart or kidney disease.  How do carbohydrates affect me? Carbohydrates affect your blood glucose level more than any other type of food. Eating carbohydrates naturally increases the amount of glucose in your blood. Carbohydrate counting is a method for keeping track of how many carbohydrates you eat. Counting carbohydrates is important to keep your blood glucose at a healthy level, especially if you use insulin or take certain oral diabetes medicines. It is important to know how many carbohydrates you can safely have in each meal. This is different for every person. Your dietitian can help you calculate how many carbohydrates you should have at each meal and for snack. Foods that contain carbohydrates include:  Bread, cereal, rice, pasta, and crackers.  Potatoes and corn.  Peas, beans, and lentils.  Milk and yogurt.  Fruit and juice.  Desserts, such as cakes, cookies, ice cream, and candy.  How does alcohol affect me? Alcohol can cause a sudden decrease in blood  glucose (hypoglycemia), especially if you use insulin or take certain oral diabetes medicines. Hypoglycemia can be a life-threatening condition. Symptoms of hypoglycemia (sleepiness, dizziness, and confusion) are similar to symptoms of having too much alcohol. If your health care provider says that alcohol is safe for you, follow these guidelines:  Limit alcohol intake to no more than 1 drink per day for nonpregnant women and 2 drinks per day for men. One drink equals 12 oz of beer, 5 oz of wine, or 1 oz of hard liquor.  Do not drink on an empty stomach.  Keep yourself hydrated with water, diet soda, or unsweetened iced tea.  Keep in mind that regular soda, juice, and other mixers may contain a lot of sugar and must be counted as carbohydrates.  What are tips for following this plan? Reading food labels  Start by checking the serving size on the label. The amount of calories, carbohydrates, fats, and other nutrients listed on the label are based on one serving of the food. Many foods contain more than one serving per package.  Check the total grams (g) of carbohydrates in one serving. You can calculate the number of servings of carbohydrates in one serving by dividing the total carbohydrates by 15. For example, if a food has 30 g of total carbohydrates, it would be equal to 2 servings of carbohydrates.  Check the number of grams (g) of saturated and trans fats in one serving. Choose foods that have low or no amount of these fats.  Check the number of milligrams (mg) of sodium in one serving. Most people   should limit total sodium intake to less than 2,300 mg per day.  Always check the nutrition information of foods labeled as "low-fat" or "nonfat". These foods may be higher in added sugar or refined carbohydrates and should be avoided.  Talk to your dietitian to identify your daily goals for nutrients listed on the label. Shopping  Avoid buying canned, premade, or processed foods. These  foods tend to be high in fat, sodium, and added sugar.  Shop around the outside edge of the grocery store. This includes fresh fruits and vegetables, bulk grains, fresh meats, and fresh dairy. Cooking  Use low-heat cooking methods, such as baking, instead of high-heat cooking methods like deep frying.  Cook using healthy oils, such as olive, canola, or sunflower oil.  Avoid cooking with butter, cream, or high-fat meats. Meal planning  Eat meals and snacks regularly, preferably at the same times every day. Avoid going long periods of time without eating.  Eat foods high in fiber, such as fresh fruits, vegetables, beans, and whole grains. Talk to your dietitian about how many servings of carbohydrates you can eat at each meal.  Eat 4-6 ounces of lean protein each day, such as lean meat, chicken, fish, eggs, or tofu. 1 ounce is equal to 1 ounce of meat, chicken, or fish, 1 egg, or 1/4 cup of tofu.  Eat some foods each day that contain healthy fats, such as avocado, nuts, seeds, and fish. Lifestyle   Check your blood glucose regularly.  Exercise at least 30 minutes 5 or more days each week, or as told by your health care provider.  Take medicines as told by your health care provider.  Do not use any products that contain nicotine or tobacco, such as cigarettes and e-cigarettes. If you need help quitting, ask your health care provider.  Work with a counselor or diabetes educator to identify strategies to manage stress and any emotional and social challenges. What are some questions to ask my health care provider?  Do I need to meet with a diabetes educator?  Do I need to meet with a dietitian?  What number can I call if I have questions?  When are the best times to check my blood glucose? Where to find more information:  American Diabetes Association: diabetes.org/food-and-fitness/food  Academy of Nutrition and Dietetics:  www.eatright.org/resources/health/diseases-and-conditions/diabetes  National Institute of Diabetes and Digestive and Kidney Diseases (NIH): www.niddk.nih.gov/health-information/diabetes/overview/diet-eating-physical-activity Summary  A healthy meal plan will help you control your blood glucose and maintain a healthy lifestyle.  Working with a diet and nutrition specialist (dietitian) can help you make a meal plan that is best for you.  Keep in mind that carbohydrates and alcohol have immediate effects on your blood glucose levels. It is important to count carbohydrates and to use alcohol carefully. This information is not intended to replace advice given to you by your health care provider. Make sure you discuss any questions you have with your health care provider. Document Released: 08/26/2005 Document Revised: 01/03/2017 Document Reviewed: 01/03/2017 Elsevier Interactive Patient Education  2018 Elsevier Inc.  Steps to Quit Smoking Smoking tobacco can be bad for your health. It can also affect almost every organ in your body. Smoking puts you and people around you at risk for many serious long-lasting (chronic) diseases. Quitting smoking is hard, but it is one of the best things that you can do for your health. It is never too late to quit. What are the benefits of quitting smoking? When you quit smoking, you lower   your risk for getting serious diseases and conditions. They can include:  Lung cancer or lung disease.  Heart disease.  Stroke.  Heart attack.  Not being able to have children (infertility).  Weak bones (osteoporosis) and broken bones (fractures).  If you have coughing, wheezing, and shortness of breath, those symptoms may get better when you quit. You may also get sick less often. If you are pregnant, quitting smoking can help to lower your chances of having a baby of low birth weight. What can I do to help me quit smoking? Talk with your doctor about what can help you  quit smoking. Some things you can do (strategies) include:  Quitting smoking totally, instead of slowly cutting back how much you smoke over a period of time.  Going to in-person counseling. You are more likely to quit if you go to many counseling sessions.  Using resources and support systems, such as: ? Online chats with a counselor. ? Phone quitlines. ? Printed self-help materials. ? Support groups or group counseling. ? Text messaging programs. ? Mobile phone apps or applications.  Taking medicines. Some of these medicines may have nicotine in them. If you are pregnant or breastfeeding, do not take any medicines to quit smoking unless your doctor says it is okay. Talk with your doctor about counseling or other things that can help you.  Talk with your doctor about using more than one strategy at the same time, such as taking medicines while you are also going to in-person counseling. This can help make quitting easier. What things can I do to make it easier to quit? Quitting smoking might feel very hard at first, but there is a lot that you can do to make it easier. Take these steps:  Talk to your family and friends. Ask them to support and encourage you.  Call phone quitlines, reach out to support groups, or work with a counselor.  Ask people who smoke to not smoke around you.  Avoid places that make you want (trigger) to smoke, such as: ? Bars. ? Parties. ? Smoke-break areas at work.  Spend time with people who do not smoke.  Lower the stress in your life. Stress can make you want to smoke. Try these things to help your stress: ? Getting regular exercise. ? Deep-breathing exercises. ? Yoga. ? Meditating. ? Doing a body scan. To do this, close your eyes, focus on one area of your body at a time from head to toe, and notice which parts of your body are tense. Try to relax the muscles in those areas.  Download or buy apps on your mobile phone or tablet that can help you  stick to your quit plan. There are many free apps, such as QuitGuide from the CDC (Centers for Disease Control and Prevention). You can find more support from smokefree.gov and other websites.  This information is not intended to replace advice given to you by your health care provider. Make sure you discuss any questions you have with your health care provider. Document Released: 09/25/2009 Document Revised: 07/27/2016 Document Reviewed: 04/15/2015 Elsevier Interactive Patient Education  2018 Elsevier Inc.  

## 2018-05-18 NOTE — Assessment & Plan Note (Signed)
Resume metformin

## 2018-05-18 NOTE — Assessment & Plan Note (Signed)
Well controlled, continue HCTZ

## 2018-05-18 NOTE — Assessment & Plan Note (Signed)
Smoking and tobacco cessation was discussed at today's visit for 5 minutes. Set plan for what to do instead of smoking. Trial of patch.

## 2018-05-18 NOTE — Progress Notes (Signed)
   Subjective:    Patient ID: Rebecca Cuevas is a 57 y.o. female presenting with Annual Exam and Diabetes  on 05/18/2018  HPI: Here today for f/u. Has not been taking her metformin due to diarrhea. Notes she desires to quit smoking. Taking her BP meds.  Recent labs ok. Reports pain in her side which limits how far she can walk. Wants handicap sticker for car.  Review of Systems  Constitutional: Negative for chills and fever.  Respiratory: Negative for shortness of breath.   Cardiovascular: Negative for chest pain.  Gastrointestinal: Negative for abdominal pain, nausea and vomiting.  Genitourinary: Negative for dysuria.  Skin: Negative for rash.      Objective:    BP 104/60   Pulse 100   Temp 98.2 F (36.8 C) (Oral)   Ht 5\' 6"  (1.676 m)   Wt 203 lb (92.1 kg)   SpO2 97%   BMI 32.77 kg/m  Physical Exam  Constitutional: She is oriented to person, place, and time. She appears well-developed and well-nourished. No distress.  HENT:  Head: Normocephalic and atraumatic.  Eyes: No scleral icterus.  Neck: Neck supple.  Cardiovascular: Normal rate and regular rhythm.  No murmur heard. Pulmonary/Chest: Effort normal and breath sounds normal.  Abdominal: Soft.  Neurological: She is alert and oriented to person, place, and time.  Skin: Skin is warm and dry. No rash noted.  Psychiatric: She has a normal mood and affect.        Assessment & Plan:   Problem List Items Addressed This Visit      Unprioritized   TOBACCO DEPENDENCE    Smoking and tobacco cessation was discussed at today's visit for 5 minutes. Set plan for what to do instead of smoking. Trial of patch.        Relevant Medications   nicotine (NICODERM CQ) 14 mg/24hr patch   Essential hypertension    Well controlled, continue HCTZ      Relevant Medications   hydrochlorothiazide (HYDRODIURIL) 25 MG tablet   Diabetes ([facility]) - Primary    Resume metformin      Relevant Medications   metFORMIN (GLUCOPHAGE)  500 MG tablet   Other Relevant Orders   HgB A1c (Completed)   Callus of foot   Relevant Medications   metFORMIN (GLUCOPHAGE) 500 MG tablet    Other Visit Diagnoses    Screening for breast cancer       Relevant Orders   MM DIGITAL SCREENING BILATERAL      Total face-to-face time with patient: 25 minutes. Over 50% of encounter was spent on counseling and coordination of care. Return in about 6 months (around 11/17/2018).  Donnamae Jude 05/18/2018 3:23 PM

## 2018-06-12 ENCOUNTER — Ambulatory Visit: Payer: BLUE CROSS/BLUE SHIELD

## 2018-06-13 ENCOUNTER — Ambulatory Visit: Payer: BLUE CROSS/BLUE SHIELD

## 2018-07-03 ENCOUNTER — Ambulatory Visit
Admission: RE | Admit: 2018-07-03 | Discharge: 2018-07-03 | Disposition: A | Discharge status: Home / Self Care | Payer: BLUE CROSS/BLUE SHIELD | Source: Ambulatory Visit | Attending: Family Medicine | Admitting: Family Medicine

## 2018-07-03 DIAGNOSIS — Z1239 Encounter for other screening for malignant neoplasm of breast: Secondary | ICD-10-CM

## 2018-07-15 ENCOUNTER — Ambulatory Visit (HOSPITAL_COMMUNITY)
Admission: EM | Admit: 2018-07-15 | Discharge: 2018-07-15 | Disposition: A | Discharge status: Home / Self Care | Payer: BLUE CROSS/BLUE SHIELD | Attending: Family Medicine | Admitting: Family Medicine

## 2018-07-15 ENCOUNTER — Encounter (HOSPITAL_COMMUNITY): Payer: Self-pay

## 2018-07-15 ENCOUNTER — Other Ambulatory Visit: Payer: Self-pay

## 2018-07-15 DIAGNOSIS — R1013 Epigastric pain: Secondary | ICD-10-CM | POA: Diagnosis not present

## 2018-07-15 DIAGNOSIS — R112 Nausea with vomiting, unspecified: Secondary | ICD-10-CM

## 2018-07-15 MED ORDER — ESOMEPRAZOLE MAGNESIUM 20 MG PO CPDR: 20.0000 mg | DELAYED_RELEASE_CAPSULE | Freq: Every day | ORAL | 0 refills | Status: DC

## 2018-07-15 MED ORDER — ONDANSETRON 4 MG PO TBDP: 4.0000 mg | ORAL_TABLET | Freq: Three times a day (TID) | ORAL | 0 refills | Status: DC | PRN

## 2018-07-15 MED ORDER — GI COCKTAIL ~~LOC~~
ORAL | Status: AC
  Filled 2018-07-15: qty 30

## 2018-07-15 MED ORDER — GI COCKTAIL ~~LOC~~
30.0000 mL | Freq: Once | ORAL | Status: AC
  Administered 2018-07-15: 30 mL via ORAL

## 2018-07-15 NOTE — ED Provider Notes (Signed)
[place]    CSN: [id number] Arrival date & time: 07/15/18  1009     History   Chief Complaint Chief Complaint  Patient presents with  . Abdominal Pain    HPI Rebecca Cuevas is a 57 y.o. female.   57 year old female comes in for acute onset of epigastric pain.  States went to bed without symptoms, but woke up with "soreness" to the epigastric region.  States has an ulcer in the past that felt the same way.  States the soreness is constant.  Had coffee this morning without difficulty.  However, later drink some water and had one episode of vomiting.  States had strips of "coffee grinds" then with bilious vomit.  Has not had vomiting since, but continues with nausea.  Had normal bowel movement this morning without difficulty.  Denies melena, hematochezia.  Denies weakness, dizziness, syncope.  Denies fever, chills, night sweats.  She normally drinks coffee before any food.  No alcohol use.  Current everyday smoker.  No drug use.  Has had an increase use of Aleve for the past few days.     Past Medical History:  Diagnosis Date  . Diabetes mellitus without complication ([place])   . Hypertension     Patient Active Problem List   Diagnosis Date Noted  . Callus of foot 02/04/2017  . Diabetes ([place]) 11/22/2016  . Thoracic back pain 11/22/2016  . Tachycardia 04/29/2014  . Arthritis 08/23/2011  . Obesity, unspecified 04/29/2009  . Essential hypertension 04/29/2009  . TOBACCO DEPENDENCE 02/09/2007  . Eczema 02/09/2007    Past Surgical History:  Procedure Laterality Date  . ABDOMINAL HYSTERECTOMY    . CESAREAN SECTION    . HERNIA REPAIR      OB History   None      Home Medications    Prior to Admission medications   Medication Sig Start Date End Date Taking? Authorizing Provider  hydrochlorothiazide (HYDRODIURIL) 25 MG tablet Take 1 tablet (25 mg total) by mouth daily. 05/18/18  Yes Donnamae Jude, MD  hydrocortisone cream 1 % APPLY ONE APPLICATION TOPICALLY  ONCE DAILY AS NEEDED FOR ITCHING (FOR ECZEMA). 05/19/16  Yes Donnamae Jude, MD  metFORMIN (GLUCOPHAGE) 500 MG tablet Take 1 tablet (500 mg total) by mouth 2 (two) times daily with a meal. 05/18/18  Yes Donnamae Jude, MD  alum & mag hydroxide-simeth (MAALOX/MYLANTA) 200-200-20 MG/5ML suspension Take by mouth every 6 (six) hours as needed for indigestion or heartburn.    [provider]  Blood Glucose Monitoring Suppl (BAYER CONTOUR NEXT MONITOR) w/Device KIT 1 Device by Does not apply route 2 (two) times daily. 02/24/17   Donnamae Jude, MD  esomeprazole (NEXIUM) 20 MG capsule Take 1 capsule (20 mg total) by mouth daily for 14 days. 07/15/18 07/29/18  Tasia Catchings, Amy V, PA-C  glucose blood (BAYER CONTOUR NEXT TEST) test strip Use as instructed 02/24/17   Donnamae Jude, MD  Lancet Devices (MICROLET NEXT LANCING DEVICE) MISC 1 Device by Does not apply route 2 (two) times daily. 02/24/17   Donnamae Jude, MD  Multiple Vitamins-Minerals (COMPLETE MULTIVITAMIN/MINERAL PO) Take by mouth.    [provider]  mupirocin ointment (BACTROBAN) 2 % Apply 1 application topically 2 (two) times daily. 01/16/18   Tasia Catchings, Amy V, PA-C  nicotine (NICODERM CQ) 14 mg/24hr patch Place 1 patch (14 mg total) onto the skin daily. 05/18/18   Donnamae Jude, MD  nystatin (MYCOSTATIN/NYSTOP) powder Apply topically 4 (four) times daily.  Patient not taking: Reported on 05/18/2018 11/23/17   Donnamae Jude, MD  ondansetron (ZOFRAN ODT) 4 MG disintegrating tablet Take 1 tablet (4 mg total) by mouth every 8 (eight) hours as needed for nausea or vomiting. 07/15/18   Tasia Catchings, Amy V, PA-C  vitamin B-12 (CYANOCOBALAMIN) 500 MCG tablet Take 500 mcg by mouth daily.    [provider]  vitamin C (ASCORBIC ACID) 500 MG tablet Take 500 mg by mouth daily.    [provider]    Family History Family History  Problem Relation Age of Onset  . Hypertension Mother   . Diabetes Father   . Colon cancer Neg Hx     Social History Social  History   Tobacco Use  . Smoking status: Current Every Day Smoker    Packs/day: 0.50  . Smokeless tobacco: Never Used  . Tobacco comment: 8 cigs a day  Substance Use Topics  . Alcohol use: Yes    Alcohol/week: 0.0 oz    Comment: occasional  . Drug use: No     Allergies   Patient has no known allergies.   Review of Systems Review of Systems  Reason unable to perform ROS: See HPI as above.     Physical Exam Triage Vital Signs ED Triage Vitals  Enc Vitals Group     BP 07/15/18 1036 135/78     Pulse Rate 07/15/18 1036 70     Resp 07/15/18 1036 17     Temp 07/15/18 1036 98.5 F (36.9 C)     Temp Source 07/15/18 1036 Oral     SpO2 07/15/18 1036 99 %     Weight --      Height --      Head Circumference --      Peak Flow --      Pain Score 07/15/18 1038 9     Pain Loc --      Pain Edu? --      Excl. in [place]? --    No data found.  Updated Vital Signs BP 135/78 (BP Location: Left Arm)   Pulse 70   Temp 98.5 F (36.9 C) (Oral)   Resp 17   SpO2 99%   Physical Exam  Constitutional: She is oriented to person, place, and time. She appears well-developed and well-nourished.  Non-toxic appearance. She does not appear ill. No distress.  HENT:  Head: Normocephalic and atraumatic.  Eyes: Pupils are equal, round, and reactive to light. Conjunctivae are normal.  Cardiovascular: Normal rate, regular rhythm and normal heart sounds. Exam reveals no gallop and no friction rub.  No murmur heard. Pulmonary/Chest: Effort normal and breath sounds normal. She has no wheezes. She has no rales.  Abdominal: Soft. Bowel sounds are normal. There is no rigidity and no CVA tenderness.  Mild epigastric tenderness to palpation without guarding or rebound.  Negative Murphy sign.  Neurological: She is alert and oriented to person, place, and time.  Skin: Skin is warm and dry.  Psychiatric: She has a normal mood and affect. Her behavior is normal. Judgment normal.     UC Treatments /  Results  Labs (all labs ordered are listed, but only abnormal results are displayed) Labs Reviewed - No data to display  EKG None  Radiology No results found.  Procedures Procedures (including critical care time)  Medications Ordered in UC Medications  gi cocktail (Maalox,Lidocaine,Donnatal) (30 mLs Oral Given 07/15/18 1119)    Initial Impression / Assessment and Plan / UC Course  I have reviewed the triage vital signs and the nursing notes.  Pertinent labs & imaging results that were available during my care of the patient were reviewed by me and considered in my medical decision making (see chart for details).    No alarming signs on exam.  Discussed possible gastritis caused by NSAIDs.  GI cocktail in office today.  Nexium as directed.  Zofran as needed.  Return precautions given.  Patient expresses understanding and agrees to plan.  Final Clinical Impressions(s) / UC Diagnoses   Final diagnoses:  Epigastric pain  Intractable vomiting with nausea, unspecified vomiting type    ED Prescriptions    Medication Sig Dispense Auth. Provider   esomeprazole (NEXIUM) 20 MG capsule Take 1 capsule (20 mg total) by mouth daily for 14 days. 14 capsule Yu, Amy V, PA-C   ondansetron (ZOFRAN ODT) 4 MG disintegrating tablet Take 1 tablet (4 mg total) by mouth every 8 (eight) hours as needed for nausea or vomiting. 10 tablet Tobin Chad, PA-C 07/15/18 1137

## 2018-07-15 NOTE — Discharge Instructions (Signed)
No alarming signs on exam. As discussed, stomach pain can be due to recent increased use of naproxen. As discussed coffee and cigarettes can cause acid reflux, which could also be causing current symptoms.  Nexium as directed for the next 7 to 14 days.  I have provided Zofran, you can take for nausea/vomiting.  Limit coffee and cigarettes.  Stop naproxen.  Follow-up with PCP for further evaluation if symptoms not improving.  If experiencing worsening symptoms, blood in stool, blood in vomit, weakness, dizziness, worsening abdominal pain, go to the emergency department for further evaluation.

## 2018-07-15 NOTE — ED Triage Notes (Signed)
Pt presents to [facility] for RUQ pain since last night, pt complains of nausea, cramping, and vomiting.

## 2018-08-08 ENCOUNTER — Emergency Department (HOSPITAL_COMMUNITY): Payer: BLUE CROSS/BLUE SHIELD

## 2018-08-08 ENCOUNTER — Other Ambulatory Visit: Payer: Self-pay

## 2018-08-08 ENCOUNTER — Inpatient Hospital Stay (HOSPITAL_COMMUNITY)
Admission: EM | Admit: 2018-08-08 | Discharge: 2018-08-11 | DRG: 247 | Disposition: A | Discharge status: Home / Self Care | Payer: BLUE CROSS/BLUE SHIELD | Attending: Internal Medicine | Admitting: Internal Medicine

## 2018-08-08 ENCOUNTER — Encounter (HOSPITAL_COMMUNITY): Payer: Self-pay

## 2018-08-08 DIAGNOSIS — Z955 Presence of coronary angioplasty implant and graft: Secondary | ICD-10-CM

## 2018-08-08 DIAGNOSIS — Z79899 Other long term (current) drug therapy: Secondary | ICD-10-CM

## 2018-08-08 DIAGNOSIS — T7840XA Allergy, unspecified, initial encounter: Secondary | ICD-10-CM

## 2018-08-08 DIAGNOSIS — K219 Gastro-esophageal reflux disease without esophagitis: Secondary | ICD-10-CM | POA: Diagnosis present

## 2018-08-08 DIAGNOSIS — I2510 Atherosclerotic heart disease of native coronary artery without angina pectoris: Secondary | ICD-10-CM | POA: Diagnosis present

## 2018-08-08 DIAGNOSIS — F17210 Nicotine dependence, cigarettes, uncomplicated: Secondary | ICD-10-CM | POA: Diagnosis present

## 2018-08-08 DIAGNOSIS — Z7984 Long term (current) use of oral hypoglycemic drugs: Secondary | ICD-10-CM

## 2018-08-08 DIAGNOSIS — I214 Non-ST elevation (NSTEMI) myocardial infarction: Secondary | ICD-10-CM | POA: Diagnosis not present

## 2018-08-08 DIAGNOSIS — Z91041 Radiographic dye allergy status: Secondary | ICD-10-CM

## 2018-08-08 DIAGNOSIS — Z90710 Acquired absence of both cervix and uterus: Secondary | ICD-10-CM

## 2018-08-08 DIAGNOSIS — I10 Essential (primary) hypertension: Secondary | ICD-10-CM | POA: Diagnosis present

## 2018-08-08 DIAGNOSIS — E785 Hyperlipidemia, unspecified: Secondary | ICD-10-CM | POA: Diagnosis present

## 2018-08-08 DIAGNOSIS — E119 Type 2 diabetes mellitus without complications: Secondary | ICD-10-CM | POA: Diagnosis present

## 2018-08-08 LAB — BASIC METABOLIC PANEL
Anion gap: 11 (ref 5–15)
BUN: 17 mg/dL (ref 6–20)
CALCIUM: 9.5 mg/dL (ref 8.9–10.3)
CO2: 25 mmol/L (ref 22–32)
CREATININE: 1.05 mg/dL — AB (ref 0.44–1.00)
Chloride: 103 mmol/L (ref 98–111)
GFR calc non Af Amer: 57 mL/min — ABNORMAL LOW (ref >60)
Glucose, Bld: 160 mg/dL — ABNORMAL HIGH (ref 70–99)
Potassium: 3.7 mmol/L (ref 3.5–5.1)
SODIUM: 139 mmol/L (ref 135–145)

## 2018-08-08 LAB — CBC
HCT: 41 % (ref 36.0–46.0)
Hemoglobin: 13.4 g/dL (ref 12.0–15.0)
MCH: 27.1 pg (ref 26.0–34.0)
MCHC: 32.7 g/dL (ref 30.0–36.0)
MCV: 83 fL (ref 78.0–100.0)
PLATELETS: 273 10*3/uL (ref 150–400)
RBC: 4.94 MIL/uL (ref 3.87–5.11)
RDW: 14.8 % (ref 11.5–15.5)
WBC: 16 10*3/uL — AB (ref 4.0–10.5)

## 2018-08-08 LAB — POCT I-STAT TROPONIN I
TROPONIN I, POC: 0.11 ng/mL — AB (ref 0.00–0.08)
TROPONIN I, POC: 0.33 ng/mL — AB (ref 0.00–0.08)

## 2018-08-08 MED ORDER — NITROGLYCERIN IN D5W 200-5 MCG/ML-% IV SOLN
0.0000 ug/min | INTRAVENOUS | Status: DC
  Administered 2018-08-09: 5 ug/min via INTRAVENOUS
  Administered 2018-08-09: 20 ug/min via INTRAVENOUS
  Filled 2018-08-08: qty 250

## 2018-08-08 MED ORDER — IOPAMIDOL (ISOVUE-370) INJECTION 76%
100.0000 mL | Freq: Once | INTRAVENOUS | Status: AC | PRN
  Administered 2018-08-08: 100 mL via INTRAVENOUS

## 2018-08-08 MED ORDER — DIPHENHYDRAMINE HCL 50 MG/ML IJ SOLN
INTRAMUSCULAR | Status: AC
  Filled 2018-08-08: qty 1

## 2018-08-08 MED ORDER — ASPIRIN 81 MG PO CHEW
324.0000 mg | CHEWABLE_TABLET | Freq: Once | ORAL | Status: AC
  Administered 2018-08-08: 324 mg via ORAL
  Filled 2018-08-08: qty 4

## 2018-08-08 MED ORDER — MORPHINE SULFATE (PF) 4 MG/ML IV SOLN
4.0000 mg | Freq: Once | INTRAVENOUS | Status: AC
  Administered 2018-08-08: 4 mg via INTRAVENOUS
  Filled 2018-08-08: qty 1

## 2018-08-08 MED ORDER — DIPHENHYDRAMINE HCL 50 MG/ML IJ SOLN
25.0000 mg | Freq: Once | INTRAMUSCULAR | Status: AC
  Administered 2018-08-08: 25 mg via INTRAVENOUS

## 2018-08-08 MED ORDER — NITROGLYCERIN 0.4 MG SL SUBL
0.4000 mg | SUBLINGUAL_TABLET | SUBLINGUAL | Status: DC | PRN
  Administered 2018-08-08: 0.4 mg via SUBLINGUAL
  Filled 2018-08-08: qty 1

## 2018-08-08 MED ORDER — IOPAMIDOL (ISOVUE-370) INJECTION 76%
INTRAVENOUS | Status: AC
  Administered 2018-08-08: 100 mL via INTRAVENOUS
  Filled 2018-08-08: qty 100

## 2018-08-08 MED ORDER — METHYLPREDNISOLONE SODIUM SUCC 125 MG IJ SOLR
INTRAMUSCULAR | Status: AC
  Filled 2018-08-08: qty 2

## 2018-08-08 MED ORDER — MORPHINE SULFATE (PF) 4 MG/ML IV SOLN
4.0000 mg | Freq: Once | INTRAVENOUS | Status: AC
  Administered 2018-08-09: 4 mg via INTRAVENOUS
  Filled 2018-08-08: qty 1

## 2018-08-08 MED ORDER — MORPHINE SULFATE (PF) 4 MG/ML IV SOLN
4.0000 mg | Freq: Once | INTRAVENOUS | Status: AC
Start: 2018-08-08 — End: 2018-08-08
  Administered 2018-08-08: 4 mg via INTRAVENOUS
  Filled 2018-08-08: qty 1

## 2018-08-08 MED ORDER — METHYLPREDNISOLONE SODIUM SUCC 125 MG IJ SOLR
125.0000 mg | Freq: Once | INTRAMUSCULAR | Status: AC
  Administered 2018-08-08: 125 mg via INTRAVENOUS

## 2018-08-08 MED ORDER — FAMOTIDINE IN NACL 20-0.9 MG/50ML-% IV SOLN
20.0000 mg | Freq: Once | INTRAVENOUS | Status: AC
  Administered 2018-08-08: 20 mg via INTRAVENOUS
  Filled 2018-08-08: qty 50

## 2018-08-08 NOTE — ED Notes (Signed)
Patient broke out in large hives on her face after CT scan with contrast injection. Pt became slightly more short of breath than she was already after scan as well. Dr. Melina Copa and RN Kerin Ransom Tai made aware. P was seen by Dr. Melina Copa in [place] room before taken back to ED room for monitoring. Pt needs pre-meds prior to future studies.

## 2018-08-08 NOTE — ED Triage Notes (Signed)
Pt complains of a tight sharp chest pain for two hours, she also states she's had some short of breath and starting to have neck pain, per pt her blood pressure is elevated, denies vomiting or diarrhea

## 2018-08-08 NOTE — ED Notes (Signed)
Critical Value Alert  I-STAT Troponin Result: 0.11  Notified EDP [place] face to face  [phone]

## 2018-08-08 NOTE — ED Notes (Signed)
ED provider Melina Copa made aware patient has critical troponin value of 0.11

## 2018-08-08 NOTE — ED Notes (Signed)
Informed by CT tech that pt developed hives after IV contrast was given.  Dr. Melina Copa at bedside and ordered medications.  Pt has noticeable hives on face.  Pt a/o x 4 and ABCD's intact. NAD noted. Will continue to monitor.

## 2018-08-08 NOTE — ED Notes (Signed)
MD notified of critical I-Stat troponin @ 2350.

## 2018-08-08 NOTE — ED Notes (Signed)
Pt declined to take more than 1 SL NTG.

## 2018-08-08 NOTE — ED Provider Notes (Signed)
[facility] DEPT Provider Note   CSN: [id number] Arrival date & time: 08/08/18  1912     History   Chief Complaint Chief Complaint  Patient presents with  . Chest Pain    HPI Rebecca Cuevas is a 57 y.o. female.  She has a history of diabetes and hypertension but no prior cardiac disease here with sharp substernal chest pain since 5 PM.  She tried some Zantac and some Maalox without any relief.  It is not associated with any nausea dizziness lightheadedness shortness of breath or diaphoresis.  She had a similar pain a few weeks ago went to urgent care.  That was when her primary care doctor ended up prescribing her some antacid medicine.  She rates this pain as 10 out of 10.  She does not take a daily aspirin.  The history is provided by the patient.  Chest Pain   This is a new problem. The current episode started 3 to 5 hours ago. The problem occurs constantly. The problem has not changed since onset.The pain is present in the substernal region. The pain is at a severity of 10/10. The quality of the pain is described as sharp. The pain does not radiate. Duration of episode(s) is 4 hours. Pertinent negatives include no abdominal pain, no back pain, no cough, no diaphoresis, no dizziness, no fever, no hemoptysis, no leg pain, no lower extremity edema, no nausea, no near-syncope, no palpitations, no shortness of breath, no sputum production, no syncope and no vomiting. She has tried nothing for the symptoms. The treatment provided no relief.  Her past medical history is significant for diabetes and hypertension.    Past Medical History:  Diagnosis Date  . Diabetes mellitus without complication ([place])   . Hypertension     Patient Active Problem List   Diagnosis Date Noted  . Callus of foot 02/04/2017  . Diabetes ([place]) 11/22/2016  . Thoracic back pain 11/22/2016  . Tachycardia 04/29/2014  . Arthritis 08/23/2011  . Obesity, unspecified 04/29/2009  .  Essential hypertension 04/29/2009  . TOBACCO DEPENDENCE 02/09/2007  . Eczema 02/09/2007    Past Surgical History:  Procedure Laterality Date  . ABDOMINAL HYSTERECTOMY    . CESAREAN SECTION    . HERNIA REPAIR       OB History   None      Home Medications    Prior to Admission medications   Medication Sig Start Date End Date Taking? Authorizing Provider  alum & mag hydroxide-simeth (MAALOX/MYLANTA) 200-200-20 MG/5ML suspension Take by mouth every 6 (six) hours as needed for indigestion or heartburn.    [provider]  Blood Glucose Monitoring Suppl (BAYER CONTOUR NEXT MONITOR) w/Device KIT 1 Device by Does not apply route 2 (two) times daily. 02/24/17   Donnamae Jude, MD  esomeprazole (NEXIUM) 20 MG capsule Take 1 capsule (20 mg total) by mouth daily for 14 days. 07/15/18 07/29/18  Tasia Catchings, Amy V, PA-C  glucose blood (BAYER CONTOUR NEXT TEST) test strip Use as instructed 02/24/17   Donnamae Jude, MD  hydrochlorothiazide (HYDRODIURIL) 25 MG tablet Take 1 tablet (25 mg total) by mouth daily. 05/18/18   Donnamae Jude, MD  hydrocortisone cream 1 % APPLY ONE APPLICATION TOPICALLY ONCE DAILY AS NEEDED FOR ITCHING (FOR ECZEMA). 05/19/16   Donnamae Jude, MD  Lancet Devices (MICROLET NEXT LANCING DEVICE) MISC 1 Device by Does not apply route 2 (two) times daily. 02/24/17   Donnamae Jude, MD  metFORMIN (GLUCOPHAGE)  500 MG tablet Take 1 tablet (500 mg total) by mouth 2 (two) times daily with a meal. 05/18/18   Donnamae Jude, MD  Multiple Vitamins-Minerals (COMPLETE MULTIVITAMIN/MINERAL PO) Take by mouth.    [provider]  mupirocin ointment (BACTROBAN) 2 % Apply 1 application topically 2 (two) times daily. 01/16/18   Tasia Catchings, Amy V, PA-C  nicotine (NICODERM CQ) 14 mg/24hr patch Place 1 patch (14 mg total) onto the skin daily. 05/18/18   Donnamae Jude, MD  nystatin (MYCOSTATIN/NYSTOP) powder Apply topically 4 (four) times daily. Patient not taking: Reported on 05/18/2018 11/23/17   Donnamae Jude, MD  ondansetron (ZOFRAN ODT) 4 MG disintegrating tablet Take 1 tablet (4 mg total) by mouth every 8 (eight) hours as needed for nausea or vomiting. 07/15/18   Tasia Catchings, Amy V, PA-C  vitamin B-12 (CYANOCOBALAMIN) 500 MCG tablet Take 500 mcg by mouth daily.    [provider]  vitamin C (ASCORBIC ACID) 500 MG tablet Take 500 mg by mouth daily.    [provider]    Family History Family History  Problem Relation Age of Onset  . Hypertension Mother   . Diabetes Father   . Colon cancer Neg Hx     Social History Social History   Tobacco Use  . Smoking status: Current Every Day Smoker    Packs/day: 0.50  . Smokeless tobacco: Never Used  . Tobacco comment: 8 cigs a day  Substance Use Topics  . Alcohol use: Yes    Alcohol/week: 0.0 standard drinks    Comment: occasional  . Drug use: No     Allergies   Patient has no known allergies.   Review of Systems Review of Systems  Constitutional: Negative for diaphoresis and fever.  HENT: Negative for sore throat.   Eyes: Negative for visual disturbance.  Respiratory: Negative for cough, hemoptysis, sputum production and shortness of breath.   Cardiovascular: Positive for chest pain. Negative for palpitations, syncope and near-syncope.  Gastrointestinal: Negative for abdominal pain, nausea and vomiting.  Genitourinary: Negative for dysuria.  Musculoskeletal: Negative for back pain.  Skin: Negative for rash.  Neurological: Negative for dizziness.     Physical Exam Updated Vital Signs BP (!) 158/97 (BP Location: Left Arm)   Pulse 86   Temp 98.2 F (36.8 C) (Oral)   Resp 15   Ht '5\' 6"'  (1.676 m)   Wt 91.6 kg   SpO2 100%   BMI 32.60 kg/m   Physical Exam  Constitutional: She appears well-developed and well-nourished. No distress.  HENT:  Head: Normocephalic and atraumatic.  Eyes: Conjunctivae are normal.  Neck: Normal range of motion. Neck supple.  Cardiovascular: Normal rate, regular rhythm and normal heart  sounds.  No murmur heard. Pulmonary/Chest: Effort normal and breath sounds normal. No respiratory distress.  Abdominal: Soft. There is no tenderness.  Musculoskeletal: Normal range of motion. She exhibits no edema, tenderness or deformity.       Right lower leg: She exhibits no tenderness and no edema.       Left lower leg: She exhibits no tenderness and no edema.  Neurological: She is alert.  Skin: Skin is warm and dry.  Psychiatric: She has a normal mood and affect.  Nursing note and vitals reviewed.    ED Treatments / Results  Labs (all labs ordered are listed, but only abnormal results are displayed) Labs Reviewed  BASIC METABOLIC PANEL - Abnormal; Notable for the following components:      Result Value  Glucose, Bld 160 (*)    Creatinine, Ser 1.05 (*)    GFR calc non Af Amer 57 (*)    All other components within normal limits  CBC - Abnormal; Notable for the following components:   WBC 16.0 (*)    All other components within normal limits  APTT - Abnormal; Notable for the following components:   aPTT 188 (*)    All other components within normal limits  BASIC METABOLIC PANEL - Abnormal; Notable for the following components:   CO2 20 (*)    Glucose, Bld 319 (*)    Creatinine, Ser 1.13 (*)    GFR calc non Af Amer 53 (*)    All other components within normal limits  TROPONIN I - Abnormal; Notable for the following components:   Troponin I 0.83 (*)    All other components within normal limits  GLUCOSE, CAPILLARY - Abnormal; Notable for the following components:   Glucose-Capillary 273 (*)    All other components within normal limits  CBC - Abnormal; Notable for the following components:   WBC 11.5 (*)    All other components within normal limits  HEMOGLOBIN A1C - Abnormal; Notable for the following components:   Hgb A1c MFr Bld 6.6 (*)    All other components within normal limits  LIPID PANEL - Abnormal; Notable for the following components:   Cholesterol 223 (*)      LDL Cholesterol 149 (*)    All other components within normal limits  GLUCOSE, CAPILLARY - Abnormal; Notable for the following components:   Glucose-Capillary 204 (*)    All other components within normal limits  POCT I-STAT TROPONIN I - Abnormal; Notable for the following components:   Troponin i, poc 0.11 (*)    All other components within normal limits  POCT I-STAT TROPONIN I - Abnormal; Notable for the following components:   Troponin i, poc 0.33 (*)    All other components within normal limits  MRSA PCR SCREENING  PROTIME-INR  HIV ANTIBODY (ROUTINE TESTING)  TROPONIN I  TROPONIN I  I-STAT TROPONIN, ED  I-STAT TROPONIN, ED    EKG EKG Interpretation  Date/Time:  Tuesday August 08 2018 20:53:32 EDT Ventricular Rate:  76 PR Interval:    QRS Duration: 85 QT Interval:  402 QTC Calculation: 452 R Axis:   52 Text Interpretation:  Sinus rhythm Consider left atrial enlargement No significant change since last tracing Confirmed by Dorie Rank [phone]) on 08/09/2018 12:22:45 PM   Radiology Dg Chest 2 View  Result Date: 08/08/2018 CLINICAL DATA:  Anterior midline chest pain EXAM: CHEST - 2 VIEW COMPARISON:  08/06/2013 FINDINGS: Heart and mediastinal contours are within normal limits. No focal opacities or effusions. No acute bony abnormality. IMPRESSION: No active cardiopulmonary disease. Electronically Signed   By: Rolm Baptise M.D.   On: 08/08/2018 20:03    Procedures .Critical Care Performed by: Hayden Rasmussen, MD Authorized by: Hayden Rasmussen, MD   Critical care provider statement:    Critical care time (minutes):  45   Critical care time was exclusive of:  Separately billable procedures and treating other patients   Critical care was necessary to treat or prevent imminent or life-threatening deterioration of the following conditions:  Cardiac failure   Critical care was time spent personally by me on the following activities:  Development of treatment plan with  patient or surrogate, discussions with consultants, evaluation of patient's response to treatment, examination of patient, obtaining history from patient or surrogate,  ordering and performing treatments and interventions, ordering and review of laboratory studies, ordering and review of radiographic studies, re-evaluation of patient's condition, pulse oximetry and review of old charts   I assumed direction of critical care for this patient from another provider in my specialty: no     (including critical care time)  Medications Ordered in ED Medications  nitroGLYCERIN 50 mg in dextrose 5 % 250 mL (0.2 mg/mL) infusion ( Intravenous MAR [place] 08/09/18 1334)  alum & mag hydroxide-simeth (MAALOX/MYLANTA) 200-200-20 MG/5ML suspension 30 mL ( Oral MAR Un[place] 08/09/18 1334)  aspirin EC tablet 81 mg ( Oral MAR [place] 08/09/18 1334)  nitroGLYCERIN (NITROSTAT) SL tablet 0.4 mg ( Sublingual MAR Un[place] 08/09/18 1334)  acetaminophen (TYLENOL) tablet 650 mg ( Oral MAR Unhold 08/09/18 1334)  ondansetron (ZOFRAN) injection 4 mg ( Intravenous MAR [place] 08/09/18 1334)  atorvastatin (LIPITOR) tablet 80 mg ( Oral MAR Unhold 08/09/18 1334)  insulin aspart (novoLOG) injection 0-15 Units ( Subcutaneous MAR Unhold 08/09/18 1334)  insulin aspart (novoLOG) injection 0-5 Units ( Subcutaneous MAR Unhold 08/09/18 1334)  carvedilol (COREG) tablet 6.25 mg (has no administration in time range)  enoxaparin (LOVENOX) injection 40 mg (has no administration in time range)  0.9% sodium chloride infusion (1 mL/kg/hr  90.1 kg Intravenous Rate/Dose Change 08/09/18 1156)  sodium chloride flush (NS) 0.9 % injection 3 mL (has no administration in time range)  sodium chloride flush (NS) 0.9 % injection 3 mL (has no administration in time range)  0.9 %  sodium chloride infusion (has no administration in time range)  ticagrelor (BRILINTA) tablet 90 mg (has no administration in time range)  tirofiban (AGGRASTAT) infusion 50 mcg/mL 250 mL (has no  administration in time range)  aspirin chewable tablet 324 mg (324 mg Oral Given 08/08/18 2100)  morphine 4 MG/ML injection 4 mg (4 mg Intravenous Given 08/08/18 2107)  morphine 4 MG/ML injection 4 mg (4 mg Intravenous Given 08/08/18 2313)  iopamidol (ISOVUE-370) 76 % injection 100 mL (100 mLs Intravenous Contrast Given 08/08/18 2157)  famotidine (PEPCID) IVPB 20 mg premix ( Intravenous Stopped 08/08/18 2249)  diphenhydrAMINE (BENADRYL) injection 25 mg (25 mg Intravenous Given 08/08/18 2209)  methylPREDNISolone sodium succinate (SOLU-MEDROL) 125 mg/2 mL injection 125 mg (125 mg Intravenous Given 08/08/18 2212)  morphine 4 MG/ML injection 4 mg (4 mg Intravenous Given 08/09/18 0008)  heparin injection 4,000 Units (4,000 Units Intravenous Given 08/09/18 0014)  methylPREDNISolone sodium succinate (SOLU-MEDROL) 125 mg/2 mL injection 125 mg (125 mg Intravenous Given 08/09/18 0946)  diphenhydrAMINE (BENADRYL) injection 12.5 mg (12.5 mg Intravenous Given 08/09/18 0949)  0.9 %  sodium chloride infusion (100 mL/hr Intravenous Rate/Dose Change 08/09/18 1047)  tirofiban (AGGRASTAT) infusion 50 mcg/mL 250 mL (0.15 mcg/kg/min  90.1 kg  Transfusing/Transfer 08/09/18 1200)  labetalol (NORMODYNE,TRANDATE) injection 10 mg (10 mg Intravenous Given 08/09/18 1302)     Initial Impression / Assessment and Plan / ED Course  I have reviewed the triage vital signs and the nursing notes.  Pertinent labs & imaging results that were available during my care of the patient were reviewed by me and considered in my medical decision making (see chart for details).  Clinical Course as of Aug 09 1516  Tue Aug 08, 2018  2126 EKG from 1918 is normal sinus rhythm rate of 67 normal intervals borderline ST elevation V1 V2.   [MB]  2126 Repeat EKG from 2053 normal sinus rhythm essentially unchanged from prior EKG.   [MB]  2127 EKGs are similar to 8/14   [  MB]  2142 Reevaluated patient, pain is unchanged 10 out of 10.  Vitals remain stable  and she looks comfortable otherwise.  We have ordered her some nitro sublingual and another dose of morphine.   [MB]  2208 Just got a call from CT after patient got her IV contrast load she got acute hives over her face.  Went to evaluate her her lungs are clear she is awake and alert.  She does have hives over her face.  We have ordered her Solu-Medrol Pepcid Benadryl and she is back in the room on the monitor.  Sats remain 100% on room air.   [MB]  Wed Aug 09, 2018  0028 discussed with Dr. Elson Areas from cardiology who agrees with current management of heparin and nitro.  He reviewed the EKGs and feels this is consistent with an end STEMI.  He agrees to admit the patient over to [facility] on his service under telemetry.   [MB]    Clinical Course User Index [MB] Hayden Rasmussen, MD      Final Clinical Impressions(s) / ED Diagnoses   Final diagnoses:  NSTEMI (non-ST elevated myocardial infarction) [facility])    ED Discharge Orders    None       Hayden Rasmussen, MD 08/09/18 [phone]

## 2018-08-09 ENCOUNTER — Other Ambulatory Visit (HOSPITAL_COMMUNITY): Payer: BLUE CROSS/BLUE SHIELD

## 2018-08-09 ENCOUNTER — Encounter (HOSPITAL_COMMUNITY)
Admission: EM | Disposition: A | Discharge status: Home / Self Care | Payer: Self-pay | Source: Home / Self Care | Attending: Internal Medicine

## 2018-08-09 ENCOUNTER — Encounter (HOSPITAL_COMMUNITY): Payer: Self-pay | Admitting: Cardiovascular Disease

## 2018-08-09 ENCOUNTER — Inpatient Hospital Stay (HOSPITAL_COMMUNITY): Payer: BLUE CROSS/BLUE SHIELD

## 2018-08-09 DIAGNOSIS — E118 Type 2 diabetes mellitus with unspecified complications: Secondary | ICD-10-CM | POA: Diagnosis not present

## 2018-08-09 DIAGNOSIS — I5030 Unspecified diastolic (congestive) heart failure: Secondary | ICD-10-CM | POA: Diagnosis not present

## 2018-08-09 DIAGNOSIS — E785 Hyperlipidemia, unspecified: Secondary | ICD-10-CM | POA: Diagnosis present

## 2018-08-09 DIAGNOSIS — E119 Type 2 diabetes mellitus without complications: Secondary | ICD-10-CM | POA: Diagnosis present

## 2018-08-09 DIAGNOSIS — K219 Gastro-esophageal reflux disease without esophagitis: Secondary | ICD-10-CM | POA: Diagnosis present

## 2018-08-09 DIAGNOSIS — Z9861 Coronary angioplasty status: Secondary | ICD-10-CM | POA: Insufficient documentation

## 2018-08-09 DIAGNOSIS — I214 Non-ST elevation (NSTEMI) myocardial infarction: Secondary | ICD-10-CM | POA: Diagnosis present

## 2018-08-09 DIAGNOSIS — Z7984 Long term (current) use of oral hypoglycemic drugs: Secondary | ICD-10-CM | POA: Diagnosis not present

## 2018-08-09 DIAGNOSIS — Z91041 Radiographic dye allergy status: Secondary | ICD-10-CM | POA: Diagnosis not present

## 2018-08-09 DIAGNOSIS — I10 Essential (primary) hypertension: Secondary | ICD-10-CM | POA: Diagnosis present

## 2018-08-09 DIAGNOSIS — I2510 Atherosclerotic heart disease of native coronary artery without angina pectoris: Secondary | ICD-10-CM

## 2018-08-09 DIAGNOSIS — I252 Old myocardial infarction: Secondary | ICD-10-CM

## 2018-08-09 DIAGNOSIS — E1169 Type 2 diabetes mellitus with other specified complication: Secondary | ICD-10-CM

## 2018-08-09 DIAGNOSIS — Z955 Presence of coronary angioplasty implant and graft: Secondary | ICD-10-CM | POA: Diagnosis not present

## 2018-08-09 DIAGNOSIS — Z90710 Acquired absence of both cervix and uterus: Secondary | ICD-10-CM | POA: Diagnosis not present

## 2018-08-09 DIAGNOSIS — F17210 Nicotine dependence, cigarettes, uncomplicated: Secondary | ICD-10-CM | POA: Diagnosis present

## 2018-08-09 DIAGNOSIS — Z79899 Other long term (current) drug therapy: Secondary | ICD-10-CM | POA: Diagnosis not present

## 2018-08-09 DIAGNOSIS — I25110 Atherosclerotic heart disease of native coronary artery with unstable angina pectoris: Secondary | ICD-10-CM | POA: Diagnosis not present

## 2018-08-09 HISTORY — PX: LEFT HEART CATH AND CORONARY ANGIOGRAPHY: CATH118249

## 2018-08-09 HISTORY — PX: CORONARY STENT INTERVENTION: CATH118234

## 2018-08-09 HISTORY — DX: Atherosclerotic heart disease of native coronary artery without angina pectoris: I25.10

## 2018-08-09 HISTORY — DX: Old myocardial infarction: I25.2

## 2018-08-09 LAB — GLUCOSE, CAPILLARY
GLUCOSE-CAPILLARY: 219 mg/dL — AB (ref 70–99)
Glucose-Capillary: 273 mg/dL — ABNORMAL HIGH (ref 70–99)
Glucose-Capillary: 204 mg/dL — ABNORMAL HIGH (ref 70–99)
Glucose-Capillary: 292 mg/dL — ABNORMAL HIGH (ref 70–99)

## 2018-08-09 LAB — CBC
HCT: 38.2 % (ref 36.0–46.0)
Hemoglobin: 12.4 g/dL (ref 12.0–15.0)
MCH: 26.3 pg (ref 26.0–34.0)
MCHC: 32.5 g/dL (ref 30.0–36.0)
MCV: 81.1 fL (ref 78.0–100.0)
PLATELETS: 255 10*3/uL (ref 150–400)
RBC: 4.71 MIL/uL (ref 3.87–5.11)
RDW: 14.4 % (ref 11.5–15.5)
WBC: 11.5 10*3/uL — ABNORMAL HIGH (ref 4.0–10.5)

## 2018-08-09 LAB — BASIC METABOLIC PANEL
Anion gap: 15 (ref 5–15)
BUN: 13 mg/dL (ref 6–20)
CHLORIDE: 101 mmol/L (ref 98–111)
CO2: 20 mmol/L — ABNORMAL LOW (ref 22–32)
Calcium: 9.4 mg/dL (ref 8.9–10.3)
Creatinine, Ser: 1.13 mg/dL — ABNORMAL HIGH (ref 0.44–1.00)
GFR calc Af Amer: >60 mL/min (ref >60)
GFR calc non Af Amer: 53 mL/min — ABNORMAL LOW (ref >60)
GLUCOSE: 319 mg/dL — AB (ref 70–99)
POTASSIUM: 3.9 mmol/L (ref 3.5–5.1)
SODIUM: 136 mmol/L (ref 135–145)

## 2018-08-09 LAB — LIPID PANEL
Cholesterol: 223 mg/dL — ABNORMAL HIGH (ref 0–200)
HDL: 65 mg/dL (ref >40)
LDL Cholesterol: 149 mg/dL — ABNORMAL HIGH (ref 0–99)
TRIGLYCERIDES: 44 mg/dL (ref <150)
Total CHOL/HDL Ratio: 3.4 RATIO
VLDL: 9 mg/dL (ref 0–40)

## 2018-08-09 LAB — APTT: APTT: 188 s — AB (ref 24–36)

## 2018-08-09 LAB — PROTIME-INR
INR: 1.13
Prothrombin Time: 14.4 seconds (ref 11.4–15.2)

## 2018-08-09 LAB — TROPONIN I
TROPONIN I: 0.83 ng/mL — AB (ref <0.03)
TROPONIN I: 13.19 ng/mL — AB (ref <0.03)
Troponin I: 15.14 ng/mL (ref <0.03)

## 2018-08-09 LAB — HEMOGLOBIN A1C
HEMOGLOBIN A1C: 6.6 % — AB (ref 4.8–5.6)
Mean Plasma Glucose: 142.72 mg/dL

## 2018-08-09 LAB — ECHOCARDIOGRAM COMPLETE
HEIGHTINCHES: 66 in
Weight: 3179.2 oz

## 2018-08-09 LAB — MRSA PCR SCREENING: MRSA by PCR: NEGATIVE

## 2018-08-09 LAB — HIV ANTIBODY (ROUTINE TESTING W REFLEX): HIV Screen 4th Generation wRfx: NONREACTIVE

## 2018-08-09 SURGERY — LEFT HEART CATH AND CORONARY ANGIOGRAPHY
Anesthesia: LOCAL

## 2018-08-09 MED ORDER — HEPARIN (PORCINE) IN NACL 100-0.45 UNIT/ML-% IJ SOLN
1100.0000 [IU]/h | INTRAMUSCULAR | Status: DC
  Administered 2018-08-09: 1100 [IU]/h via INTRAVENOUS
  Filled 2018-08-09: qty 250

## 2018-08-09 MED ORDER — ALUM & MAG HYDROXIDE-SIMETH 200-200-20 MG/5ML PO SUSP: 30.0000 mL | Freq: Four times a day (QID) | ORAL | Status: DC | PRN

## 2018-08-09 MED ORDER — TIROFIBAN HCL IV 12.5 MG/250 ML
INTRAVENOUS | Status: AC | PRN
  Administered 2018-08-09: 0.15 ug/kg/min via INTRAVENOUS

## 2018-08-09 MED ORDER — IOHEXOL 350 MG/ML SOLN
INTRAVENOUS | Status: DC | PRN
  Administered 2018-08-09: 170 mL via INTRA_ARTERIAL

## 2018-08-09 MED ORDER — NITROGLYCERIN 1 MG/10 ML FOR IR/CATH LAB
INTRA_ARTERIAL | Status: DC | PRN
  Administered 2018-08-09 (×3): 200 ug via INTRACORONARY

## 2018-08-09 MED ORDER — ADENOSINE 6 MG/2ML IV SOLN
INTRAVENOUS | Status: AC
  Filled 2018-08-09: qty 2

## 2018-08-09 MED ORDER — NITROGLYCERIN 0.4 MG SL SUBL: 0.4000 mg | SUBLINGUAL_TABLET | SUBLINGUAL | Status: DC | PRN

## 2018-08-09 MED ORDER — FENTANYL CITRATE (PF) 100 MCG/2ML IJ SOLN
INTRAMUSCULAR | Status: AC
  Filled 2018-08-09: qty 2

## 2018-08-09 MED ORDER — LABETALOL HCL 5 MG/ML IV SOLN
INTRAVENOUS | Status: AC
  Filled 2018-08-09: qty 4

## 2018-08-09 MED ORDER — HEPARIN SODIUM (PORCINE) 5000 UNIT/ML IJ SOLN
4000.0000 [IU] | Freq: Once | INTRAMUSCULAR | Status: AC
  Administered 2018-08-09: 4000 [IU] via INTRAVENOUS
  Filled 2018-08-09: qty 0.8

## 2018-08-09 MED ORDER — METHYLPREDNISOLONE SODIUM SUCC 125 MG IJ SOLR
125.0000 mg | Freq: Once | INTRAMUSCULAR | Status: AC
  Administered 2018-08-09: 125 mg via INTRAVENOUS
  Filled 2018-08-09: qty 2

## 2018-08-09 MED ORDER — INSULIN ASPART 100 UNIT/ML ~~LOC~~ SOLN
0.0000 [IU] | Freq: Three times a day (TID) | SUBCUTANEOUS | Status: DC
  Administered 2018-08-09 (×2): 8 [IU] via SUBCUTANEOUS
  Administered 2018-08-10 – 2018-08-11 (×3): 2 [IU] via SUBCUTANEOUS

## 2018-08-09 MED ORDER — TIROFIBAN HCL IV 12.5 MG/250 ML
INTRAVENOUS | Status: AC
  Filled 2018-08-09: qty 250

## 2018-08-09 MED ORDER — FENTANYL CITRATE (PF) 100 MCG/2ML IJ SOLN
INTRAMUSCULAR | Status: DC | PRN
  Administered 2018-08-09: 25 ug via INTRAVENOUS
  Administered 2018-08-09: 50 ug via INTRAVENOUS

## 2018-08-09 MED ORDER — ENOXAPARIN SODIUM 40 MG/0.4ML ~~LOC~~ SOLN
40.0000 mg | SUBCUTANEOUS | Status: DC
  Administered 2018-08-10 – 2018-08-11 (×2): 40 mg via SUBCUTANEOUS
  Filled 2018-08-09 (×2): qty 0.4

## 2018-08-09 MED ORDER — SODIUM CHLORIDE 0.9 % IV SOLN
INTRAVENOUS | Status: AC | PRN
  Administered 2018-08-09: 10 mL/h via INTRAVENOUS

## 2018-08-09 MED ORDER — HEPARIN (PORCINE) IN NACL 1000-0.9 UT/500ML-% IV SOLN
INTRAVENOUS | Status: DC | PRN
  Administered 2018-08-09 (×2): 500 mL

## 2018-08-09 MED ORDER — SODIUM CHLORIDE 0.9 % WEIGHT BASED INFUSION: 1.0000 mL/kg/h | INTRAVENOUS | Status: DC

## 2018-08-09 MED ORDER — LIDOCAINE HCL (PF) 1 % IJ SOLN
INTRAMUSCULAR | Status: AC
  Filled 2018-08-09: qty 30

## 2018-08-09 MED ORDER — LIDOCAINE HCL (PF) 1 % IJ SOLN
INTRAMUSCULAR | Status: DC | PRN
  Administered 2018-08-09: 2 mL via SUBCUTANEOUS

## 2018-08-09 MED ORDER — SODIUM CHLORIDE 0.9% FLUSH
3.0000 mL | Freq: Two times a day (BID) | INTRAVENOUS | Status: DC
  Administered 2018-08-10 – 2018-08-11 (×3): 3 mL via INTRAVENOUS

## 2018-08-09 MED ORDER — DIPHENHYDRAMINE HCL 25 MG PO CAPS
25.0000 mg | ORAL_CAPSULE | Freq: Once | ORAL | Status: DC
  Administered 2018-08-09: 25 mg via ORAL
  Filled 2018-08-09: qty 1

## 2018-08-09 MED ORDER — DIPHENHYDRAMINE HCL 50 MG/ML IJ SOLN
12.5000 mg | Freq: Once | INTRAMUSCULAR | Status: AC
  Administered 2018-08-09: 12.5 mg via INTRAVENOUS
  Filled 2018-08-09: qty 1

## 2018-08-09 MED ORDER — TIROFIBAN HCL IV 12.5 MG/250 ML
0.1500 ug/kg/min | INTRAVENOUS | Status: AC
  Administered 2018-08-09: 0.15 ug/kg/min via INTRAVENOUS
  Filled 2018-08-09: qty 100
  Filled 2018-08-09: qty 250

## 2018-08-09 MED ORDER — METOPROLOL TARTRATE 12.5 MG HALF TABLET
12.5000 mg | ORAL_TABLET | Freq: Two times a day (BID) | ORAL | Status: DC
  Filled 2018-08-09: qty 1

## 2018-08-09 MED ORDER — ADENOSINE (DIAGNOSTIC) FOR INTRACORONARY USE
INTRAVENOUS | Status: DC | PRN
  Administered 2018-08-09 (×4): 120 ug via INTRACORONARY

## 2018-08-09 MED ORDER — TICAGRELOR 90 MG PO TABS
90.0000 mg | ORAL_TABLET | Freq: Two times a day (BID) | ORAL | Status: DC
  Administered 2018-08-09 – 2018-08-11 (×4): 90 mg via ORAL
  Filled 2018-08-09 (×4): qty 1

## 2018-08-09 MED ORDER — CARVEDILOL 3.125 MG PO TABS
3.1250 mg | ORAL_TABLET | Freq: Two times a day (BID) | ORAL | Status: DC
  Administered 2018-08-09: 3.125 mg via ORAL
  Filled 2018-08-09: qty 1

## 2018-08-09 MED ORDER — SODIUM CHLORIDE 0.9% FLUSH: 3.0000 mL | INTRAVENOUS | Status: DC | PRN

## 2018-08-09 MED ORDER — ATORVASTATIN CALCIUM 40 MG PO TABS: 40.0000 mg | ORAL_TABLET | Freq: Every day | ORAL | Status: DC

## 2018-08-09 MED ORDER — SODIUM CHLORIDE 0.9 % IV SOLN: 250.0000 mL | INTRAVENOUS | Status: DC | PRN

## 2018-08-09 MED ORDER — HEPARIN (PORCINE) IN NACL 1000-0.9 UT/500ML-% IV SOLN
INTRAVENOUS | Status: AC
  Filled 2018-08-09: qty 1000

## 2018-08-09 MED ORDER — ASPIRIN EC 81 MG PO TBEC
81.0000 mg | DELAYED_RELEASE_TABLET | Freq: Every day | ORAL | Status: DC
  Administered 2018-08-09 – 2018-08-11 (×3): 81 mg via ORAL
  Filled 2018-08-09 (×3): qty 1

## 2018-08-09 MED ORDER — TICAGRELOR 90 MG PO TABS
ORAL_TABLET | ORAL | Status: DC | PRN
  Administered 2018-08-09: 180 mg via ORAL

## 2018-08-09 MED ORDER — HEPARIN SODIUM (PORCINE) 1000 UNIT/ML IJ SOLN
INTRAMUSCULAR | Status: DC | PRN
  Administered 2018-08-09 (×2): 5000 [IU] via INTRAVENOUS

## 2018-08-09 MED ORDER — ONDANSETRON HCL 4 MG/2ML IJ SOLN: 4.0000 mg | Freq: Four times a day (QID) | INTRAMUSCULAR | Status: DC | PRN

## 2018-08-09 MED ORDER — LABETALOL HCL 5 MG/ML IV SOLN
10.0000 mg | Freq: Once | INTRAVENOUS | Status: AC
  Administered 2018-08-09: 10 mg via INTRAVENOUS

## 2018-08-09 MED ORDER — INSULIN ASPART 100 UNIT/ML ~~LOC~~ SOLN
0.0000 [IU] | Freq: Every day | SUBCUTANEOUS | Status: DC
  Administered 2018-08-09: 2 [IU] via SUBCUTANEOUS

## 2018-08-09 MED ORDER — ACETAMINOPHEN 325 MG PO TABS
ORAL_TABLET | ORAL | Status: AC
  Filled 2018-08-09: qty 2

## 2018-08-09 MED ORDER — MIDAZOLAM HCL 2 MG/2ML IJ SOLN
INTRAMUSCULAR | Status: AC
  Filled 2018-08-09: qty 2

## 2018-08-09 MED ORDER — VERAPAMIL HCL 2.5 MG/ML IV SOLN
INTRAVENOUS | Status: DC | PRN
  Administered 2018-08-09: 10 mL via INTRA_ARTERIAL

## 2018-08-09 MED ORDER — TICAGRELOR 90 MG PO TABS
ORAL_TABLET | ORAL | Status: AC
  Filled 2018-08-09: qty 2

## 2018-08-09 MED ORDER — SODIUM CHLORIDE 0.9 % WEIGHT BASED INFUSION: 3.0000 mL/kg/h | INTRAVENOUS | Status: AC

## 2018-08-09 MED ORDER — ATORVASTATIN CALCIUM 80 MG PO TABS
80.0000 mg | ORAL_TABLET | Freq: Every day | ORAL | Status: DC
  Administered 2018-08-09 – 2018-08-10 (×2): 80 mg via ORAL
  Filled 2018-08-09 (×2): qty 1

## 2018-08-09 MED ORDER — VERAPAMIL HCL 2.5 MG/ML IV SOLN
INTRAVENOUS | Status: AC
  Filled 2018-08-09: qty 2

## 2018-08-09 MED ORDER — ACETAMINOPHEN 325 MG PO TABS
650.0000 mg | ORAL_TABLET | ORAL | Status: DC | PRN
  Administered 2018-08-09 – 2018-08-11 (×4): 650 mg via ORAL
  Filled 2018-08-09 (×3): qty 2

## 2018-08-09 MED ORDER — SODIUM CHLORIDE 0.9 % WEIGHT BASED INFUSION: 1.0000 mL/kg/h | INTRAVENOUS | Status: AC

## 2018-08-09 MED ORDER — NITROGLYCERIN 1 MG/10 ML FOR IR/CATH LAB
INTRA_ARTERIAL | Status: AC
  Filled 2018-08-09: qty 10

## 2018-08-09 MED ORDER — CARVEDILOL 6.25 MG PO TABS
6.2500 mg | ORAL_TABLET | Freq: Two times a day (BID) | ORAL | Status: DC
  Administered 2018-08-09 – 2018-08-11 (×4): 6.25 mg via ORAL
  Filled 2018-08-09 (×4): qty 1

## 2018-08-09 MED ORDER — MIDAZOLAM HCL 2 MG/2ML IJ SOLN
INTRAMUSCULAR | Status: DC | PRN
  Administered 2018-08-09: 2 mg via INTRAVENOUS

## 2018-08-09 MED ORDER — TIROFIBAN (AGGRASTAT) BOLUS VIA INFUSION
INTRAVENOUS | Status: DC | PRN
  Administered 2018-08-09: 2252.5 ug via INTRAVENOUS

## 2018-08-09 MED ORDER — HEPARIN SODIUM (PORCINE) 1000 UNIT/ML IJ SOLN
INTRAMUSCULAR | Status: AC
  Filled 2018-08-09: qty 1

## 2018-08-09 SURGICAL SUPPLY — 23 items
BALLN EMERGE MR 2.5X12 (BALLOONS) ×2
BALLN ~~LOC~~ EMERGE MR 4.0X15 (BALLOONS) ×2
BALLOON EMERGE MR 2.5X12 (BALLOONS) IMPLANT
BALLOON ~~LOC~~ EMERGE MR 4.0X15 (BALLOONS) IMPLANT
CATH EXTRAC PRONTO 5.5F 138CM (CATHETERS) ×1 IMPLANT
CATH INFINITI 5 FR JL3.5 (CATHETERS) ×1 IMPLANT
CATH INFINITI 5FR JK (CATHETERS) ×1 IMPLANT
CATH VISTA GUIDE 6FR JR4 (CATHETERS) ×1 IMPLANT
DEVICE RAD COMP TR BAND LRG (VASCULAR PRODUCTS) ×1 IMPLANT
ELECT DEFIB PAD ADLT CADENCE (PAD) ×1 IMPLANT
GLIDESHEATH SLEND SS 6F .021 (SHEATH) ×1 IMPLANT
GUIDEWIRE INQWIRE 1.5J.035X260 (WIRE) IMPLANT
INQWIRE 1.5J .035X260CM (WIRE) ×2
KIT ENCORE 26 ADVANTAGE (KITS) ×1 IMPLANT
KIT HEART LEFT (KITS) ×2 IMPLANT
PACK CARDIAC CATHETERIZATION (CUSTOM PROCEDURE TRAY) ×2 IMPLANT
PROTECTION STATION PRESSURIZED (MISCELLANEOUS) ×2
STATION PROTECTION PRESSURIZED (MISCELLANEOUS) IMPLANT
STENT SYNERGY DES 3.5X20 (Permanent Stent) ×1 IMPLANT
SYR MEDRAD MARK V 150ML (SYRINGE) ×1 IMPLANT
TRANSDUCER W/STOPCOCK (MISCELLANEOUS) ×2 IMPLANT
TUBING CIL FLEX 10 FLL-RA (TUBING) ×2 IMPLANT
WIRE RUNTHROUGH .014X180CM (WIRE) ×1 IMPLANT

## 2018-08-09 NOTE — ED Notes (Signed)
Spoke with Rebecca Cuevas about pt's abnormal aPTT.  APTT collected after heparin bolus was given and JC said it was safe to continue heparin at current rate.

## 2018-08-09 NOTE — Progress Notes (Signed)
TR Band removed, 2x2 and tegaderm applied, no bleeding noted. Continue to monitor.

## 2018-08-09 NOTE — Plan of Care (Signed)
  Problem: Education: Goal: Knowledge of General Education information will improve Description Including pain rating scale, medication(s)/side effects and non-pharmacologic comfort measures Outcome: Completed/Met   Problem: Clinical Measurements: Goal: Respiratory complications will improve Outcome: Completed/Met

## 2018-08-09 NOTE — Progress Notes (Addendum)
Progress Note  Patient Name: Rebecca Cuevas Date of Encounter: 08/09/2018  Primary Cardiologist: No primary care provider on file.  Subjective   Still having chest pain this morning, despite nitro gtt. Rates 9/10.   Inpatient Medications    Scheduled Meds: . aspirin EC  81 mg Oral Daily  . atorvastatin  80 mg Oral q1800  . insulin aspart  0-15 Units Subcutaneous TID WC  . insulin aspart  0-5 Units Subcutaneous QHS  . metoprolol tartrate  12.5 mg Oral BID   Continuous Infusions: . heparin 1,100 Units/hr (08/09/18 0458)  . nitroGLYCERIN 20 mcg/min (08/09/18 0458)   PRN Meds: acetaminophen, alum & mag hydroxide-simeth, nitroGLYCERIN, ondansetron (ZOFRAN) IV   Vital Signs    Vitals:   08/09/18 0200 08/09/18 0230 08/09/18 0331 08/09/18 0347  BP: (!) 175/93 (!) 143/93  (!) 160/94  Pulse: 92 93 71 92  Resp: 20 18 18    Temp:   98.2 F (36.8 C)   TempSrc:   Oral   SpO2: 97% 97% 100%   Weight:   90.1 kg   Height:   5\' 6"  (1.676 m)     Intake/Output Summary (Last 24 hours) at 08/09/2018 0855 Last data filed at 08/09/2018 0458 Gross per 24 hour  Intake 118.98 ml  Output -  Net 118.98 ml   Filed Weights   08/08/18 1919 08/09/18 0331  Weight: 91.6 kg 90.1 kg    Telemetry    SR - Personally Reviewed  ECG    SR (08/08/18) - Personally Reviewed  Physical Exam   General: Well developed, well nourished, female appearing in no acute distress. Head: Normocephalic, atraumatic.  Neck: Supple without bruits, JVD. Lungs:  Resp regular and unlabored, CTA. Heart: RRR, S1, S2, no murmur; no rub. Abdomen: Soft, non-tender, non-distended with normoactive bowel sounds. No hepatomegaly. No rebound/guarding. No obvious abdominal masses. Extremities: No clubbing, cyanosis, edema. Distal pedal pulses are 2+ bilaterally. Neuro: Alert and oriented X 3. Moves all extremities spontaneously. Psych: Normal affect.  Labs    Chemistry Recent Labs  Lab 08/08/18 2026  08/09/18 0448  NA 139 136  K 3.7 3.9  CL 103 101  CO2 25 20*  GLUCOSE 160* 319*  BUN 17 13  CREATININE 1.05* 1.13*  CALCIUM 9.5 9.4  GFRNONAA 57* 53*  GFRAA >60 >60  ANIONGAP 11 15     Hematology Recent Labs  Lab 08/08/18 2026  WBC 16.0*  RBC 4.94  HGB 13.4  HCT 41.0  MCV 83.0  MCH 27.1  MCHC 32.7  RDW 14.8  PLT 273    Cardiac Enzymes Recent Labs  Lab 08/09/18 0448  TROPONINI 0.83*    Recent Labs  Lab 08/08/18 2030 08/08/18 2311  TROPIPOC 0.11* 0.33*     BNPNo results for input(s): BNP, PROBNP in the last 168 hours.   DDimer No results for input(s): DDIMER in the last 168 hours.    Radiology    Dg Chest 2 View  Result Date: 08/08/2018 CLINICAL DATA:  Anterior midline chest pain EXAM: CHEST - 2 VIEW COMPARISON:  08/06/2013 FINDINGS: Heart and mediastinal contours are within normal limits. No focal opacities or effusions. No acute bony abnormality. IMPRESSION: No active cardiopulmonary disease. Electronically Signed   By: Rolm Baptise M.D.   On: 08/08/2018 20:03   Ct Angio Chest Pe W/cm &/or Wo Cm  Result Date: 08/08/2018 CLINICAL DATA:  Chest pain and shortness of breath EXAM: CT ANGIOGRAPHY CHEST WITH CONTRAST TECHNIQUE: Multidetector CT imaging of the  chest was performed using the standard protocol during bolus administration of intravenous contrast. Multiplanar CT image reconstructions and MIPs were obtained to evaluate the vascular anatomy. CONTRAST:  113mL ISOVUE-370 IOPAMIDOL (ISOVUE-370) INJECTION 76% COMPARISON:  None. FINDINGS: Cardiovascular: --Pulmonary arteries: Contrast injection is sufficient to demonstrate satisfactory opacification of the pulmonary arteries to the segmental level. There is no pulmonary embolus. The main pulmonary artery is within normal limits for size. --Aorta: Satisfactory opacification of the thoracic aorta. No aortic dissection or other acute aortic syndrome. Conventional 3 vessel aortic branching pattern. The aortic course  and caliber are normal. There is no aortic atherosclerosis. --Heart: Normal size. No pericardial effusion. Mediastinum/Nodes: No mediastinal, hilar or axillary lymphadenopathy. The visualized thyroid and thoracic esophageal course are unremarkable. Lungs/Pleura: No pulmonary nodules or masses. No pleural effusion or pneumothorax. No focal airspace consolidation. No focal pleural abnormality. Upper Abdomen: Contrast bolus timing is not optimized for evaluation of the abdominal organs. Within this limitation, the visualized organs of the upper abdomen are normal. Musculoskeletal: No chest wall abnormality. No acute or significant osseous findings. Review of the MIP images confirms the above findings. IMPRESSION: No pulmonary embolus or acute aortic syndrome. Electronically Signed   By: Ulyses Jarred M.D.   On: 08/08/2018 22:28    Cardiac Studies   N/a   Patient Profile     57 y.o. female with PMH of tobacco use, HTN, HL and DM presented with chest pain, thought it GERD. Found to have a positive troponin, and transferred to [facility] for further work up.   Assessment & Plan    1. NSTEMI: trop up to 0.83 this morning. Remains on IV heparin and nitro gtt. Still with active chest pain this morning. Does have concerning RFs, does not know her family hx. Will plan for cardiac cath today. Plan to take soon given her active pain. Of note, she reports seeing Dr. Wynonia Lawman as an outpatient about 2 weeks ago and planned for possible outpatient stress test.  -- The patient understands that risks included but are not limited to stroke (1 in 1000), death (1 in 1000), kidney failure [usually temporary] (1 in 500), bleeding (1 in 200), allergic reaction [possibly serious] (1 in 200).  -- Will give premeds now for contrast allergy.  -- ASA, statin, BB -- echo pending  2. HTN: blood pressures are uncontrolled. Will switch to coreg 3.125mg  BID. Likely titrate up.   3. HL: reports being Rx a statin, but not getting it  filled.  -- check lipids -- restarted on atorvastatin  4. NIDDM: hold metformin. On SSI -- check Hgb A1c  -- may be a good candidate for Jardiance on discharge  5. Tobacco use: cessation advised.    Signed, Reino Bellis, NP  08/09/2018, 8:55 AM  Pager # [phone]    I have seen, examined and evaluated the patient this AM along with Reino Bellis, NP-C.  After reviewing all the available data and chart, we discussed the patients laboratory, study & physical findings as well as symptoms in detail. I agree with her findings, examination as well as impression recommendations as per our discussion.    And that presented with signs and symptoms concerning for unstable angina.  She is now hypertensive and still having chest pain.  At worst it was 9 out of 10, currently intermittently having 5 out of 10 pain, but this morning was 9 out of 10 again. She had apparently a breakout of hives following CT with contrast yesterday.  Was given Benadryl.  Apparently was premedicated this morning with IV Benadryl and Solu-Medrol.  Will need to clarify that they were actually given.  With ongoing chest pain and positive troponins, she is being referred for urgent catheterization.  EKG really nonspecific, may be some mild subtle ST elevations in the lateral leads with depressions in anterior leads yesterday, but EKG not yet done this morning with chest pain.  Further plans post-cath   Performing MD:  Dr. Fletcher Anon  Procedure: [place]  The procedure with Risks/Benefits/Alternatives and Indications was reviewed with the patient.  All questions were answered.    Risks / Complications include, but not limited to: Death, MI, CVA/TIA, VF/VT (with defibrillation), Bradycardia (need for temporary pacer placement), contrast induced nephropathy, bleeding / bruising / hematoma / pseudoaneurysm, vascular or coronary injury  (with possible emergent CT or Vascular Surgery), adverse medication reactions, infection.  Additional risks involving the use of radiation with the possibility of radiation burns and cancer were explained in detail. -Premedication for contrast reaction discussed  The patient voices understanding and agree to proceed.     Glenetta Hew, M.D., M.S. Interventional Cardiologist   Pager # [phone] Phone # [phone] [address] [place], [place] [address]     For questions or updates, please contact [place] Please consult [email or web address] for contact info under Cardiology/STEMI.

## 2018-08-09 NOTE — Progress Notes (Signed)
Transferred -in from cardiac cath by bed with TR Band to right wrist. instructed to avoid moving arm and to always elevate it with pillow. Pulse ox to right thumb in used. Prefers to sit in a chair. Non- compliant at times.

## 2018-08-09 NOTE — Progress Notes (Signed)
Echocardiogram 2D Echocardiogram has been performed.  Matilde Bash 08/09/2018, 2:53 PM

## 2018-08-09 NOTE — Progress Notes (Signed)
[place] for Heparin>>aggrastat Indication: chest pain/ACS  Allergies  Allergen Reactions  . Contrast Media [Iodinated Diagnostic Agents] Hives, Shortness Of Breath and Itching    Pt broke out in large hives on her face after CT scan with contrast injection. Pt became slightly more short of breath than she was already after scan as well. Pt needs pre-meds prior to future studies.     Patient Measurements: Height: 5\' 6"  (167.6 cm) Weight: 198 lb 11.2 oz (90.1 kg) IBW/kg (Calculated) : 59.3 Heparin Dosing Weight: 78.9 kg  Vital Signs: Temp: 98.2 F (36.8 C) (08/28 0331) Temp Source: Oral (08/28 0331) BP: 122/85 (08/28 1310) Pulse Rate: 85 (08/28 1310)  Labs: Recent Labs    08/08/18 2026 08/09/18 0030 08/09/18 0448  HGB 13.4  --   --   HCT 41.0  --   --   PLT 273  --   --   APTT  --  188*  --   LABPROT  --  14.4  --   INR  --  1.13  --   CREATININE 1.05*  --  1.13*  TROPONINI  --   --  0.83*    Estimated Creatinine Clearance: 61.3 mL/min (A) (by C-G formula based on SCr of 1.13 mg/dL (H)).   Medical History: Past Medical History:  Diagnosis Date  . Diabetes mellitus without complication ([place])   . Hypertension     Medications:  Infusions:  . sodium chloride 1 mL/kg/hr (08/09/18 1156)  . nitroGLYCERIN 30 mcg/min (08/09/18 1135)  . tirofiban      Assessment: 38 yof presented to OSH with CP - started on heparin infusion. Initial level not obtained prior to timing of cath. In cath found to have severe 1 vessel CAD with subtotal thrombotic occlusion of prox RCA and large thrombus extending into mid-segment. Heparin infusion discontinued and plan to continue aggrastat for 18 hours due to residual thrombus in distal RCA.  No s/sx of bleeding. CBC stable this morning. Scr 1.13 (CrCl 61 mL/min).    Plan:  Discontinue heparin infusion post-cath Continue tirofiban 0.15 mcg/kg/hr for 18 hours from cath Monitor CBC, renal fx, and  for s/sx of bleeding Stop date already on order   Doylene Canard, PharmD Clinical Pharmacist  Pager: [phone] Phone: [phone] 08/09/2018,1:41 PM

## 2018-08-09 NOTE — H&P (View-Only) (Signed)
Progress Note  Patient Name: Rebecca Cuevas Date of Encounter: 08/09/2018  Primary Cardiologist: No primary care provider on file.  Subjective   Still having chest pain this morning, despite nitro gtt. Rates 9/10.   Inpatient Medications    Scheduled Meds: . aspirin EC  81 mg Oral Daily  . atorvastatin  80 mg Oral q1800  . insulin aspart  0-15 Units Subcutaneous TID WC  . insulin aspart  0-5 Units Subcutaneous QHS  . metoprolol tartrate  12.5 mg Oral BID   Continuous Infusions: . heparin 1,100 Units/hr (08/09/18 0458)  . nitroGLYCERIN 20 mcg/min (08/09/18 0458)   PRN Meds: acetaminophen, alum & mag hydroxide-simeth, nitroGLYCERIN, ondansetron (ZOFRAN) IV   Vital Signs    Vitals:   08/09/18 0200 08/09/18 0230 08/09/18 0331 08/09/18 0347  BP: (!) 175/93 (!) 143/93  (!) 160/94  Pulse: 92 93 71 92  Resp: 20 18 18    Temp:   98.2 F (36.8 C)   TempSrc:   Oral   SpO2: 97% 97% 100%   Weight:   90.1 kg   Height:   5\' 6"  (1.676 m)     Intake/Output Summary (Last 24 hours) at 08/09/2018 0855 Last data filed at 08/09/2018 0458 Gross per 24 hour  Intake 118.98 ml  Output -  Net 118.98 ml   Filed Weights   08/08/18 1919 08/09/18 0331  Weight: 91.6 kg 90.1 kg    Telemetry    SR - Personally Reviewed  ECG    SR (08/08/18) - Personally Reviewed  Physical Exam   General: Well developed, well nourished, female appearing in no acute distress. Head: Normocephalic, atraumatic.  Neck: Supple without bruits, JVD. Lungs:  Resp regular and unlabored, CTA. Heart: RRR, S1, S2, no murmur; no rub. Abdomen: Soft, non-tender, non-distended with normoactive bowel sounds. No hepatomegaly. No rebound/guarding. No obvious abdominal masses. Extremities: No clubbing, cyanosis, edema. Distal pedal pulses are 2+ bilaterally. Neuro: Alert and oriented X 3. Moves all extremities spontaneously. Psych: Normal affect.  Labs    Chemistry Recent Labs  Lab 08/08/18 2026  08/09/18 0448  NA 139 136  K 3.7 3.9  CL 103 101  CO2 25 20*  GLUCOSE 160* 319*  BUN 17 13  CREATININE 1.05* 1.13*  CALCIUM 9.5 9.4  GFRNONAA 57* 53*  GFRAA >60 >60  ANIONGAP 11 15     Hematology Recent Labs  Lab 08/08/18 2026  WBC 16.0*  RBC 4.94  HGB 13.4  HCT 41.0  MCV 83.0  MCH 27.1  MCHC 32.7  RDW 14.8  PLT 273    Cardiac Enzymes Recent Labs  Lab 08/09/18 0448  TROPONINI 0.83*    Recent Labs  Lab 08/08/18 2030 08/08/18 2311  TROPIPOC 0.11* 0.33*     BNPNo results for input(s): BNP, PROBNP in the last 168 hours.   DDimer No results for input(s): DDIMER in the last 168 hours.    Radiology    Dg Chest 2 View  Result Date: 08/08/2018 CLINICAL DATA:  Anterior midline chest pain EXAM: CHEST - 2 VIEW COMPARISON:  08/06/2013 FINDINGS: Heart and mediastinal contours are within normal limits. No focal opacities or effusions. No acute bony abnormality. IMPRESSION: No active cardiopulmonary disease. Electronically Signed   By: Rolm Baptise M.D.   On: 08/08/2018 20:03   Ct Angio Chest Pe W/cm &/or Wo Cm  Result Date: 08/08/2018 CLINICAL DATA:  Chest pain and shortness of breath EXAM: CT ANGIOGRAPHY CHEST WITH CONTRAST TECHNIQUE: Multidetector CT imaging of the  chest was performed using the standard protocol during bolus administration of intravenous contrast. Multiplanar CT image reconstructions and MIPs were obtained to evaluate the vascular anatomy. CONTRAST:  133mL ISOVUE-370 IOPAMIDOL (ISOVUE-370) INJECTION 76% COMPARISON:  None. FINDINGS: Cardiovascular: --Pulmonary arteries: Contrast injection is sufficient to demonstrate satisfactory opacification of the pulmonary arteries to the segmental level. There is no pulmonary embolus. The main pulmonary artery is within normal limits for size. --Aorta: Satisfactory opacification of the thoracic aorta. No aortic dissection or other acute aortic syndrome. Conventional 3 vessel aortic branching pattern. The aortic course  and caliber are normal. There is no aortic atherosclerosis. --Heart: Normal size. No pericardial effusion. Mediastinum/Nodes: No mediastinal, hilar or axillary lymphadenopathy. The visualized thyroid and thoracic esophageal course are unremarkable. Lungs/Pleura: No pulmonary nodules or masses. No pleural effusion or pneumothorax. No focal airspace consolidation. No focal pleural abnormality. Upper Abdomen: Contrast bolus timing is not optimized for evaluation of the abdominal organs. Within this limitation, the visualized organs of the upper abdomen are normal. Musculoskeletal: No chest wall abnormality. No acute or significant osseous findings. Review of the MIP images confirms the above findings. IMPRESSION: No pulmonary embolus or acute aortic syndrome. Electronically Signed   By: Ulyses Jarred M.D.   On: 08/08/2018 22:28    Cardiac Studies   N/a   Patient Profile     57 y.o. female with PMH of tobacco use, HTN, HL and DM presented with chest pain, thought it GERD. Found to have a positive troponin, and transferred to [facility] for further work up.   Assessment & Plan    1. NSTEMI: trop up to 0.83 this morning. Remains on IV heparin and nitro gtt. Still with active chest pain this morning. Does have concerning RFs, does not know her family hx. Will plan for cardiac cath today. Plan to take soon given her active pain. Of note, she reports seeing Dr. Wynonia Lawman as an outpatient about 2 weeks ago and planned for possible outpatient stress test.  -- The patient understands that risks included but are not limited to stroke (1 in 1000), death (1 in 1000), kidney failure [usually temporary] (1 in 500), bleeding (1 in 200), allergic reaction [possibly serious] (1 in 200).  -- Will give premeds now for contrast allergy.  -- ASA, statin, BB -- echo pending  2. HTN: blood pressures are uncontrolled. Will switch to coreg 3.125mg  BID. Likely titrate up.   3. HL: reports being Rx a statin, but not getting it  filled.  -- check lipids -- restarted on atorvastatin  4. NIDDM: hold metformin. On SSI -- check Hgb A1c  -- may be a good candidate for Jardiance on discharge  5. Tobacco use: cessation advised.    Signed, Reino Bellis, NP  08/09/2018, 8:55 AM  Pager # [phone]    I have seen, examined and evaluated the patient this AM along with Reino Bellis, NP-C.  After reviewing all the available data and chart, we discussed the patients laboratory, study & physical findings as well as symptoms in detail. I agree with her findings, examination as well as impression recommendations as per our discussion.    And that presented with signs and symptoms concerning for unstable angina.  She is now hypertensive and still having chest pain.  At worst it was 9 out of 10, currently intermittently having 5 out of 10 pain, but this morning was 9 out of 10 again. She had apparently a breakout of hives following CT with contrast yesterday.  Was given Benadryl.  Apparently was premedicated this morning with IV Benadryl and Solu-Medrol.  Will need to clarify that they were actually given.  With ongoing chest pain and positive troponins, she is being referred for urgent catheterization.  EKG really nonspecific, may be some mild subtle ST elevations in the lateral leads with depressions in anterior leads yesterday, but EKG not yet done this morning with chest pain.  Further plans post-cath   Performing MD:  Dr. Fletcher Anon  Procedure: [place]  The procedure with Risks/Benefits/Alternatives and Indications was reviewed with the patient.  All questions were answered.    Risks / Complications include, but not limited to: Death, MI, CVA/TIA, VF/VT (with defibrillation), Bradycardia (need for temporary pacer placement), contrast induced nephropathy, bleeding / bruising / hematoma / pseudoaneurysm, vascular or coronary injury  (with possible emergent CT or Vascular Surgery), adverse medication reactions, infection.  Additional risks involving the use of radiation with the possibility of radiation burns and cancer were explained in detail. -Premedication for contrast reaction discussed  The patient voices understanding and agree to proceed.     Glenetta Hew, M.D., M.S. Interventional Cardiologist   Pager # [phone] Phone # [phone] [address] [place], [place] [address]     For questions or updates, please contact [place] Please consult [email or web address] for contact info under Cardiology/STEMI.

## 2018-08-09 NOTE — Progress Notes (Signed)
ANTICOAGULATION CONSULT NOTE - Initial Consult  Pharmacy Consult for Heparin Indication: chest pain/ACS  Allergies  Allergen Reactions  . Contrast Media [Iodinated Diagnostic Agents] Hives, Shortness Of Breath and Itching    Pt broke out in large hives on her face after CT scan with contrast injection. Pt became slightly more short of breath than she was already after scan as well. Pt needs pre-meds prior to future studies.     Patient Measurements: Height: 5\' 6"  (167.6 cm) Weight: 202 lb (91.6 kg) IBW/kg (Calculated) : 59.3 Heparin Dosing Weight:   Vital Signs: Temp: 98.2 F (36.8 C) (08/27 1919) Temp Source: Oral (08/27 1919) BP: 172/102 (08/27 2354) Pulse Rate: 93 (08/27 2354)  Labs: Recent Labs    08/08/18 2026  HGB 13.4  HCT 41.0  PLT 273  CREATININE 1.05*    Estimated Creatinine Clearance: 66.6 mL/min (A) (by C-G formula based on SCr of 1.05 mg/dL (H)).   Medical History: Past Medical History:  Diagnosis Date  . Diabetes mellitus without complication ([place])   . Hypertension     Medications:  Infusions:  . heparin    . nitroGLYCERIN      Assessment: Patient in ED with chest pain and now + Troponin.  No oral anticoagulants noted on med rec.  Baseline coags ordered.  Goal of Therapy:  Heparin level 0.3-0.7 units/ml Monitor platelets by anticoagulation protocol: Yes   Plan:  Heparin bolus 4000 units iv x1 Heparin drip at  1100 units/hr Daily CBC Next heparin level at 0800    Nani Skillern Crowford 08/09/2018,12:04 AM

## 2018-08-09 NOTE — ED Notes (Signed)
[facility] called for transport. 

## 2018-08-09 NOTE — H&P (Signed)
Patient ID: Rebecca Cuevas MRN: [id number], DOB/AGE: Sep 14, 1961   Admit date: 08/08/2018   Primary Physician: Donnamae Jude, MD Primary Cardiologist: None  Pt. Profile: 57 yo female w/ history of tobacco use (>30pckyr), HDL, HTN and DMII who presented with chest pain, found to have NSTEMI  Problem List  Past Medical History:  Diagnosis Date  . Diabetes mellitus without complication ([place])   . Hypertension     Past Surgical History:  Procedure Laterality Date  . ABDOMINAL HYSTERECTOMY    . CESAREAN SECTION    . HERNIA REPAIR       Allergies  Allergies  Allergen Reactions  . Contrast Media [Iodinated Diagnostic Agents] Hives, Shortness Of Breath and Itching    Pt broke out in large hives on her face after CT scan with contrast injection. Pt became slightly more short of breath than she was already after scan as well. Pt needs pre-meds prior to future studies.     HPI Rebecca Cuevas over the last several weeks has had increasing epispodes of epigastric discomfort - initially attributated to indigestion.  However yesterday at 5pm, she was finishing washing her hair when she had an onset of chest pressure that remained for several hours. Some nausea. She came to the ED and was given SLN with some improvement.   Her Trop I went from 0.11 to 0.33, with SBP 140-180s.  Home Medications  Prior to Admission medications   Medication Sig Start Date End Date Taking? Authorizing Provider  alum & mag hydroxide-simeth (MAALOX/MYLANTA) 200-200-20 MG/5ML suspension Take by mouth every 6 (six) hours as needed for indigestion or heartburn.   Yes [provider]  atorvastatin (LIPITOR) 40 MG tablet Take 40 mg by mouth daily. 06/26/18  Yes [provider]  hydrochlorothiazide (HYDRODIURIL) 25 MG tablet Take 1 tablet (25 mg total) by mouth daily. 05/18/18  Yes Donnamae Jude, MD  hydrocortisone cream 1 % APPLY ONE APPLICATION TOPICALLY ONCE DAILY AS NEEDED FOR ITCHING (FOR  ECZEMA). 05/19/16  Yes Donnamae Jude, MD  metFORMIN (GLUCOPHAGE) 500 MG tablet Take 1 tablet (500 mg total) by mouth 2 (two) times daily with a meal. 05/18/18  Yes Donnamae Jude, MD  Multiple Vitamins-Minerals (COMPLETE MULTIVITAMIN/MINERAL PO) Take by mouth.   Yes [provider]  vitamin B-12 (CYANOCOBALAMIN) 500 MCG tablet Take 500 mcg by mouth daily.   Yes [provider]  vitamin C (ASCORBIC ACID) 500 MG tablet Take 500 mg by mouth daily.   Yes [provider]  Blood Glucose Monitoring Suppl (BAYER CONTOUR NEXT MONITOR) w/Device KIT 1 Device by Does not apply route 2 (two) times daily. 02/24/17   Donnamae Jude, MD  esomeprazole (NEXIUM) 20 MG capsule Take 1 capsule (20 mg total) by mouth daily for 14 days. Patient not taking: Reported on 08/08/2018 07/15/18 07/29/18  Ok Edwards, PA-C  glucose blood (BAYER CONTOUR NEXT TEST) test strip Use as instructed 02/24/17   Donnamae Jude, MD  Lancet Devices (MICROLET NEXT LANCING DEVICE) MISC 1 Device by Does not apply route 2 (two) times daily. 02/24/17   Donnamae Jude, MD  mupirocin ointment (BACTROBAN) 2 % Apply 1 application topically 2 (two) times daily. Patient not taking: Reported on 08/08/2018 01/16/18   Ok Edwards, PA-C  nicotine (NICODERM CQ) 14 mg/24hr patch Place 1 patch (14 mg total) onto the skin daily. Patient not taking: Reported on 08/08/2018 05/18/18   Donnamae Jude, MD  nystatin (MYCOSTATIN/NYSTOP) powder Apply  topically 4 (four) times daily. Patient not taking: Reported on 05/18/2018 11/23/17   Donnamae Jude, MD  ondansetron (ZOFRAN ODT) 4 MG disintegrating tablet Take 1 tablet (4 mg total) by mouth every 8 (eight) hours as needed for nausea or vomiting. Patient not taking: Reported on 08/08/2018 07/15/18   Arturo Morton    Family History  Family History  Problem Relation Age of Onset  . Hypertension Mother   . Diabetes Father   . Colon cancer Neg Hx     Social History  Social History   Socioeconomic History   . Marital status: Single    Spouse name: Not on file  . Number of children: Not on file  . Years of education: Not on file  . Highest education level: Not on file  Occupational History  . Not on file  Social Needs  . Financial resource strain: Not on file  . Food insecurity:    Worry: Not on file    Inability: Not on file  . Transportation needs:    Medical: Not on file    Non-medical: Not on file  Tobacco Use  . Smoking status: Current Every Day Smoker    Packs/day: 0.50  . Smokeless tobacco: Never Used  . Tobacco comment: 8 cigs a day  Substance and Sexual Activity  . Alcohol use: Yes    Alcohol/week: 0.0 standard drinks    Comment: occasional  . Drug use: No  . Sexual activity: Yes    Birth control/protection: Surgical  Lifestyle  . Physical activity:    Days per week: Not on file    Minutes per session: Not on file  . Stress: Not on file  Relationships  . Social connections:    Talks on phone: Not on file    Gets together: Not on file    Attends religious service: Not on file    Active member of club or organization: Not on file    Attends meetings of clubs or organizations: Not on file    Relationship status: Not on file  . Intimate partner violence:    Fear of current or ex partner: Not on file    Emotionally abused: Not on file    Physically abused: Not on file    Forced sexual activity: Not on file  Other Topics Concern  . Not on file  Social History Narrative  . Not on file     Review of Systems General:  No chills, fever, night sweats or weight changes.  Cardiovascular:  +chest pain, no dyspnea on exertion, edema, orthopnea, palpitations, paroxysmal nocturnal dyspnea. Dermatological: No rash, lesions/masses Respiratory: No cough, dyspnea Urologic: No hematuria, dysuria Abdominal:   No nausea, vomiting, diarrhea, bright red blood per rectum, melena, or hematemesis Neurologic:  No visual changes, wkns, changes in mental status. All other systems  reviewed and are otherwise negative except as noted above.  Physical Exam  Blood pressure (!) 160/94, pulse 92, temperature 98.2 F (36.8 C), temperature source Oral, resp. rate 18, height _0  (1.676 m), weight 90.1 kg, SpO2 100 %.  General: Pleasant, NAD Psych: Normal affect. Neuro: Alert and oriented X 3. Moves all extremities spontaneously. HEENT: Normal  Neck: Supple without bruits or JVD. Lungs:  Resp regular and unlabored, CTA. Heart: RRR no s3, s4, or murmurs. Abdomen: Soft, non-tender, non-distended, BS + x 4.  Extremities: No clubbing, cyanosis or edema. DP/PT/Radials 2+ and equal bilaterally.  Labs  Troponin [facility] Test) Recent Labs  08/08/18 2311  TROPIPOC 0.33*   No results for input(s): CKTOTAL, CKMB, TROPONINI in the last 72 hours. Lab Results  Component Value Date   WBC 16.0 (H) 08/08/2018   HGB 13.4 08/08/2018   HCT 41.0 08/08/2018   MCV 83.0 08/08/2018   PLT 273 08/08/2018    Recent Labs  Lab 08/08/18 2026  NA 139  K 3.7  CL 103  CO2 25  BUN 17  CREATININE 1.05*  CALCIUM 9.5  GLUCOSE 160*   Lab Results  Component Value Date   CHOL 199 01/27/2017   HDL 44 (L) 01/27/2017   LDLCALC NOT CALC 01/27/2017   TRIG 458 (H) 01/27/2017   No results found for: DDIMER   Radiology/Studies  Dg Chest 2 View  Result Date: 08/08/2018 CLINICAL DATA:  Anterior midline chest pain EXAM: CHEST - 2 VIEW COMPARISON:  08/06/2013 FINDINGS: Heart and mediastinal contours are within normal limits. No focal opacities or effusions. No acute bony abnormality. IMPRESSION: No active cardiopulmonary disease. Electronically Signed   By: Rolm Baptise M.D.   On: 08/08/2018 20:03   Ct Angio Chest Pe W/cm &/or Wo Cm  Result Date: 08/08/2018 CLINICAL DATA:  Chest pain and shortness of breath EXAM: CT ANGIOGRAPHY CHEST WITH CONTRAST TECHNIQUE: Multidetector CT imaging of the chest was performed using the standard protocol during bolus administration of intravenous  contrast. Multiplanar CT image reconstructions and MIPs were obtained to evaluate the vascular anatomy. CONTRAST:  183m ISOVUE-370 IOPAMIDOL (ISOVUE-370) INJECTION 76% COMPARISON:  None. FINDINGS: Cardiovascular: --Pulmonary arteries: Contrast injection is sufficient to demonstrate satisfactory opacification of the pulmonary arteries to the segmental level. There is no pulmonary embolus. The main pulmonary artery is within normal limits for size. --Aorta: Satisfactory opacification of the thoracic aorta. No aortic dissection or other acute aortic syndrome. Conventional 3 vessel aortic branching pattern. The aortic course and caliber are normal. There is no aortic atherosclerosis. --Heart: Normal size. No pericardial effusion. Mediastinum/Nodes: No mediastinal, hilar or axillary lymphadenopathy. The visualized thyroid and thoracic esophageal course are unremarkable. Lungs/Pleura: No pulmonary nodules or masses. No pleural effusion or pneumothorax. No focal airspace consolidation. No focal pleural abnormality. Upper Abdomen: Contrast bolus timing is not optimized for evaluation of the abdominal organs. Within this limitation, the visualized organs of the upper abdomen are normal. Musculoskeletal: No chest wall abnormality. No acute or significant osseous findings. Review of the MIP images confirms the above findings. IMPRESSION: No pulmonary embolus or acute aortic syndrome. Electronically Signed   By: KUlyses JarredM.D.   On: 08/08/2018 22:28    ECG Sinus, LAE  Echocardiogram Pending  ======================== ASSESSMENT AND PLAN 57yo female w/ history of tobacco use (>30pckyr), HDL, HTN and DMII who presented with chest pain, found to have NSTEMI  # NSTEMI: risk factors include smoking history, HTN, HLD and DMII.  - Heparin gtt - ASA/statin/metop - NPO for likely LHC  # Smoking: actively smoking 1/2ppd, however ready to quite after this episode  # DMII - ISS  FULL CODE   Signed, JCharlies Silvers MD Overnight Cardiology Fellow

## 2018-08-09 NOTE — Interval H&P Note (Signed)
Cath Lab Visit (complete for each Cath Lab visit)  Clinical Evaluation Leading to the Procedure:   ACS: Yes.       History and Physical Interval Note:  08/09/2018 10:27 AM  [name]  has presented today for surgery, with the diagnosis of cp  The various methods of treatment have been discussed with the patient and family. After consideration of risks, benefits and other options for treatment, the patient has consented to  Procedure(s): LEFT HEART CATH AND CORONARY ANGIOGRAPHY (N/A) as a surgical intervention .  The patient's history has been reviewed, patient examined, no change in status, stable for surgery.  I have reviewed the patient's chart and labs.  Questions were answered to the patient's satisfaction.     Kathlyn Sacramento

## 2018-08-09 NOTE — Progress Notes (Signed)
Latest troponin-13.19 text paged to NP with no order.

## 2018-08-10 ENCOUNTER — Telehealth: Payer: Self-pay | Admitting: Adult Health

## 2018-08-10 DIAGNOSIS — I25110 Atherosclerotic heart disease of native coronary artery with unstable angina pectoris: Secondary | ICD-10-CM

## 2018-08-10 DIAGNOSIS — Z955 Presence of coronary angioplasty implant and graft: Secondary | ICD-10-CM

## 2018-08-10 HISTORY — PX: TRANSTHORACIC ECHOCARDIOGRAM: SHX275

## 2018-08-10 LAB — BASIC METABOLIC PANEL
Anion gap: 8 (ref 5–15)
BUN: 17 mg/dL (ref 6–20)
CO2: 23 mmol/L (ref 22–32)
Calcium: 8.9 mg/dL (ref 8.9–10.3)
Chloride: 107 mmol/L (ref 98–111)
Creatinine, Ser: 1.06 mg/dL — ABNORMAL HIGH (ref 0.44–1.00)
GFR calc Af Amer: >60 mL/min (ref >60)
GFR, EST NON AFRICAN AMERICAN: 57 mL/min — AB (ref >60)
GLUCOSE: 196 mg/dL — AB (ref 70–99)
Potassium: 3.8 mmol/L (ref 3.5–5.1)
SODIUM: 138 mmol/L (ref 135–145)

## 2018-08-10 LAB — POCT ACTIVATED CLOTTING TIME
Activated Clotting Time: 560 seconds
Activated Clotting Time: 433 seconds

## 2018-08-10 LAB — CBC
HCT: 35.4 % — ABNORMAL LOW (ref 36.0–46.0)
Hemoglobin: 11.4 g/dL — ABNORMAL LOW (ref 12.0–15.0)
MCH: 26.8 pg (ref 26.0–34.0)
MCHC: 32.2 g/dL (ref 30.0–36.0)
MCV: 83.1 fL (ref 78.0–100.0)
PLATELETS: 243 10*3/uL (ref 150–400)
RBC: 4.26 MIL/uL (ref 3.87–5.11)
RDW: 14.7 % (ref 11.5–15.5)
WBC: 17.8 10*3/uL — ABNORMAL HIGH (ref 4.0–10.5)

## 2018-08-10 LAB — GLUCOSE, CAPILLARY
Glucose-Capillary: 131 mg/dL — ABNORMAL HIGH (ref 70–99)
Glucose-Capillary: 121 mg/dL — ABNORMAL HIGH (ref 70–99)
Glucose-Capillary: 118 mg/dL — ABNORMAL HIGH (ref 70–99)
Glucose-Capillary: 195 mg/dL — ABNORMAL HIGH (ref 70–99)

## 2018-08-10 MED ORDER — LOSARTAN POTASSIUM 25 MG PO TABS
25.0000 mg | ORAL_TABLET | Freq: Every day | ORAL | Status: DC
  Administered 2018-08-10 – 2018-08-11 (×2): 25 mg via ORAL
  Filled 2018-08-10 (×2): qty 1

## 2018-08-10 NOTE — Plan of Care (Signed)
Nutrition Education Note  RD consulted for nutrition education regarding DM and Heart Heathy diet.   Lab Results  Component Value Date   HGBA1C 6.6 (H) 08/09/2018   PTA DM medications 500 mg metformin BID.   Labs reviewed: CBGS: [id number] (inpatient orders for glycemic control are 0-9 units insulin aspart TID with meals and 0-5 units insulin aspart q HS).   Spoke with pt at bedside, who reports she works as a [employer] and "the nurses get on me about my DM". Pt reports she does not cook often at home, but she does not use salt when cooking. 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 Pt reports she avoids fast food due to sodium content. Commonly consumed beverages a sweet tea, water, and coffee.   RD provided "Heart Healthy, Carb Modified Nutrition Therapy" handout from the Academy of [employer] and Dietetics. Reviewed patient's dietary recall. Provided examples on ways to decrease sodium intake in diet. Discouraged intake of processed foods and use of salt shaker. Encouraged fresh fruits and vegetables as well as whole grain sources of carbohydrates to maximize fiber intake.   RD discussed why it is important for patient to adhere to diet recommendations, and emphasized the role of fluids, foods to avoid, and importance of weighing self daily. Teach back method used.  Expect fair compliance.  Body mass index is 32.07 kg/m. Pt meets criteria for obesity, class I based on current BMI.  Current diet order is Heart Healthy/ Carb Modified, patient is consuming approximately 75-100% of meals at this time. Labs and medications reviewed. No further nutrition interventions warranted at this time. RD contact information provided. If additional nutrition issues arise, please re-consult RD.   Sherleen Pangborn A. Jimmye Norman, RD, LDN, CDE Pager: [phone] After hours Pager: [phone]

## 2018-08-10 NOTE — Progress Notes (Signed)
Pt sts she just got back from walking hallway. Sts she had no problems, felt well. Discussed MI, stent, Brilinta, diet, ex, smoking cessation, NTG, and CRPII. Good reception. She sts she plans to quit smoking and was thankful for the fake cigarette and resources. Will refer to [place]. Encouraged 1-2 more walks this pm. [phone] Yves Dill CES, ACSM 2:41 PM 08/10/2018

## 2018-08-10 NOTE — Telephone Encounter (Signed)
Currently admitted as of 08/10/18

## 2018-08-10 NOTE — Progress Notes (Addendum)
Progress Note  Patient Name: Rebecca Cuevas Date of Encounter: 08/10/2018  Primary Cardiologist: Glenetta Hew, MD  Subjective   Reports resolution of chest pain following LHC. Has a mild headache this morning from the nitroglycerin. Denies SOB or palpitations.   Inpatient Medications    Scheduled Meds: . aspirin EC  81 mg Oral Daily  . atorvastatin  80 mg Oral q1800  . carvedilol  6.25 mg Oral BID WC  . enoxaparin (LOVENOX) injection  40 mg Subcutaneous Q24H  . insulin aspart  0-15 Units Subcutaneous TID WC  . insulin aspart  0-5 Units Subcutaneous QHS  . losartan  25 mg Oral Daily  . sodium chloride flush  3 mL Intravenous Q12H  . ticagrelor  90 mg Oral BID   Continuous Infusions: . sodium chloride    . sodium chloride     PRN Meds: sodium chloride, acetaminophen, alum & mag hydroxide-simeth, nitroGLYCERIN, ondansetron (ZOFRAN) IV, sodium chloride flush   Vital Signs    Vitals:   08/09/18 1958 08/09/18 2328 08/10/18 0348 08/10/18 0734  BP: 138/83 (!) 117/57 128/79   Pulse: (!) 106 (!) 105 (!) 103   Resp: 18 20 17    Temp: 98.8 F (37.1 C) 99 F (37.2 C) 98.6 F (37 C) 98.5 F (36.9 C)  TempSrc: Oral Oral Oral Oral  SpO2: 94% 95% 98%   Weight:      Height:        Intake/Output Summary (Last 24 hours) at 08/10/2018 1106 Last data filed at 08/10/2018 0800 Gross per 24 hour  Intake 2001.31 ml  Output -  Net 2001.31 ml   Filed Weights   08/08/18 1919 08/09/18 0331  Weight: 91.6 kg 90.1 kg    Telemetry    NSR with brief episodes of sinus tachycardia - Personally Reviewed  Physical Exam   GEN: Laying in bed in no acute distress.   Neck: No JVD, no carotid bruits Cardiac: RRR, no murmurs, rubs, or gallops. Right radial cath site without significant hematoma or ecchymosis. Dressing C/D/I Respiratory: Clear to auscultation bilaterally, no wheezes/ rales/ rhonchi GI: NABS, Soft, obese, nontender, non-distended  MS: No edema; No deformity. Neuro:   Nonfocal, moving all extremities spontaneously Psych: Normal affect   Labs    Chemistry Recent Labs  Lab 08/08/18 2026 08/09/18 0448 08/10/18 0351  NA 139 136 138  K 3.7 3.9 3.8  CL 103 101 107  CO2 25 20* 23  GLUCOSE 160* 319* 196*  BUN 17 13 17   CREATININE 1.05* 1.13* 1.06*  CALCIUM 9.5 9.4 8.9  GFRNONAA 57* 53* 57*  GFRAA >60 >60 >60  ANIONGAP 11 15 8      Hematology Recent Labs  Lab 08/08/18 2026 08/09/18 1403 08/10/18 0351  WBC 16.0* 11.5* 17.8*  RBC 4.94 4.71 4.26  HGB 13.4 12.4 11.4*  HCT 41.0 38.2 35.4*  MCV 83.0 81.1 83.1  MCH 27.1 26.3 26.8  MCHC 32.7 32.5 32.2  RDW 14.8 14.4 14.7  PLT 273 255 243    Cardiac Enzymes Recent Labs  Lab 08/09/18 0448 08/09/18 1521 08/09/18 2015  TROPONINI 0.83* 13.19* 15.14*    Recent Labs  Lab 08/08/18 2030 08/08/18 2311  TROPIPOC 0.11* 0.33*     BNPNo results for input(s): BNP, PROBNP in the last 168 hours.   DDimer No results for input(s): DDIMER in the last 168 hours.   Radiology    Dg Chest 2 View  Result Date: 08/08/2018 CLINICAL DATA:  Anterior midline chest pain EXAM: CHEST -  2 VIEW COMPARISON:  08/06/2013 FINDINGS: Heart and mediastinal contours are within normal limits. No focal opacities or effusions. No acute bony abnormality. IMPRESSION: No active cardiopulmonary disease. Electronically Signed   By: Rolm Baptise M.D.   On: 08/08/2018 20:03   Ct Angio Chest Pe W/cm &/or Wo Cm  Result Date: 08/08/2018 CLINICAL DATA:  Chest pain and shortness of breath EXAM: CT ANGIOGRAPHY CHEST WITH CONTRAST TECHNIQUE: Multidetector CT imaging of the chest was performed using the standard protocol during bolus administration of intravenous contrast. Multiplanar CT image reconstructions and MIPs were obtained to evaluate the vascular anatomy. CONTRAST:  146mL ISOVUE-370 IOPAMIDOL (ISOVUE-370) INJECTION 76% COMPARISON:  None. FINDINGS: Cardiovascular: --Pulmonary arteries: Contrast injection is sufficient to  demonstrate satisfactory opacification of the pulmonary arteries to the segmental level. There is no pulmonary embolus. The main pulmonary artery is within normal limits for size. --Aorta: Satisfactory opacification of the thoracic aorta. No aortic dissection or other acute aortic syndrome. Conventional 3 vessel aortic branching pattern. The aortic course and caliber are normal. There is no aortic atherosclerosis. --Heart: Normal size. No pericardial effusion. Mediastinum/Nodes: No mediastinal, hilar or axillary lymphadenopathy. The visualized thyroid and thoracic esophageal course are unremarkable. Lungs/Pleura: No pulmonary nodules or masses. No pleural effusion or pneumothorax. No focal airspace consolidation. No focal pleural abnormality. Upper Abdomen: Contrast bolus timing is not optimized for evaluation of the abdominal organs. Within this limitation, the visualized organs of the upper abdomen are normal. Musculoskeletal: No chest wall abnormality. No acute or significant osseous findings. Review of the MIP images confirms the above findings. IMPRESSION: No pulmonary embolus or acute aortic syndrome. Electronically Signed   By: Ulyses Jarred M.D.   On: 08/08/2018 22:28    Cardiac Studies   Left heart catheterization 08/09/18:  The left ventricular systolic function is normal.  LV end diastolic pressure is normal.  The left ventricular ejection fraction is 50-55% by visual estimate.  Prox RCA lesion is 99% stenosed.  Post intervention, there is a 0% residual stenosis.  A drug-eluting stent was successfully placed using a STENT SYNERGY DES 3.5X20.  Post Atrio lesion is 80% stenosed.  Prox Cx to Mid Cx lesion is 30% stenosed.  Prox LAD to Mid LAD lesion is 40% stenosed.   1.  Severe one-vessel coronary artery disease with subtotal thrombotic occlusion of the proximal right coronary artery with very large thrombus extending into the mid segment and evidence of embolization into the  posterior AV groove branch. 2.  Low normal LV systolic function with an EF of 50% with inferior wall hypokinesis.  Normal LVEDP. 3.  Successful aspiration thrombectomy and drug-eluting stent placement to the proximal right coronary artery.  Aspiration thrombectomy was also performed on the posterior AV groove branch.  Slow flow developed after stent placement which was treated with intracoronary adenosine given distally and proximally.  There was residual thrombus in the distal RCA distribution which was left to be treated with Aggrastat overnight.  Recommendations: Dual antiplatelet therapy for at least one year. Aggressive treatment of risk factors.  Recommend uninterrupted dual antiplatelet therapy with Aspirin 81mg  daily and Ticagrelor 90mg  twice daily for a minimum of 12 months (ACS - Class I recommendation).  Echocardiogram 08/09/18: Study Conclusions  - Left ventricle: The cavity size was normal. There was mild  concentric hypertrophy. Systolic function was vigorous. The  estimated ejection fraction was in the range of 65% to 70%. Wall motion was normal; there were no regional wall motion abnormalities. There was an increased relative  contribution of atrial contraction to ventricular filling. Doppler parameters are consistent with abnormal left ventricular relaxation (grade 1   diastolic dysfunction).  Mitral valve: There was trivial regurgitation.  Patient Profile     57 y.o. female with PMH of tobacco use, HTN, HL and DM presented with chest pain, thought it GERD. Found to have a positive troponin, and transferred to [facility] for further work up.  She did rule in for non-ST elevation MI, and the morning after presentation had recurrence of chest pain, taken urgently for cardiac catheterization with results noted above.  Assessment & Plan    1. NSTEMI: patient presented with chest pain. Troponin peaked at 15.14. She underwent a LHC 08/09/18 which revealed single vessel CAD with subtotal  thrombotic occlusion of the pRCA with very large thrombus extending into the mid segment and evidence of emobolization into the posterior AV groove branch. This was managed with successful aspiration thrombectomy and DES to pRCA, as well as aspiration thrombectomy on the posterior AV groove branch. She was recommended for DAPT with aspirin and brilinta for a minimum of 12 months and aggressive risk factor modifications. Echo with EF 65-70%, no wall motion abnormalities, and G1DD. Chest pain has resolved and her nitro gtt was discontinued.  - Continue aspirin and brilinta --Brilinta at current dose for minimum 1 year.  Would be okay to stop aspirin after 3 months.  Would like to continue Brilinta for at least 1 more year at reduced dose. - Continue statin  - Given extent of disease (and troponin elevation) and intervention required during LHC, will monitor overnight. Anticipate discharge home tomorrow.   2. HTN: BP improved with coreg 6.25mg  BID. Nitro gtt discontinued this morning. - Continue coreg 6.25mg  BID - Will re-start losartan 25mg  daily given stable Cr post cath  3. HLD: LDL poorly controlled at 149 this admission; goal <70. Has not been taking a statin despite this being prescribed. Started on atorvastatin 80mg  daily - Continue atorvastatin   4. DM type 2: Hgb A1C 6.6 this admission; at goal of <7 - Resume metformin 48 hours after cath - Consider Jardiance going forward if Hgb A1C is persistently >7.   5. Tobacco abuse: - Counseled on smoking cessation  Will arrange outpatient follow-up in anticipation of discharge tomorrow. Await cardiac rehab evaluation. Will place order for nutrition consult for heart healthy/carb modified recommendations.    For questions or updates, please contact [place] Please consult [email or web address] for contact info under Cardiology/STEMI.      Signed, Abigail Butts, PA-C  08/10/2018, 11:06 AM   [phone]  I have seen, examined and evaluated  the patient this AM along with Abigail Butts, PA-C .  After reviewing all the available data and chart, we discussed the patients laboratory, study & physical findings as well as symptoms in detail. I agree with her findings, examination as well as impression recommendations as per our discussion.    Attending adjustments noted in italics.   Surprisingly stable this morning.  She had a very very good recovery and has had no further chest pain.  A little bit of dyspnea type symptoms may be from the Brilinta, but otherwise is feeling great. No arrhythmia on telemetry.  Exam relatively benign.  Tolerating beta-blocker.  Should be okay with ARB in addition  Based on the size of her infarct from troponin levels and the extent of thrombus burden I think is probably better to watch her 1 more day.  If stable I dissipate discharging  tomorrow.    Glenetta Hew, M.D., M.S. Interventional Cardiologist   Pager # [phone] Phone # [phone] [address] [place],  [address]

## 2018-08-10 NOTE — Discharge Instructions (Signed)
PLEASE REMEMBER TO BRING ALL OF YOUR MEDICATIONS TO EACH OF YOUR FOLLOW-UP OFFICE VISITS.  PLEASE ATTEND ALL SCHEDULED FOLLOW-UP APPOINTMENTS.   Activity: Increase activity slowly as tolerated. You may shower, but no soaking baths (or swimming) for 1 week. No driving for 24 hours. No lifting over 5 lbs for 1 week. No sexual activity for 1 week.   You May Return to Work: in 1 week (if applicable)  Wound Care: You may wash cath site gently with soap and water. Keep cath site clean and dry. If you notice pain, swelling, bleeding or pus at your cath site, please call [phone].  - - - - - - - - - - - - - - - - -  Information about your medication: Brilinta (anti-platelet agent)  Generic Name (Brand): ticagrelor (Brilinta), twice daily medication  PURPOSE: You are taking this medication along with aspirin to lower your chance of having a heart attack, stroke, or blood clots in your heart stent. These can be fatal. Brilinta and aspirin help prevent platelets from sticking together and forming a clot that can block an artery or your stent.   Common SIDE EFFECTS you may experience include: bruising or bleeding more easily, shortness of breath  Do not stop taking BRILINTA without talking to the doctor who prescribes it for you. People who are treated with a stent and stop taking Brilinta too soon, have a higher risk of getting a blood clot in the stent, having a heart attack, or dying. If you stop Brilinta because of bleeding, or for other reasons, your risk of a heart attack or stroke may increase.   Tell all of your doctors and dentists that you are taking Brilinta. They should talk to the doctor who prescribed Brilinta for you before you have any surgery or invasive procedure.   Contact your health care provider if you experience: severe or uncontrollable bleeding, pink/red/brown urine, vomiting blood or vomit that looks like "coffee grounds", red or black stools (looks like tar), coughing up  blood or blood clots ----------------------------------------------------------------------------------------------------------------------

## 2018-08-10 NOTE — Telephone Encounter (Signed)
New Message   Patient has a TOC appointment on 9/10

## 2018-08-11 DIAGNOSIS — E118 Type 2 diabetes mellitus with unspecified complications: Secondary | ICD-10-CM

## 2018-08-11 LAB — CBC
HEMATOCRIT: 36.5 % (ref 36.0–46.0)
Hemoglobin: 11.5 g/dL — ABNORMAL LOW (ref 12.0–15.0)
MCH: 26.6 pg (ref 26.0–34.0)
MCHC: 31.5 g/dL (ref 30.0–36.0)
MCV: 84.3 fL (ref 78.0–100.0)
Platelets: 223 10*3/uL (ref 150–400)
RBC: 4.33 MIL/uL (ref 3.87–5.11)
RDW: 14.5 % (ref 11.5–15.5)
WBC: 12.4 10*3/uL — AB (ref 4.0–10.5)

## 2018-08-11 LAB — GLUCOSE, CAPILLARY
Glucose-Capillary: 122 mg/dL — ABNORMAL HIGH (ref 70–99)
Glucose-Capillary: 86 mg/dL (ref 70–99)

## 2018-08-11 MED ORDER — LOSARTAN POTASSIUM 25 MG PO TABS: 25.0000 mg | ORAL_TABLET | Freq: Every day | ORAL | 11 refills | Status: DC

## 2018-08-11 MED ORDER — CARVEDILOL 6.25 MG PO TABS: 6.2500 mg | ORAL_TABLET | Freq: Two times a day (BID) | ORAL | 11 refills | Status: DC

## 2018-08-11 MED ORDER — NITROGLYCERIN 0.4 MG SL SUBL: 0.4000 mg | SUBLINGUAL_TABLET | SUBLINGUAL | 12 refills | Status: DC | PRN

## 2018-08-11 MED ORDER — ATORVASTATIN CALCIUM 80 MG PO TABS: 40.0000 mg | ORAL_TABLET | Freq: Every day | ORAL | 11 refills | Status: DC

## 2018-08-11 MED ORDER — TICAGRELOR 90 MG PO TABS: 90.0000 mg | ORAL_TABLET | Freq: Two times a day (BID) | ORAL | 0 refills | Status: DC

## 2018-08-11 MED ORDER — ASPIRIN 81 MG PO TBEC: 81.0000 mg | DELAYED_RELEASE_TABLET | Freq: Every day | ORAL | Status: DC

## 2018-08-11 MED ORDER — TICAGRELOR 90 MG PO TABS: 90.0000 mg | ORAL_TABLET | Freq: Two times a day (BID) | ORAL | 11 refills | Status: DC

## 2018-08-11 NOTE — Care Management Note (Signed)
Case Management Note  Patient Details  Name: Rebecca Cuevas MRN: [id number] Date of Birth: [birth year]  Subjective/Objective:  From home, s/p stent intervention, will be on brilinta, NCM spoke with charge RN, she will give patient the 30 day savings coupon card.  NCM spoke with patient and informed her on the 30 day free and the 5.00 copay card.  Informed her about the benefit check as well, but she should be able to use 5.00 co pay for refills.                  Action/Plan: DC home when ready.  Expected Discharge Date:  08/11/18               Expected Discharge Plan:  Home/Self Care  In-House Referral:     Discharge planning Services  CM Consult, Medication Assistance  Post Acute Care Choice:    Choice offered to:     DME Arranged:    DME Agency:     HH Arranged:    HH Agency:     Status of Service:  Completed, signed off  If discussed at [employer] of Stay Meetings, dates discussed:    Additional Comments:  Zenon Mayo, RN 08/11/2018, 12:38 PM

## 2018-08-11 NOTE — Telephone Encounter (Signed)
Current admit 08/11/18

## 2018-08-11 NOTE — Discharge Summary (Addendum)
Discharge Summary    Patient ID: Rebecca Cuevas,  MRN: [id number], DOB/AGE: 1961-04-05 57 y.o.  Admit date: 08/08/2018 Discharge date: 08/11/2018  Primary Care Provider: Donnamae Jude Primary Cardiologist: Glenetta Hew, MD  Discharge Diagnoses    Active Problems:   NSTEMI (non-ST elevated myocardial infarction) [facility])   Allergies Allergies  Allergen Reactions  . Contrast Media [Iodinated Diagnostic Agents] Hives, Shortness Of Breath and Itching    Pt broke out in large hives on her face after CT scan with contrast injection. Pt became slightly more short of breath than she was already after scan as well. Pt needs pre-meds prior to future studies.     Diagnostic Studies/Procedures    ECHO: 08/09/2018 - Left ventricle: The cavity size was normal. There was mild   concentric hypertrophy. Systolic function was vigorous. The   estimated ejection fraction was in the range of 65% to 70%. Wall   motion was normal; there were no regional wall motion   abnormalities. There was an increased relative contribution of   atrial contraction to ventricular filling. Doppler parameters are   consistent with abnormal left ventricular relaxation (grade 1   diastolic dysfunction). - Mitral valve: There was trivial regurgitation.   CARDIAC CATH: 08/09/2018  The left ventricular systolic function is normal.  LV end diastolic pressure is normal.  The left ventricular ejection fraction is 50-55% by visual estimate.  Prox RCA lesion is 99% stenosed.  Post intervention, there is a 0% residual stenosis.  A drug-eluting stent was successfully placed using a STENT SYNERGY DES 3.5X20.  Post Atrio lesion is 80% stenosed.  Prox Cx to Mid Cx lesion is 30% stenosed.  Prox LAD to Mid LAD lesion is 40% stenosed.   1.  Severe one-vessel coronary artery disease with subtotal thrombotic occlusion of the proximal right coronary artery with very large thrombus extending into the mid segment and  evidence of embolization into the posterior AV groove branch. 2.  Low normal LV systolic function with an EF of 50% with inferior wall hypokinesis.  Normal LVEDP. 3.  Successful aspiration thrombectomy and drug-eluting stent placement to the proximal right coronary artery.  Aspiration thrombectomy was also performed on the posterior AV groove branch.  Slow flow developed after stent placement which was treated with intracoronary adenosine given distally and proximally.  There was residual thrombus in the distal RCA distribution which was left to be treated with Aggrastat overnight.  Recommendations: Dual antiplatelet therapy for at least one year. Aggressive treatment of risk factors.  Recommend uninterrupted dual antiplatelet therapy with Aspirin 64m daily and Ticagrelor 954mtwice daily for a minimum of 12 months (ACS - Class I recommendation). Post-Intervention Diagram       _____________   History of Present Illness     89.o. female with PMH of tobacco use, HTN, HL and DM presented 8/28 with chest pain, thought it GERD. Found to have a positive troponin, [facility]   [facility]     Consultants: None  There was concern for PE, but a CT in the emergency room was negative.  She was started on IV heparin and nitro.  She continued to have chest pain and her troponin was elevated.  Cardiac catheterization was indicated and she was taken to the lab.  She had complicated PCI of her RCA with aspiration thrombectomy in the RCA and AV groove branch.  She tolerated the procedure well.  Her troponin went higher  after the procedure, probable distal embolization.  Her chest pain resolved after the cath and she felt much better.  She was started on a beta-blocker, and high-dose statin.  She was started on low-dose ARB as well.  She had been on HCTZ, this was discontinued.  She was seen by cardiac rehab and educated on MI restrictions, smoking cessation  and and exercise guidelines.  She is encouraged to follow-up with phase 2 as an outpatient.  Smoking cessation was discussed and encouraged.  On 8/30, she was seen by Dr. Ellyn Hack and all data were reviewed.  She was ambulating without difficulty.  She was having some atypical chest pain that was not at all like her presenting symptoms and was resolving spontaneously.  No further work-up.  Her vital signs were stable and her heart rate which had been tachycardic, was improved on the beta-blocker.  Dr. Ellyn Hack recommends considering Jardiance in the outpatient setting if her hemoglobin A1c becomes elevated.  Currently, it is 6.6, no med changes. _____________  Discharge Vitals Blood pressure 104/83, pulse 72, temperature 98.5 F (36.9 C), temperature source Oral, resp. rate 18, height '5\' 6"'  (1.676 m), weight 90.1 kg, SpO2 98 %.  Filed Weights   08/08/18 1919 08/09/18 0331  Weight: 91.6 kg 90.1 kg    Labs & Radiologic Studies    General appearance: alert, cooperative, appears stated age and no distress  Neck: no adenopathy, no carotid bruit and no JVD  Lungs: clear to auscultation bilaterally, normal percussion bilaterally and Nonlabored, good air movement  Heart: regular rate and rhythm, S1, S2 normal, no murmur, click, rub or gallop and Mild left parasternal tenderness to palpation.  Abdomen: soft, non-tender; bowel sounds normal; no masses, no organomegaly  Extremities: extremities normal, atraumatic, no cyanosis or edema  Pulses: 2+ and symmetric  Neurologic: Alert and oriented X 3, normal strength and tone. Normal symmetric reflexes. Normal coordination and gait   CBC Recent Labs    08/10/18 0351 08/11/18 0231  WBC 17.8* 12.4*  HGB 11.4* 11.5*  HCT 35.4* 36.5  MCV 83.1 84.3  PLT 243 323   Basic Metabolic Panel Recent Labs    08/09/18 0448 08/10/18 0351  NA 136 138  K 3.9 3.8  CL 101 107  CO2 20* 23  GLUCOSE 319* 196*  BUN 13 17  CREATININE 1.13* 1.06*  CALCIUM 9.4  8.9   Liver Function Tests Lab Results  Component Value Date   ALT 11 01/27/2017   AST 14 01/27/2017   ALKPHOS 79 01/27/2017   BILITOT 0.2 01/27/2017   Cardiac Enzymes Recent Labs    08/09/18 0448 08/09/18 1521 08/09/18 2015  TROPONINI 0.83* 13.19* 15.14*   Hemoglobin A1C Recent Labs    08/09/18 1403  HGBA1C 6.6*   Fasting Lipid Panel Recent Labs    08/09/18 1403  CHOL 223*  HDL 65  LDLCALC 149*  TRIG 44  CHOLHDL 3.4    _____________  Dg Chest 2 View  Result Date: 08/08/2018 CLINICAL DATA:  Anterior midline chest pain EXAM: CHEST - 2 VIEW COMPARISON:  08/06/2013 FINDINGS: Heart and mediastinal contours are within normal limits. No focal opacities or effusions. No acute bony abnormality. IMPRESSION: No active cardiopulmonary disease. Electronically Signed   By: Rolm Baptise M.D.   On: 08/08/2018 20:03   Ct Angio Chest Pe W/cm &/or Wo Cm  Result Date: 08/08/2018 CLINICAL DATA:  Chest pain and shortness of breath EXAM: CT ANGIOGRAPHY CHEST WITH CONTRAST TECHNIQUE: Multidetector CT imaging of the chest  was performed using the standard protocol during bolus administration of intravenous contrast. Multiplanar CT image reconstructions and MIPs were obtained to evaluate the vascular anatomy. CONTRAST:  183m ISOVUE-370 IOPAMIDOL (ISOVUE-370) INJECTION 76% COMPARISON:  None. FINDINGS: Cardiovascular: --Pulmonary arteries: Contrast injection is sufficient to demonstrate satisfactory opacification of the pulmonary arteries to the segmental level. There is no pulmonary embolus. The main pulmonary artery is within normal limits for size. --Aorta: Satisfactory opacification of the thoracic aorta. No aortic dissection or other acute aortic syndrome. Conventional 3 vessel aortic branching pattern. The aortic course and caliber are normal. There is no aortic atherosclerosis. --Heart: Normal size. No pericardial effusion. Mediastinum/Nodes: No mediastinal, hilar or axillary lymphadenopathy.  The visualized thyroid and thoracic esophageal course are unremarkable. Lungs/Pleura: No pulmonary nodules or masses. No pleural effusion or pneumothorax. No focal airspace consolidation. No focal pleural abnormality. Upper Abdomen: Contrast bolus timing is not optimized for evaluation of the abdominal organs. Within this limitation, the visualized organs of the upper abdomen are normal. Musculoskeletal: No chest wall abnormality. No acute or significant osseous findings. Review of the MIP images confirms the above findings. IMPRESSION: No pulmonary embolus or acute aortic syndrome. Electronically Signed   By: KUlyses JarredM.D.   On: 08/08/2018 22:28   Disposition   Pt is being discharged home today in good condition.  Follow-up Plans & Appointments    Follow-up Information    LLendon Colonel NP Follow up on 08/22/2018.   Specialties:  Nurse Practitioner, Radiology, Cardiology Why:  Please arrive 15 minutes early for your 9:30am post-hospital cardiology follow-up appointment [address] information[id number]         Discharge Instructions    Amb Referral to Cardiac Rehabilitation   Complete by:  As directed    Diagnosis:   Coronary Stents PTCA NSTEMI     Diet - low sodium heart healthy   Complete by:  As directed    Diabetic   Increase activity slowly   Complete by:  As directed       Discharge Medications   Allergies as of 08/11/2018      Reactions   Contrast Media [iodinated Diagnostic Agents] Hives, Shortness Of Breath, Itching   Pt broke out in large hives on her face after CT scan with contrast injection. Pt became slightly more short of breath than she was already after scan as well. Pt needs pre-meds prior to future studies.       Medication List    STOP taking these medications   hydrochlorothiazide 25 MG tablet Commonly known as:  HYDRODIURIL     TAKE these medications   alum & mag hydroxide-simeth  200-200-20 MG/5ML suspension Commonly known as:  MAALOX/MYLANTA Take by mouth every 6 (six) hours as needed for indigestion or heartburn.   aspirin 81 MG EC tablet Take 1 tablet (81 mg total) by mouth daily. Start taking on:  08/12/2018   atorvastatin 80 MG tablet Commonly known as:  LIPITOR Take 0.5 tablets (40 mg total) by mouth daily. What changed:  medication strength   BAYER CONTOUR NEXT MONITOR w/Device Kit 1 Device by Does not apply route 2 (two) times daily.   carvedilol 6.25 MG tablet Commonly known as:  COREG Take 1 tablet (6.25 mg total) by mouth 2 (two) times daily with a meal.   COMPLETE MULTIVITAMIN/MINERAL PO Take by mouth.   esomeprazole 20 MG capsule Commonly known as:  NEXIUM Take 1 capsule (20 mg total) by  mouth daily for 14 days.   glucose blood test strip Use as instructed   hydrocortisone cream 1 % APPLY ONE APPLICATION TOPICALLY ONCE DAILY AS NEEDED FOR ITCHING (FOR ECZEMA).   losartan 25 MG tablet Commonly known as:  COZAAR Take 1 tablet (25 mg total) by mouth daily. Start taking on:  08/12/2018   metFORMIN 500 MG tablet Commonly known as:  GLUCOPHAGE Take 1 tablet (500 mg total) by mouth 2 (two) times daily with a meal.   MICROLET NEXT LANCING DEVICE Misc 1 Device by Does not apply route 2 (two) times daily.   mupirocin ointment 2 % Commonly known as:  BACTROBAN Apply 1 application topically 2 (two) times daily.   nicotine 14 mg/24hr patch Commonly known as:  NICODERM CQ - dosed in mg/24 hours Place 1 patch (14 mg total) onto the skin daily.   nitroGLYCERIN 0.4 MG SL tablet Commonly known as:  NITROSTAT Place 1 tablet (0.4 mg total) under the tongue every 5 (five) minutes x 3 doses as needed for chest pain.   nystatin powder Commonly known as:  MYCOSTATIN/NYSTOP Apply topically 4 (four) times daily.   ondansetron 4 MG disintegrating tablet Commonly known as:  ZOFRAN-ODT Take 1 tablet (4 mg total) by mouth every 8 (eight) hours as  needed for nausea or vomiting.   ticagrelor 90 MG Tabs tablet Commonly known as:  BRILINTA Take 1 tablet (90 mg total) by mouth 2 (two) times daily.   vitamin B-12 500 MCG tablet Commonly known as:  CYANOCOBALAMIN Take 500 mcg by mouth daily.   vitamin C 500 MG tablet Commonly known as:  ASCORBIC ACID Take 500 mg by mouth daily.        Aspirin prescribed at discharge?  Yes High Intensity Statin Prescribed? (Lipitor 40-31m or Crestor 20-46m: Yes Beta Blocker Prescribed? Yes For EF <40%, was ACEI/ARB Prescribed? Yes ADP Receptor Inhibitor Prescribed? (i.e. Plavix etc.-Includes Medically Managed Patients): Yes For EF <40%, Aldosterone Inhibitor Prescribed? No: EF greater than 40% Was EF assessed during THIS hospitalization? Yes Was Cardiac Rehab II ordered? (Included Medically managed Patients): Yes   Outstanding Labs/Studies   None  Duration of Discharge Encounter   Greater than 30 minutes including physician time.  SiAlcario Droughtarrett NP 08/11/2018, 12:17 PM   Patient seen and examined this morning.  I agree with discharge summary.  Please see my brief brief progress note for full details. Did well after relatively significant RCA thrombotic occlusion with recurrent pain after initial stability from her initial non-STEMI.  She receives recovered well with no further symptoms.  Stable for discharge.   DaGlenetta HewM.D., M.S. Interventional Cardiologist   Pager # [phone] # [phone] [address] RoadSuCookrYosem[place] Lakes[place] [id number]

## 2018-08-11 NOTE — Progress Notes (Signed)
Discharged home by wheelchair accompanied by daughter, discharge instructions given to pt. Belongings taken home.

## 2018-08-11 NOTE — Progress Notes (Signed)
   Brief ATTENDING NOTE pre-discharge.  She overall feels much better.  Is ambulating the hallway without difficulty.  She is noticing some right calf burning sensation is probably more skin irritations, she is also noticing intermittent pinching sensation in her chest that is relatively rare but a new thing for her.  Does not seem at all like her anginal equivalent.  Otherwise exam very much unchanged BP 133/76 (BP Location: Right Arm)   Pulse 70   Temp 98.3 F (36.8 C) (Oral)   Resp 17   Ht 5\' 6"  (1.676 m)   Wt 90.1 kg   SpO2 98%   BMI 32.07 kg/m  General appearance: alert, cooperative, appears stated age and no distress Neck: no adenopathy, no carotid bruit and no JVD Lungs: clear to auscultation bilaterally, normal percussion bilaterally and Nonlabored, good air movement Heart: regular rate and rhythm, S1, S2 normal, no murmur, click, rub or gallop and Mild left parasternal tenderness to palpation. Abdomen: soft, non-tender; bowel sounds normal; no masses,  no organomegaly Extremities: extremities normal, atraumatic, no cyanosis or edema Pulses: 2+ and symmetric Neurologic: Alert and oriented X 3, normal strength and tone. Normal symmetric reflexes. Normal coordination and gait   Plan will be per yesterday's note of discharge today.  Is on stable regimen.  Would not necessarily titrate further.  Tolerating restarting losartan along with the dose of Coreg.  On high-dose atorvastatin. Consider Jardiance in the outpatient setting if A1c is elevated, currently at 6.6 probably would just simply continue metformin.  Okay for discharge today.  Glenetta Hew, MD

## 2018-08-11 NOTE — Care Management (Signed)
08-11-18  BENEFITS CHECK:  # 5.  S/W  JAZMINE @  [facility] # [phone]  BRILINTA   90 MG BID COVER- YES CO-PAY- $ 50.00 TIER- 3 DRUG PRIOR APPROVAL- NO  NO DEDUCTIBLE  PREFERRED PHARMACY : YES   [facility]

## 2018-08-11 NOTE — Progress Notes (Signed)
CARDIAC REHAB PHASE I   PRE:  Rate/Rhythm: 72 SR    BP: sitting 113/81    SaO2:   MODE:  Ambulation: 300 ft   POST:  Rate/Rhythm: 86 SR    BP: sitting 104/83     SaO2:   Pt has been walking independently over night. She is c/o right calf pain, thinks it might be from her pants rubbing. Limping some, slow walk today. Also sts she has had twinges of CP, brief. No questions. [phone]  [place], ACSM 08/11/2018 10:50 AM

## 2018-08-15 NOTE — Telephone Encounter (Signed)
Unable to reach pt or leave a message  

## 2018-08-16 NOTE — Telephone Encounter (Signed)
Patient contacted regarding discharge from [facility] on 08/11/18.  Patient understands to follow up with provider Andres Shad on 08/22/18 at 930am at [facility]. Patient understands discharge instructions? yes Patient understands medications and regiment? yes Patient understands to bring all medications to this visit? yes

## 2018-08-17 ENCOUNTER — Telehealth (HOSPITAL_COMMUNITY): Payer: Self-pay

## 2018-08-17 NOTE — Telephone Encounter (Signed)
Called patient to see if she is interested in the Cardiac Rehab Program. Patient expressed interest. Explained scheduling process and went over insurance, patient verbalized understanding. Will contact patient for scheduling once f/u has been completed. 

## 2018-08-17 NOTE — Telephone Encounter (Signed)
Pt insurance is active and benefits verified through [employer] $0.00, DED $2,000.00/$0.00 met, out of pocket $6,350.00/$0.00 met, co-insurance 30%. No pre-authorization. [employer], 08/17/18 @ 11:21AM, JSH#[id number]  Will contact patient to see if she is interested in the Cardiac Rehab Program. If interested, patient will need to complete follow up appt. Once completed, patient will be contacted for scheduling upon review by the RN Navigator.

## 2018-08-20 NOTE — Progress Notes (Signed)
Cardiology Office Note   Date:  08/22/2018   ID:  Rebecca Cuevas, DOB 03/11/1961, MRN [id number]  PCP:  Leonard Downing, MD  Cardiologist: Dr. Ellyn Hack Chief Complaint  Cuevas presents with  . Hospitalization Follow-up  . Coronary Artery Disease    s/p stent to RCA     History of Present Illness: Rebecca Cuevas is a 57 y.o. female who presents for posthospitalization follow-up after admission for non-ST elevation MI.  Rebecca Cuevas has a history of tobacco abuse, GERD, hypertension, hyperlipidemia, and type 2 diabetes.    Rebecca Cuevas had cardiac catheterization which revealed 99% proximal RCA stenosis which was successfully treated with a drug-eluting stent.  Rebecca Cuevas was found to have an low normal LV systolic function of 59% with inferior wall hypokinesis with normal LVEDP.  Rebecca Cuevas required aspiration thrombectomy during intervention.  There was higher troponin post procedure which was felt to be related to probable distal embolization.  On discharge, Rebecca Cuevas was started on dual antiplatelet therapy, carvedilol, continued on losartan.  She is here for TOC follow-up.  She is without complaints today.She is not longer on Nexium and has been transitioned to po ranitidine 150 mg daily. She denies complaints of chest pain, bleeding or dyspnea.  She does have some complaints of mild dizziness. .   Past Medical History:  Diagnosis Date  . Diabetes mellitus without complication ([place])   . Hypertension     Past Surgical History:  Procedure Laterality Date  . ABDOMINAL HYSTERECTOMY    . CESAREAN SECTION    . CORONARY STENT INTERVENTION N/A 08/09/2018   Procedure: CORONARY STENT INTERVENTION;  Surgeon: Wellington Hampshire, MD;  Location: [place] [facility];  Service: Cardiovascular;  Laterality: N/A;  . HERNIA REPAIR    . LEFT HEART CATH AND CORONARY ANGIOGRAPHY N/A 08/09/2018   Procedure: LEFT HEART CATH AND CORONARY ANGIOGRAPHY;  Surgeon: Wellington Hampshire, MD;  Location:  [place] [place] [facility];  Service: Cardiovascular;  Laterality: N/A;     Current Outpatient Medications  Medication Sig Dispense Refill  . alum & mag hydroxide-simeth (MAALOX/MYLANTA) 200-200-20 MG/5ML suspension Take by mouth every 6 (six) hours as needed for indigestion or heartburn.    Marland Kitchen aspirin EC 81 MG EC tablet Take 1 tablet (81 mg total) by mouth daily.    Marland Kitchen atorvastatin (LIPITOR) 80 MG tablet Take 0.5 tablets (40 mg total) by mouth daily. 30 tablet 11  . Blood Glucose Monitoring Suppl (BAYER CONTOUR NEXT MONITOR) w/Device KIT 1 Device by Does not apply route 2 (two) times daily. 1 kit 0  . carvedilol (COREG) 6.25 MG tablet Take 1 tablet (6.25 mg total) by mouth 2 (two) times daily with a meal. 60 tablet 11  . glucose blood (BAYER CONTOUR NEXT TEST) test strip Use as instructed 100 each 12  . hydrocortisone cream 1 % APPLY ONE APPLICATION TOPICALLY ONCE DAILY AS NEEDED FOR ITCHING (FOR ECZEMA). 60 g 3  . Lancet Devices (MICROLET NEXT LANCING DEVICE) MISC 1 Device by Does not apply route 2 (two) times daily. 1 each 1  . metFORMIN (GLUCOPHAGE) 500 MG tablet Take 1 tablet (500 mg total) by mouth 2 (two) times daily with a meal. 180 tablet 3  . Multiple Vitamins-Minerals (COMPLETE MULTIVITAMIN/MINERAL PO) Take by mouth.    . mupirocin ointment (BACTROBAN) 2 % Apply 1 application topically 2 (two) times daily. 22 g 0  . nicotine (NICODERM CQ) 14 mg/24hr patch Place 1 patch (14 mg total) onto Rebecca skin daily.  28 patch 0  . nitroGLYCERIN (NITROSTAT) 0.4 MG SL tablet Place 1 tablet (0.4 mg total) under Rebecca tongue every 5 (five) minutes x 3 doses as needed for chest pain. 25 tablet 12  . nystatin (MYCOSTATIN/NYSTOP) powder Apply topically 4 (four) times daily. 15 g 0  . ondansetron (ZOFRAN ODT) 4 MG disintegrating tablet Take 1 tablet (4 mg total) by mouth every 8 (eight) hours as needed for nausea or vomiting. 10 tablet 0  . ranitidine (ZANTAC) 150 MG tablet Take 150 mg by mouth daily.    . ticagrelor  (BRILINTA) 90 MG TABS tablet Take 1 tablet (90 mg total) by mouth 2 (two) times daily. 60 tablet 11  . vitamin B-12 (CYANOCOBALAMIN) 500 MCG tablet Take 500 mcg by mouth daily.    . vitamin C (ASCORBIC ACID) 500 MG tablet Take 500 mg by mouth daily.     No current facility-administered medications for this visit.     Allergies:   Contrast media [iodinated diagnostic agents]    Social History:  Rebecca Cuevas  reports that she has been smoking. She has been smoking about 0.50 packs per day. She has never used smokeless tobacco. She reports that she drinks alcohol. She reports that she does not use drugs.   Family History:  Rebecca Cuevas's family history includes Diabetes in her father; Hypertension in her mother.    ROS: All other systems are reviewed and negative. Unless otherwise mentioned in H&P    PHYSICAL EXAM: VS:  BP 90/60   Pulse 71   Ht '5\' 6"'  (1.676 m)   Wt 195 lb 12.8 oz (88.8 kg)   BMI 31.60 kg/m  , BMI Body mass index is 31.6 kg/m. GEN: Well nourished, well developed, in no acute distress  HEENT: normal  Neck: no JVD, carotid bruits, or masses Cardiac: RRR; no murmurs, rubs, or gallops,no edema  Respiratory:  clear to auscultation bilaterally, normal work of breathing GI: soft, nontender, nondistended, + BS MS: no deformity or atrophy  Skin: warm and dry, no rash Neuro:  Strength and sensation are intact Psych: euthymic mood, full affect   EKG:  NSR rate of 71 bpm. Inferior infarct.   Recent Labs: 08/10/2018: BUN 17; Creatinine, Ser 1.06; Potassium 3.8; Sodium 138 08/11/2018: Hemoglobin 11.5; Platelets 223    Lipid Panel    Component Value Date/Time   CHOL 223 (H) 08/09/2018 1403   TRIG 44 08/09/2018 1403   HDL 65 08/09/2018 1403   CHOLHDL 3.4 08/09/2018 1403   VLDL 9 08/09/2018 1403   LDLCALC 149 (H) 08/09/2018 1403      Wt Readings from Last 3 Encounters:  08/22/18 195 lb 12.8 oz (88.8 kg)  08/09/18 198 lb 11.2 oz (90.1 kg)  05/18/18 203 lb (92.1 kg)        Other studies Reviewed: ECHO: 08/09/2018 - Left ventricle: Rebecca cavity size was normal. There was mild concentric hypertrophy. Systolic function was vigorous. Rebecca estimated ejection fraction was in Rebecca range of 65% to 70%. Wall motion was normal; there were no regional wall motion abnormalities. There was an increased relative contribution of atrial contraction to ventricular filling. Doppler parameters are consistent with abnormal left ventricular relaxation (grade 1 diastolic dysfunction). - Mitral valve: There was trivial regurgitation.   CARDIAC CATH: 08/09/2018  Rebecca left ventricular systolic function is normal.  LV end diastolic pressure is normal.  Rebecca left ventricular ejection fraction is 50-55% by visual estimate.  Prox RCA lesion is 99% stenosed.  Post intervention, there  is a 0% residual stenosis.  A drug-eluting stent was successfully placed using a STENT SYNERGY DES 3.5X20.  Post Atrio lesion is 80% stenosed.  Prox Cx to Mid Cx lesion is 30% stenosed.  Prox LAD to Mid LAD lesion is 40% stenosed.  1. Severe one-vessel coronary artery disease with subtotal thrombotic occlusion of Rebecca proximal right coronary artery with very large thrombus extending into Rebecca mid segment and evidence of embolization into Rebecca posterior AV groove branch. 2. Low normal LV systolic function with an EF of 50% with inferior wall hypokinesis. Normal LVEDP. 3. Successful aspiration thrombectomy and drug-eluting stent placement to Rebecca proximal right coronary artery. Aspiration thrombectomy was also performed on Rebecca posterior AV groove branch. Slow flow developed after stent placement which was treated with intracoronary adenosine given distally and proximally. There was residual thrombus in Rebecca distal RCA distribution which was left to be treated with Aggrastat overnight.  Recommendations: Dual antiplatelet therapy for at least one year. Aggressive treatment of  risk factors.  Recommend uninterrupted dual antiplatelet therapy with Aspirin 61m daily and Ticagrelor 932mtwice dailyfor a minimum of 12 months (ACS - Class I recommendation).  ASSESSMENT AND PLAN:  1. CAD: S/P stent to Rebecca RCA due to 99% stenosis, reduced to 0%. She is doing well and tolerating Rebecca DAPT without bleeding issues or recurrence of symptoms.  Continue carvedilol. She is referred to cardiac rehab.   2. Hypertension: She is hypotensive today. I have rechecked her BP in both arms and she remains hypotensive with BP 90/60 in Rebecca left arm and 84/55 in Rebecca right arm. I will stop losartan today. She is to take BP BID at home. This may be a cumulative affect with coreg taken at Rebecca same time. May need to adjust dose or have her take it later in Rebecca day. For now, however, stop and follow up in 2 weeks. 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    3. Hypercholesterolemia: Continue statin therapy with atorvastatin 40 mg daily. She will need follow up lipids in 6 weeks with goal of LDL ,70.   4.Tobacco abuse: She has stopped smoking since hospitalization. She is congratulated on this lifestyle change   Current medicines are reviewed at length with Rebecca Cuevas today.  I have reviewed her cardia cath results with her and provided her a copy.   Labs/ tests ordered today include: BMET and CBC.  KaPhill MyronLaWest PughANP, AACC   08/22/2018 12:52 PM    [facility] [address] [address]one: [phone]: [phone]

## 2018-08-22 ENCOUNTER — Ambulatory Visit (INDEPENDENT_AMBULATORY_CARE_PROVIDER_SITE_OTHER): Payer: PRIVATE HEALTH INSURANCE | Admitting: Adult Health

## 2018-08-22 ENCOUNTER — Encounter: Payer: Self-pay | Admitting: Adult Health

## 2018-08-22 VITALS — BP 90/60 | HR 71 | Ht 66.0 in | Wt 195.8 lb

## 2018-08-22 DIAGNOSIS — I2510 Atherosclerotic heart disease of native coronary artery without angina pectoris: Secondary | ICD-10-CM | POA: Diagnosis not present

## 2018-08-22 DIAGNOSIS — I214 Non-ST elevation (NSTEMI) myocardial infarction: Secondary | ICD-10-CM

## 2018-08-22 DIAGNOSIS — I10 Essential (primary) hypertension: Secondary | ICD-10-CM | POA: Diagnosis not present

## 2018-08-22 DIAGNOSIS — Z79899 Other long term (current) drug therapy: Secondary | ICD-10-CM | POA: Diagnosis not present

## 2018-08-22 DIAGNOSIS — E7800 Pure hypercholesterolemia, unspecified: Secondary | ICD-10-CM

## 2018-08-22 NOTE — Patient Instructions (Addendum)
Medication Instructions:   STOP losartan  Labwork:  BMET/CBC today  Testing/Procedures:  NONE  Follow-Up:  It is recommended that you schedule a follow-up appointment in Monday September 23 @ 11am   If you need a refill on your cardiac medications before your next appointment, please call your pharmacy.  Any Other Special Instructions Will Be Listed Below (If Applicable).  Per Curt Bears NP, you are OK to proceed with cardiac rehab  Please monitor home BP twice daily - same time each day.  If your BP is greater than 140, please call our office.

## 2018-08-23 ENCOUNTER — Encounter (HOSPITAL_COMMUNITY): Payer: Self-pay

## 2018-08-23 LAB — BASIC METABOLIC PANEL
BUN/Creatinine Ratio: 10 (ref 9–23)
BUN: 9 mg/dL (ref 6–24)
CHLORIDE: 105 mmol/L (ref 96–106)
CO2: 20 mmol/L (ref 20–29)
Calcium: 9.2 mg/dL (ref 8.7–10.2)
Creatinine, Ser: 0.88 mg/dL (ref 0.57–1.00)
GFR calc non Af Amer: 73 mL/min/{1.73_m2} (ref >59)
GFR, EST AFRICAN AMERICAN: 84 mL/min/{1.73_m2} (ref >59)
GLUCOSE: 70 mg/dL (ref 65–99)
Potassium: 4.3 mmol/L (ref 3.5–5.2)
Sodium: 141 mmol/L (ref 134–144)

## 2018-08-23 LAB — CBC
Hematocrit: 34.8 % (ref 34.0–46.6)
Hemoglobin: 11.5 g/dL (ref 11.1–15.9)
MCH: 26.9 pg (ref 26.6–33.0)
MCHC: 33 g/dL (ref 31.5–35.7)
MCV: 82 fL (ref 79–97)
PLATELETS: 305 10*3/uL (ref 150–450)
RBC: 4.27 x10E6/uL (ref 3.77–5.28)
RDW: 14.5 % (ref 12.3–15.4)
WBC: 8.8 10*3/uL (ref 3.4–10.8)

## 2018-08-23 NOTE — Telephone Encounter (Signed)
Attempted to call patient in regards to CR, unable to LM - mailbox full.  Mailed letter

## 2018-08-23 NOTE — Telephone Encounter (Signed)
Patient returned call, will come in for CR orientation on 10/17/18 @ 1:30PM and will exercise in the 2:45PM class.  Mailed homework package.

## 2018-08-24 ENCOUNTER — Telehealth: Payer: Self-pay | Admitting: Physician Assistant

## 2018-08-24 NOTE — Telephone Encounter (Signed)
The patient called the answering service after-hours today. Recent OV reviewed, losartan stopped due to hypotension. She called because she was told to notify office if BP begins running higher. She does affirm she's had her BP cuff calibrated before, and it was concordant with office BPs before. It was 130/86 this AM and now 148/100 this PM. She confirms she is still taking carvedilol. She restarted losartan 1/2 tablet this PM about 15 minutes ago. I told her that I would agree with her continuing to take the losartan 12.5mg  daily if SBP>130 and I will forward to [facility] for further review.  The patient verbalized understanding and gratitude.  Will also route to triage to call pt tomorrow to see how BP is running. If her BP continues to run >130 despite losartan 12.5mg  daily she should be instructed to resume the whole tablet (25mg ) as long as she is feeling well otherwise. Dayna Dunn PA-C

## 2018-08-25 NOTE — Telephone Encounter (Signed)
Thank you, Dayna.

## 2018-08-25 NOTE — Telephone Encounter (Signed)
Spoke to patient she stated she was feeling better this morning.Stated her B/P at 9:00 am 119/74.Advised continue to monitor B/P and bring readings to appointment with Jory Sims NP 09/04/18 at 11:00 am.Advised to call sooner if needed.

## 2018-09-02 NOTE — Progress Notes (Signed)
Cardiology Office Note   Date:  09/04/2018   ID:  Rebecca Cuevas, DOB 02-Jul-1961, MRN [id number]  PCP:  Leonard Downing, MD  Cardiologist: Dr. Ellyn Hack  Chief Complaint  Patient presents with  . Coronary Artery Disease  . Hypertension  . Hyperlipidemia     History of Present Illness: Rebecca Cuevas is a 57 y.o. female who presents for ongoing assessment and management of coronary artery disease, ongoing tobacco abuse, hypertension, hyperlipidemia, with history of type 2 diabetes and GERD.  Patient underwent cardiac catheterization on 08/09/2018, which revealed a 99% proximal RCA stenosis which was successfully treated with a drug-eluting stent.  The patient required aspiration thrombectomy during intervention.  The patient had a troponin elevation post procedure which was felt to be related to probable distal embolization.  LVEF per cath revealed a 50% with inferior wall hypokinesis and normal LVEDP.  Patient was last seen in the office on 08/22/2018, was without complaints, had been started on ranitidine 150 mg daily.  She did have some complaints of dizziness and was found to be hypotensive during office visit.  Blood pressures ranging from 90/60-80 4/55.  Losartan was discontinued and she was advised to take her blood pressure twice a day at home.  There was concern that she may have a cumulative effect with taking Coreg at the same time as losartan.  She was advised not to return to work until blood pressure was normalized as she works as a [employer] manager.  She called our office on 08/24/2018 as her blood pressure was running much higher based upon her home recordings.  130/80 6 in the morning and 148/100 in the p.m.  She spoke with Melina Copa, PA, and was restarted on losartan 12.5 mg.  If blood pressure continued to be elevated she was to return to the full dose of 25 mg daily.  She is here for follow-up to evaluate BP on medication management.  She brings with her a copy of her BP readings.  She has figured out that the reason that she had higher BP was that she was smoking at the time she took her BP. She stopped smoking and her BP went down. She smoked once more to check on BP response, noted that she had higher BP reading. She therefore did not take losartan and she has completely stopped smoking. BP has been within normal range since that time.   She requires FMLA paperwork to be filled out and a letter to return to work.   Past Medical History:  Diagnosis Date  . Diabetes mellitus without complication ([place])   . Hypertension     Past Surgical History:  Procedure Laterality Date  . ABDOMINAL HYSTERECTOMY    . CESAREAN SECTION    . CORONARY STENT INTERVENTION N/A 08/09/2018   Procedure: CORONARY STENT INTERVENTION;  Surgeon: Wellington Hampshire, MD;  Location: [place] [facility];  Service: Cardiovascular;  Laterality: N/A;  . HERNIA REPAIR    . LEFT HEART CATH AND CORONARY ANGIOGRAPHY N/A 08/09/2018   Procedure: LEFT HEART CATH AND CORONARY ANGIOGRAPHY;  Surgeon: Wellington Hampshire, MD;  Location: [place] [facility];  Service: Cardiovascular;  Laterality: N/A;     Current Outpatient Medications  Medication Sig Dispense Refill  . alum & mag hydroxide-simeth (MAALOX/MYLANTA) 200-200-20 MG/5ML suspension Take by mouth every 6 (six) hours as needed for indigestion or heartburn.    Marland Kitchen aspirin EC 81 MG EC tablet Take 1 tablet (81 mg total) by mouth daily.    Marland Kitchen  atorvastatin (LIPITOR) 80 MG tablet Take 0.5 tablets (40 mg total) by mouth daily. 30 tablet 11  . Blood Glucose Monitoring Suppl (BAYER CONTOUR NEXT MONITOR) w/Device KIT 1 Device by Does not apply route 2 (two) times daily. 1 kit 0  . carvedilol (COREG) 6.25 MG tablet Take 1 tablet (6.25 mg total) by mouth 2 (two) times daily with a meal. 60 tablet 11  . glucose blood (BAYER CONTOUR NEXT TEST) test strip Use as instructed 100 each 12  . hydrocortisone cream 1 % APPLY ONE APPLICATION TOPICALLY ONCE DAILY AS NEEDED FOR ITCHING  (FOR ECZEMA). 60 g 3  . Lancet Devices (MICROLET NEXT LANCING DEVICE) MISC 1 Device by Does not apply route 2 (two) times daily. 1 each 1  . metFORMIN (GLUCOPHAGE) 500 MG tablet Take 1 tablet (500 mg total) by mouth 2 (two) times daily with a meal. 180 tablet 3  . Multiple Vitamins-Minerals (COMPLETE MULTIVITAMIN/MINERAL PO) Take by mouth.    . mupirocin ointment (BACTROBAN) 2 % Apply 1 application topically 2 (two) times daily. 22 g 0  . nitroGLYCERIN (NITROSTAT) 0.4 MG SL tablet Place 1 tablet (0.4 mg total) under the tongue every 5 (five) minutes x 3 doses as needed for chest pain. 25 tablet 12  . nystatin (MYCOSTATIN/NYSTOP) powder Apply topically 4 (four) times daily. 15 g 0  . ranitidine (ZANTAC) 150 MG tablet Take 150 mg by mouth daily.    . ticagrelor (BRILINTA) 90 MG TABS tablet Take 1 tablet (90 mg total) by mouth 2 (two) times daily. 60 tablet 11  . vitamin B-12 (CYANOCOBALAMIN) 500 MCG tablet Take 500 mcg by mouth daily.    . vitamin C (ASCORBIC ACID) 500 MG tablet Take 500 mg by mouth daily.     No current facility-administered medications for this visit.     Allergies:   Contrast media [iodinated diagnostic agents]    Social History:  The patient  reports that she has been smoking. She has been smoking about 0.50 packs per day. She has never used smokeless tobacco. She reports that she drinks alcohol. She reports that she does not use drugs.   Family History:  The patient's family history includes Diabetes in her father; Hypertension in her mother.    ROS: All other systems are reviewed and negative. Unless otherwise mentioned in H&P    PHYSICAL EXAM: VS:  BP 107/75   Pulse 77   Ht '5\' 6"'  (1.676 m)   Wt 194 lb 12.8 oz (88.4 kg)   SpO2 97%   BMI 31.44 kg/m  , BMI Body mass index is 31.44 kg/m. GEN: Well nourished, well developed, in no acute distress  HEENT: normal  Neck: no JVD, carotid bruits, or masses Cardiac: RRR; no murmurs, rubs, or gallops,no edema    Respiratory:  clear to auscultation bilaterally, normal work of breathing GI: soft, nontender, nondistended, + BS MS: no deformity or atrophy  Skin: warm and dry, no rash Neuro:  Strength and sensation are intact Psych: euthymic mood, full affect   EKG:  Not completed in clinic today.   Recent Labs: 08/22/2018: BUN 9; Creatinine, Ser 0.88; Hemoglobin 11.5; Platelets 305; Potassium 4.3; Sodium 141    Lipid Panel    Component Value Date/Time   CHOL 223 (H) 08/09/2018 1403   TRIG 44 08/09/2018 1403   HDL 65 08/09/2018 1403   CHOLHDL 3.4 08/09/2018 1403   VLDL 9 08/09/2018 1403   LDLCALC 149 (H) 08/09/2018 1403      Wt  Readings from Last 3 Encounters:  09/04/18 194 lb 12.8 oz (88.4 kg)  08/22/18 195 lb 12.8 oz (88.8 kg)  08/09/18 198 lb 11.2 oz (90.1 kg)      Other studies Reviewed: ECHO: 08/09/2018 - Left ventricle: The cavity size was normal. There was mild concentric hypertrophy. Systolic function was vigorous. The estimated ejection fraction was in the range of 65% to 70%. Wall motion was normal; there were no regional wall motion abnormalities. There was an increased relative contribution of atrial contraction to ventricular filling. Doppler parameters are consistent with abnormal left ventricular relaxation (grade 1 diastolic dysfunction). - Mitral valve: There was trivial regurgitation.   CARDIAC CATH: 08/09/2018  The left ventricular systolic function is normal.  LV end diastolic pressure is normal.  The left ventricular ejection fraction is 50-55% by visual estimate.  Prox RCA lesion is 99% stenosed.  Post intervention, there is a 0% residual stenosis.  A drug-eluting stent was successfully placed using a STENT SYNERGY DES 3.5X20.  Post Atrio lesion is 80% stenosed.  Prox Cx to Mid Cx lesion is 30% stenosed.  Prox LAD to Mid LAD lesion is 40% stenosed.  1. Severe one-vessel coronary artery disease with subtotal thrombotic  occlusion of the proximal right coronary artery with very large thrombus extending into the mid segment and evidence of embolization into the posterior AV groove branch. 2. Low normal LV systolic function with an EF of 50% with inferior wall hypokinesis. Normal LVEDP. 3. Successful aspiration thrombectomy and drug-eluting stent placement to the proximal right coronary artery. Aspiration thrombectomy was also performed on the posterior AV groove branch. Slow flow developed after stent placement which was treated with intracoronary adenosine given distally and proximally. There was residual thrombus in the distal RCA distribution which was left to be treated with Aggrastat overnight.  Recommendations: Dual antiplatelet therapy for at least one year. Aggressive treatment of risk factors.  Recommend uninterrupted dual antiplatelet therapy with Aspirin 75m daily and Ticagrelor 949mtwice dailyfor a minimum of 12 months (ACS - Class I recommendation).  ASSESSMENT AND PLAN:  1. CAD: S/P cardiac cath on 08/09/2018, with 99% proximal RCA stenosis, requiring a DES. She continues om DAPT with Brilinta and ASA. She offers no complaints of bleeding or dyspnea. She remains on coreg and atorvastatin. She is going to participate in cardiac rehab beginning in November 2019.   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   2. Hypertension: She noticed that her BP went up when she smoked. She stopped smoking and BP normalized. She did not take the losartan dose once BP normalized. Will keep her off of this for now.   3. Hyperlipidemia: Continue statin therapy.   4. GERD: Continue ranitidine for this, as medication has been helpful to her.    Current medicines are reviewed at length with the patient today.    Labs/ tests ordered today include: None   KaPhill MyronLaWest PughANP, AACC   09/04/2018 11:00 AM    [facility] [address] [place] [address]one: [phone]: [phone]

## 2018-09-04 ENCOUNTER — Encounter: Payer: Self-pay | Admitting: Adult Health

## 2018-09-04 ENCOUNTER — Ambulatory Visit (INDEPENDENT_AMBULATORY_CARE_PROVIDER_SITE_OTHER): Payer: PRIVATE HEALTH INSURANCE | Admitting: Adult Health

## 2018-09-04 VITALS — BP 107/75 | HR 77 | Ht 66.0 in | Wt 194.8 lb

## 2018-09-04 DIAGNOSIS — E7800 Pure hypercholesterolemia, unspecified: Secondary | ICD-10-CM | POA: Diagnosis not present

## 2018-09-04 DIAGNOSIS — I2510 Atherosclerotic heart disease of native coronary artery without angina pectoris: Secondary | ICD-10-CM | POA: Diagnosis not present

## 2018-09-04 DIAGNOSIS — Z720 Tobacco use: Secondary | ICD-10-CM

## 2018-09-04 DIAGNOSIS — I10 Essential (primary) hypertension: Secondary | ICD-10-CM | POA: Diagnosis not present

## 2018-09-04 NOTE — Patient Instructions (Signed)
Medication Instructions:  NO CHANGES- Your physician recommends that you continue on your current medications as directed. Please refer to the Current Medication list given to you today.  If you need a refill on your cardiac medications before your next appointment, please call your pharmacy.  Special Instructions: OK TO RETURN TO WORK-FULL DUTY  Follow-Up: Your physician wants you to follow-up in: [place] should receive a reminder letter in the mail two months in advance. If you do not receive a letter, please call our office in JAN 2020 to schedule your [facility] 2020 follow-up appointment.   Thank you for choosing [facility] at [facility]!!

## 2018-10-16 NOTE — Progress Notes (Signed)
Rebecca Cuevas 57 y.o. female DOB [birth year] MRN [id number]       Nutrition  No diagnosis found. Past Medical History:  Diagnosis Date  . Diabetes mellitus without complication ([place])   . Hypertension    Meds reviewed.    Current Outpatient Medications (Endocrine & Metabolic):  .  metFORMIN (GLUCOPHAGE) 500 MG tablet, Take 1 tablet (500 mg total) by mouth 2 (two) times daily with a meal.  Current Outpatient Medications (Cardiovascular):  .  atorvastatin (LIPITOR) 80 MG tablet, Take 0.5 tablets (40 mg total) by mouth daily. .  carvedilol (COREG) 6.25 MG tablet, Take 1 tablet (6.25 mg total) by mouth 2 (two) times daily with a meal. .  nitroGLYCERIN (NITROSTAT) 0.4 MG SL tablet, Place 1 tablet (0.4 mg total) under the tongue every 5 (five) minutes x 3 doses as needed for chest pain.   Current Outpatient Medications (Analgesics):  .  aspirin EC 81 MG EC tablet, Take 1 tablet (81 mg total) by mouth daily.  Current Outpatient Medications (Hematological):  .  ticagrelor (BRILINTA) 90 MG TABS tablet, Take 1 tablet (90 mg total) by mouth 2 (two) times daily. .  vitamin B-12 (CYANOCOBALAMIN) 500 MCG tablet, Take 500 mcg by mouth daily.  Current Outpatient Medications (Other):  .  alum & mag hydroxide-simeth (MAALOX/MYLANTA) 200-200-20 MG/5ML suspension, Take by mouth every 6 (six) hours as needed for indigestion or heartburn. .  Blood Glucose Monitoring Suppl (BAYER CONTOUR NEXT MONITOR) w/Device KIT, 1 Device by Does not apply route 2 (two) times daily. Marland Kitchen  glucose blood (BAYER CONTOUR NEXT TEST) test strip, Use as instructed .  hydrocortisone cream 1 %, APPLY ONE APPLICATION TOPICALLY ONCE DAILY AS NEEDED FOR ITCHING (FOR ECZEMA). Elmore Guise Devices (MICROLET NEXT LANCING DEVICE) MISC, 1 Device by Does not apply route 2 (two) times daily. .  Multiple Vitamins-Minerals (COMPLETE MULTIVITAMIN/MINERAL PO), Take by mouth. .  mupirocin ointment (BACTROBAN) 2 %, Apply 1 application topically 2  (two) times daily. Marland Kitchen  nystatin (MYCOSTATIN/NYSTOP) powder, Apply topically 4 (four) times daily. .  ranitidine (ZANTAC) 150 MG tablet, Take 150 mg by mouth daily. .  vitamin C (ASCORBIC ACID) 500 MG tablet, Take 500 mg by mouth daily.   HT: Ht Readings from Last 1 Encounters:  09/04/18 _0  (1.676 m)    WT: Wt Readings from Last 5 Encounters:  09/04/18 194 lb 12.8 oz (88.4 kg)  08/22/18 195 lb 12.8 oz (88.8 kg)  08/09/18 198 lb 11.2 oz (90.1 kg)  05/18/18 203 lb (92.1 kg)  01/25/18 203 lb (92.1 kg)     BMI = 31.46 (09/04/18)  Current tobacco use? yes       Labs:  Lipid Panel     Component Value Date/Time   CHOL 223 (H) 08/09/2018 1403   TRIG 44 08/09/2018 1403   HDL 65 08/09/2018 1403   CHOLHDL 3.4 08/09/2018 1403   VLDL 9 08/09/2018 1403   LDLCALC 149 (H) 08/09/2018 1403    Lab Results  Component Value Date   HGBA1C 6.6 (H) 08/09/2018   CBG (last 3)  No results for input(s): GLUCAP in the last 72 hours.  Nutrition Diagnosis ? Food-and nutrition-related knowledge deficit related to lack of exposure to information as related to diagnosis of: ? CVD ? Type 2 Diabetes ? Obese  I = 30-34.9 related to excessive energy intake as evidenced by a BMI 31.46  Nutrition Goal(s):  ? To be determined  Plan:  Pt to attend nutrition classes ? Nutrition  I ? Nutrition II ? Portion Distortion  ? Diabetes Blitz ? Diabetes Q & A Will provide client-centered nutrition education as part of interdisciplinary care.   Monitor and evaluate progress toward nutrition goal with team.  Laurina Bustle, MS, RD, LDN 10/16/2018 10:40 AM

## 2018-10-17 ENCOUNTER — Inpatient Hospital Stay (HOSPITAL_COMMUNITY)
Admission: RE | Admit: 2018-10-17 | Discharge: 2018-10-17 | Disposition: A | Discharge status: Home / Self Care | Payer: PRIVATE HEALTH INSURANCE | Source: Ambulatory Visit

## 2018-10-23 ENCOUNTER — Ambulatory Visit (HOSPITAL_COMMUNITY): Payer: PRIVATE HEALTH INSURANCE

## 2018-10-25 ENCOUNTER — Ambulatory Visit (HOSPITAL_COMMUNITY): Payer: PRIVATE HEALTH INSURANCE

## 2018-10-27 ENCOUNTER — Ambulatory Visit (HOSPITAL_COMMUNITY): Payer: PRIVATE HEALTH INSURANCE

## 2018-10-30 ENCOUNTER — Ambulatory Visit (HOSPITAL_COMMUNITY): Payer: PRIVATE HEALTH INSURANCE

## 2018-11-01 ENCOUNTER — Ambulatory Visit (HOSPITAL_COMMUNITY): Payer: PRIVATE HEALTH INSURANCE

## 2018-11-03 ENCOUNTER — Ambulatory Visit (HOSPITAL_COMMUNITY): Payer: PRIVATE HEALTH INSURANCE

## 2018-11-06 ENCOUNTER — Ambulatory Visit (HOSPITAL_COMMUNITY): Payer: PRIVATE HEALTH INSURANCE

## 2018-11-08 ENCOUNTER — Ambulatory Visit (HOSPITAL_COMMUNITY): Payer: PRIVATE HEALTH INSURANCE

## 2018-11-10 ENCOUNTER — Ambulatory Visit (HOSPITAL_COMMUNITY): Payer: PRIVATE HEALTH INSURANCE

## 2018-11-13 ENCOUNTER — Ambulatory Visit (HOSPITAL_COMMUNITY): Payer: PRIVATE HEALTH INSURANCE

## 2018-11-15 ENCOUNTER — Ambulatory Visit (HOSPITAL_COMMUNITY): Payer: PRIVATE HEALTH INSURANCE

## 2018-11-15 ENCOUNTER — Telehealth (HOSPITAL_COMMUNITY): Payer: Self-pay

## 2018-11-15 NOTE — Telephone Encounter (Signed)
Pt returned CR phone call, patient will come in for orientation on 01/04/19 @ 1:30PM and will attend the 2:45PM exercise class.

## 2018-11-15 NOTE — Telephone Encounter (Signed)
Attempted to contact pt in regards to her CR orientation appt. Unable to leave VM full.

## 2018-11-17 ENCOUNTER — Ambulatory Visit (HOSPITAL_COMMUNITY): Payer: PRIVATE HEALTH INSURANCE

## 2018-11-20 ENCOUNTER — Ambulatory Visit (HOSPITAL_COMMUNITY): Payer: PRIVATE HEALTH INSURANCE

## 2018-11-22 ENCOUNTER — Ambulatory Visit (HOSPITAL_COMMUNITY): Payer: PRIVATE HEALTH INSURANCE

## 2018-11-24 ENCOUNTER — Ambulatory Visit (HOSPITAL_COMMUNITY): Payer: PRIVATE HEALTH INSURANCE

## 2018-11-27 ENCOUNTER — Ambulatory Visit (HOSPITAL_COMMUNITY): Payer: PRIVATE HEALTH INSURANCE

## 2018-11-29 ENCOUNTER — Ambulatory Visit (HOSPITAL_COMMUNITY): Payer: PRIVATE HEALTH INSURANCE

## 2018-12-01 ENCOUNTER — Ambulatory Visit (HOSPITAL_COMMUNITY): Payer: PRIVATE HEALTH INSURANCE

## 2018-12-04 ENCOUNTER — Ambulatory Visit (HOSPITAL_COMMUNITY): Payer: PRIVATE HEALTH INSURANCE

## 2018-12-08 ENCOUNTER — Ambulatory Visit (HOSPITAL_COMMUNITY): Payer: PRIVATE HEALTH INSURANCE

## 2018-12-11 ENCOUNTER — Ambulatory Visit (HOSPITAL_COMMUNITY): Payer: PRIVATE HEALTH INSURANCE

## 2018-12-12 ENCOUNTER — Ambulatory Visit (HOSPITAL_COMMUNITY): Payer: PRIVATE HEALTH INSURANCE

## 2018-12-15 ENCOUNTER — Ambulatory Visit (HOSPITAL_COMMUNITY): Payer: PRIVATE HEALTH INSURANCE

## 2018-12-18 ENCOUNTER — Ambulatory Visit (HOSPITAL_COMMUNITY): Payer: PRIVATE HEALTH INSURANCE

## 2018-12-19 ENCOUNTER — Ambulatory Visit (HOSPITAL_COMMUNITY): Payer: PRIVATE HEALTH INSURANCE

## 2018-12-20 ENCOUNTER — Ambulatory Visit (HOSPITAL_COMMUNITY): Payer: PRIVATE HEALTH INSURANCE

## 2018-12-22 ENCOUNTER — Ambulatory Visit (HOSPITAL_COMMUNITY): Payer: PRIVATE HEALTH INSURANCE

## 2018-12-25 ENCOUNTER — Ambulatory Visit (HOSPITAL_COMMUNITY): Payer: PRIVATE HEALTH INSURANCE

## 2018-12-27 ENCOUNTER — Ambulatory Visit (HOSPITAL_COMMUNITY): Payer: PRIVATE HEALTH INSURANCE

## 2018-12-29 ENCOUNTER — Ambulatory Visit (HOSPITAL_COMMUNITY): Payer: PRIVATE HEALTH INSURANCE

## 2019-01-01 ENCOUNTER — Ambulatory Visit (HOSPITAL_COMMUNITY): Payer: PRIVATE HEALTH INSURANCE

## 2019-01-02 NOTE — Progress Notes (Signed)
Rebecca Cuevas 58 y.o. female DOB [birth year] MRN [id number]       Nutrition Screener Note  No diagnosis found. Past Medical History:  Diagnosis Date  . Diabetes mellitus without complication ([place])   . Hypertension    Meds reviewed.   Current Outpatient Medications (Endocrine & Metabolic):  .  metFORMIN (GLUCOPHAGE) 500 MG tablet, Take 1 tablet (500 mg total) by mouth 2 (two) times daily with a meal.  Current Outpatient Medications (Cardiovascular):  .  atorvastatin (LIPITOR) 80 MG tablet, Take 0.5 tablets (40 mg total) by mouth daily. .  carvedilol (COREG) 6.25 MG tablet, Take 1 tablet (6.25 mg total) by mouth 2 (two) times daily with a meal. .  nitroGLYCERIN (NITROSTAT) 0.4 MG SL tablet, Place 1 tablet (0.4 mg total) under the tongue every 5 (five) minutes x 3 doses as needed for chest pain.   Current Outpatient Medications (Analgesics):  .  aspirin EC 81 MG EC tablet, Take 1 tablet (81 mg total) by mouth daily.  Current Outpatient Medications (Hematological):  .  ticagrelor (BRILINTA) 90 MG TABS tablet, Take 1 tablet (90 mg total) by mouth 2 (two) times daily. .  vitamin B-12 (CYANOCOBALAMIN) 500 MCG tablet, Take 500 mcg by mouth daily.  Current Outpatient Medications (Other):  .  alum & mag hydroxide-simeth (MAALOX/MYLANTA) 200-200-20 MG/5ML suspension, Take by mouth every 6 (six) hours as needed for indigestion or heartburn. .  Blood Glucose Monitoring Suppl (BAYER CONTOUR NEXT MONITOR) w/Device KIT, 1 Device by Does not apply route 2 (two) times daily. Marland Kitchen  glucose blood (BAYER CONTOUR NEXT TEST) test strip, Use as instructed .  hydrocortisone cream 1 %, APPLY ONE APPLICATION TOPICALLY ONCE DAILY AS NEEDED FOR ITCHING (FOR ECZEMA). Elmore Guise Devices (MICROLET NEXT LANCING DEVICE) MISC, 1 Device by Does not apply route 2 (two) times daily. .  Multiple Vitamins-Minerals (COMPLETE MULTIVITAMIN/MINERAL PO), Take by mouth. .  mupirocin ointment (BACTROBAN) 2 %, Apply 1 application  topically 2 (two) times daily. Marland Kitchen  nystatin (MYCOSTATIN/NYSTOP) powder, Apply topically 4 (four) times daily. .  ranitidine (ZANTAC) 150 MG tablet, Take 150 mg by mouth daily. .  vitamin C (ASCORBIC ACID) 500 MG tablet, Take 500 mg by mouth daily.   HT: Ht Readings from Last 1 Encounters:  09/04/18 '5\' 6"'  (1.676 m)    WT: Wt Readings from Last 5 Encounters:  09/04/18 194 lb 12.8 oz (88.4 kg)  08/22/18 195 lb 12.8 oz (88.8 kg)  08/09/18 198 lb 11.2 oz (90.1 kg)  05/18/18 203 lb (92.1 kg)  01/25/18 203 lb (92.1 kg)     BMI= 31.46     08/25/18   Current tobacco use? Yes, per medical record       Labs:  Lipid Panel     Component Value Date/Time   CHOL 223 (H) 08/09/2018 1403   TRIG 44 08/09/2018 1403   HDL 65 08/09/2018 1403   CHOLHDL 3.4 08/09/2018 1403   VLDL 9 08/09/2018 1403   LDLCALC 149 (H) 08/09/2018 1403    Lab Results  Component Value Date   HGBA1C 6.6 (H) 08/09/2018   CBG (last 3)  No results for input(s): GLUCAP in the last 72 hours.  Nutrition Diagnosis ? Food-and nutrition-related knowledge deficit related to lack of exposure to information as related to diagnosis of: ? CVD ? Type 2 Diabetes ? Obese  I = 30-34.9 related to excessive energy intake as evidenced by a BMI = 31.46  Nutrition Goal(s):  ? To be determined  Plan:  Pt to attend nutrition classes ? Nutrition I ? Nutrition II ? Portion Distortion  ? Diabetes Blitz ? Diabetes Q & A Will provide client-centered nutrition education as part of interdisciplinary care.   Monitor and evaluate progress toward nutrition goal with team.  Laurina Bustle, MS, RD, LDN 01/02/2019 8:24 PM

## 2019-01-03 ENCOUNTER — Telehealth (HOSPITAL_COMMUNITY): Payer: Self-pay | Admitting: Pharmacist

## 2019-01-03 ENCOUNTER — Ambulatory Visit (HOSPITAL_COMMUNITY): Payer: PRIVATE HEALTH INSURANCE

## 2019-01-04 ENCOUNTER — Encounter (HOSPITAL_COMMUNITY)
Admission: RE | Admit: 2019-01-04 | Discharge: 2019-01-04 | Disposition: A | Discharge status: Home / Self Care | Payer: PRIVATE HEALTH INSURANCE | Source: Ambulatory Visit | Attending: Cardiology | Admitting: Cardiology

## 2019-01-05 ENCOUNTER — Ambulatory Visit (HOSPITAL_COMMUNITY): Payer: PRIVATE HEALTH INSURANCE

## 2019-01-08 ENCOUNTER — Ambulatory Visit (HOSPITAL_COMMUNITY): Payer: PRIVATE HEALTH INSURANCE

## 2019-01-10 ENCOUNTER — Ambulatory Visit (HOSPITAL_COMMUNITY): Payer: PRIVATE HEALTH INSURANCE

## 2019-01-12 ENCOUNTER — Ambulatory Visit (HOSPITAL_COMMUNITY): Payer: PRIVATE HEALTH INSURANCE

## 2019-01-15 ENCOUNTER — Ambulatory Visit (HOSPITAL_COMMUNITY): Payer: PRIVATE HEALTH INSURANCE

## 2019-01-17 ENCOUNTER — Ambulatory Visit (HOSPITAL_COMMUNITY): Payer: PRIVATE HEALTH INSURANCE

## 2019-01-19 ENCOUNTER — Ambulatory Visit (HOSPITAL_COMMUNITY): Payer: PRIVATE HEALTH INSURANCE

## 2019-01-22 ENCOUNTER — Ambulatory Visit (HOSPITAL_COMMUNITY): Payer: PRIVATE HEALTH INSURANCE

## 2019-01-22 ENCOUNTER — Telehealth (HOSPITAL_COMMUNITY): Payer: Self-pay

## 2019-01-22 NOTE — Telephone Encounter (Signed)
Attempted to call patient in regards to Cardiac Rehab - LM with family member

## 2019-01-24 ENCOUNTER — Ambulatory Visit (HOSPITAL_COMMUNITY): Payer: PRIVATE HEALTH INSURANCE

## 2019-01-26 ENCOUNTER — Ambulatory Visit (HOSPITAL_COMMUNITY): Payer: PRIVATE HEALTH INSURANCE

## 2019-01-29 ENCOUNTER — Ambulatory Visit (HOSPITAL_COMMUNITY): Payer: PRIVATE HEALTH INSURANCE

## 2019-01-31 ENCOUNTER — Ambulatory Visit (HOSPITAL_COMMUNITY): Payer: PRIVATE HEALTH INSURANCE

## 2019-01-31 ENCOUNTER — Encounter (HOSPITAL_COMMUNITY): Payer: Self-pay

## 2019-01-31 NOTE — Telephone Encounter (Signed)
Called pt to see if she wanted to participate in cardiac rehab family member stated she would let pt know when she came home.  Mailed letter Tedra [place]. Support Rep II

## 2019-02-02 ENCOUNTER — Ambulatory Visit (HOSPITAL_COMMUNITY): Payer: PRIVATE HEALTH INSURANCE

## 2019-02-05 ENCOUNTER — Ambulatory Visit (HOSPITAL_COMMUNITY): Payer: PRIVATE HEALTH INSURANCE

## 2019-02-07 ENCOUNTER — Ambulatory Visit (HOSPITAL_COMMUNITY): Payer: PRIVATE HEALTH INSURANCE

## 2019-02-09 ENCOUNTER — Ambulatory Visit (HOSPITAL_COMMUNITY): Payer: PRIVATE HEALTH INSURANCE

## 2019-02-12 ENCOUNTER — Ambulatory Visit (HOSPITAL_COMMUNITY): Payer: PRIVATE HEALTH INSURANCE

## 2019-02-14 ENCOUNTER — Ambulatory Visit (HOSPITAL_COMMUNITY): Payer: PRIVATE HEALTH INSURANCE

## 2019-02-16 ENCOUNTER — Ambulatory Visit (HOSPITAL_COMMUNITY): Payer: PRIVATE HEALTH INSURANCE

## 2019-02-16 ENCOUNTER — Other Ambulatory Visit: Payer: Self-pay | Admitting: Nurse Practitioner

## 2019-02-16 DIAGNOSIS — Z122 Encounter for screening for malignant neoplasm of respiratory organs: Secondary | ICD-10-CM

## 2019-02-19 ENCOUNTER — Ambulatory Visit (HOSPITAL_COMMUNITY): Payer: PRIVATE HEALTH INSURANCE

## 2019-02-21 ENCOUNTER — Ambulatory Visit (HOSPITAL_COMMUNITY): Payer: PRIVATE HEALTH INSURANCE

## 2019-02-23 ENCOUNTER — Ambulatory Visit (HOSPITAL_COMMUNITY): Payer: PRIVATE HEALTH INSURANCE

## 2019-02-26 ENCOUNTER — Ambulatory Visit (HOSPITAL_COMMUNITY): Payer: PRIVATE HEALTH INSURANCE

## 2019-02-28 ENCOUNTER — Ambulatory Visit (HOSPITAL_COMMUNITY): Payer: PRIVATE HEALTH INSURANCE

## 2019-03-02 ENCOUNTER — Ambulatory Visit (HOSPITAL_COMMUNITY): Payer: PRIVATE HEALTH INSURANCE

## 2019-03-05 ENCOUNTER — Ambulatory Visit (HOSPITAL_COMMUNITY): Payer: PRIVATE HEALTH INSURANCE

## 2019-03-07 ENCOUNTER — Ambulatory Visit (HOSPITAL_COMMUNITY): Payer: PRIVATE HEALTH INSURANCE

## 2019-03-09 ENCOUNTER — Ambulatory Visit (HOSPITAL_COMMUNITY): Payer: PRIVATE HEALTH INSURANCE

## 2019-03-12 ENCOUNTER — Ambulatory Visit (HOSPITAL_COMMUNITY): Payer: PRIVATE HEALTH INSURANCE

## 2019-03-14 ENCOUNTER — Ambulatory Visit (HOSPITAL_COMMUNITY): Payer: PRIVATE HEALTH INSURANCE

## 2019-03-16 ENCOUNTER — Ambulatory Visit (HOSPITAL_COMMUNITY): Payer: PRIVATE HEALTH INSURANCE

## 2019-03-19 ENCOUNTER — Ambulatory Visit (HOSPITAL_COMMUNITY): Payer: PRIVATE HEALTH INSURANCE

## 2019-03-21 ENCOUNTER — Ambulatory Visit (HOSPITAL_COMMUNITY): Payer: PRIVATE HEALTH INSURANCE

## 2019-03-23 ENCOUNTER — Ambulatory Visit (HOSPITAL_COMMUNITY): Payer: PRIVATE HEALTH INSURANCE

## 2019-03-26 ENCOUNTER — Ambulatory Visit (HOSPITAL_COMMUNITY): Payer: PRIVATE HEALTH INSURANCE

## 2019-03-28 ENCOUNTER — Ambulatory Visit (HOSPITAL_COMMUNITY): Payer: PRIVATE HEALTH INSURANCE

## 2019-03-30 ENCOUNTER — Ambulatory Visit (HOSPITAL_COMMUNITY): Payer: PRIVATE HEALTH INSURANCE

## 2019-04-02 ENCOUNTER — Ambulatory Visit (HOSPITAL_COMMUNITY): Payer: PRIVATE HEALTH INSURANCE

## 2019-04-04 ENCOUNTER — Ambulatory Visit (HOSPITAL_COMMUNITY): Payer: PRIVATE HEALTH INSURANCE

## 2019-04-06 ENCOUNTER — Ambulatory Visit (HOSPITAL_COMMUNITY): Payer: PRIVATE HEALTH INSURANCE

## 2019-04-09 ENCOUNTER — Ambulatory Visit (HOSPITAL_COMMUNITY): Payer: PRIVATE HEALTH INSURANCE

## 2019-04-11 ENCOUNTER — Ambulatory Visit (HOSPITAL_COMMUNITY): Payer: PRIVATE HEALTH INSURANCE

## 2019-05-17 ENCOUNTER — Telehealth: Payer: Self-pay | Admitting: Cardiology

## 2019-05-17 NOTE — Telephone Encounter (Signed)
New Message:    Pt says she needs to have her tooth extracted. She wants to know will this be alright with her condition?

## 2019-05-18 NOTE — Telephone Encounter (Signed)
Patient is on brilinta, stent 07/2018, will forward to flex pa to review and advise

## 2019-05-20 NOTE — Telephone Encounter (Signed)
Covering [employer] Box:   She may stop Brilinta for 5 days prior to extraction and restart ASAP as directed by oral surgeon.

## 2019-05-21 NOTE — Telephone Encounter (Signed)
Called and spoke with mother- she wrote down instructions. I advised to call back if she had questions concerning this.

## 2019-06-28 ENCOUNTER — Other Ambulatory Visit: Payer: Self-pay | Admitting: Family Medicine

## 2019-06-28 DIAGNOSIS — L84 Corns and callosities: Secondary | ICD-10-CM

## 2019-06-28 DIAGNOSIS — E119 Type 2 diabetes mellitus without complications: Secondary | ICD-10-CM

## 2019-08-09 ENCOUNTER — Other Ambulatory Visit: Payer: Self-pay | Admitting: Physician Assistant

## 2019-08-10 NOTE — Telephone Encounter (Signed)
Rx request sent to pharmacy.  

## 2019-08-15 ENCOUNTER — Other Ambulatory Visit: Payer: Self-pay | Admitting: Physician Assistant

## 2019-08-24 ENCOUNTER — Ambulatory Visit (INDEPENDENT_AMBULATORY_CARE_PROVIDER_SITE_OTHER): Payer: PRIVATE HEALTH INSURANCE | Admitting: Cardiology

## 2019-08-24 ENCOUNTER — Other Ambulatory Visit: Payer: Self-pay

## 2019-08-24 ENCOUNTER — Encounter: Payer: Self-pay | Admitting: Cardiology

## 2019-08-24 VITALS — BP 127/80 | HR 87 | Temp 97.3°F | Ht 66.0 in | Wt 201.2 lb

## 2019-08-24 DIAGNOSIS — I10 Essential (primary) hypertension: Secondary | ICD-10-CM | POA: Diagnosis not present

## 2019-08-24 DIAGNOSIS — R000 Tachycardia, unspecified: Secondary | ICD-10-CM

## 2019-08-24 DIAGNOSIS — E119 Type 2 diabetes mellitus without complications: Secondary | ICD-10-CM

## 2019-08-24 DIAGNOSIS — F17200 Nicotine dependence, unspecified, uncomplicated: Secondary | ICD-10-CM | POA: Diagnosis not present

## 2019-08-24 DIAGNOSIS — Z955 Presence of coronary angioplasty implant and graft: Secondary | ICD-10-CM | POA: Insufficient documentation

## 2019-08-24 DIAGNOSIS — E1169 Type 2 diabetes mellitus with other specified complication: Secondary | ICD-10-CM | POA: Insufficient documentation

## 2019-08-24 DIAGNOSIS — I25119 Atherosclerotic heart disease of native coronary artery with unspecified angina pectoris: Secondary | ICD-10-CM | POA: Diagnosis not present

## 2019-08-24 DIAGNOSIS — E785 Hyperlipidemia, unspecified: Secondary | ICD-10-CM | POA: Insufficient documentation

## 2019-08-24 DIAGNOSIS — I214 Non-ST elevation (NSTEMI) myocardial infarction: Secondary | ICD-10-CM

## 2019-08-24 MED ORDER — TICAGRELOR 60 MG PO TABS: 60.0000 mg | ORAL_TABLET | Freq: Two times a day (BID) | ORAL | 3 refills | Status: DC

## 2019-08-24 NOTE — Patient Instructions (Signed)
Medication Instructions:   STOP ASPIRIN  COMPLETE BRILINTA 90MG   THEN STOP  AN TAKE 60 MG TWICE A DAY    If you need a refill on your cardiac medications before your next appointment, please call your pharmacy.   Lab work: [place]  If you have labs (blood work) drawn today and your tests are completely normal, you will receive your results only by: Marland Kitchen MyChart Message (if you have MyChart) OR . A paper copy in the mail If you have any lab test that is abnormal or we need to change your treatment, we will call you to review the results.  Testing/Procedures: NOT NEEDED  Follow-Up: At [facility], you and your health needs are our priority.  As part of our continuing mission to provide you with exceptional heart care, we have created designated Provider Care Teams.  These Care Teams include your primary Cardiologist (physician) and Advanced Practice Providers (APPs -  Physician Assistants and Nurse Practitioners) who all work together to provide you with the care you need, when you need it. . You will need a follow up appointment in  6  [facility] 2021.  Please call our office 2 months in advance to schedule this appointment.  You may see Glenetta Hew, MD or one of the following Advanced Practice Providers on your designated Care Team:   . Rosaria Ferries, PA-C . Jory Sims, DNP, ANP  Any Other Special Instructions Will Be Listed Below (If Applicable).

## 2019-08-24 NOTE — Progress Notes (Signed)
 PCP: Elkins, Wilson Oliver, MD  Clinic Note: Chief Complaint  Patient presents with  . Follow-up    Delayed six-month now annual  . Coronary Artery Disease    Non-STEMI with RCA PCI in August 2018    HPI: Rebecca Cuevas is a 58 y.o. female with a PMH of Inferior NSTEMI in Aug 2019 - PCI RCA who presents today for annual f/u. .  Rebecca Cuevas was last seen in September 2019 by Kathryn Lawrence, NP.  Plan was to follow-up in 6 months, however this was delayed due to COVID-19 lockdown.  No major complaints.  Doing well. -->" About 2-week follow-up to her initial hospital follow-up when she noted elevated blood pressures.  Decided to continue to stay off of ARB because of hypotension in the hospital.  Blood pressure went up when she smoked extra cigarettes.  Recent Hospitalizations: n/a  Studies Personally Reviewed - (if available, images/films reviewed: From Epic Chart or Care Everywhere) - 08/09/2018  Cath-PCI: prox RCA 99% (thrombotic) stenosis (DES PCI with distal emobolization to rPAV; Synergy DES 3.5 x 20), -> rPAV 80% (distal embolization). p-mCx 30%. p-m LAD 40%. EF 50-55% w/ Inferior HK.  Echo 08/09/2018: Mild concentric LVH with vigorous function.  EF 65-70%.  No RWMA.  GR 1 DD.  Interval History: Rebecca Cuevas returns here today for annual follow-up really stating that she is doing okay overall from a cardiac standpoint.  She still smokes about 10 cigarettes a day, but this is much less than she used to smoke.  She is due to see her PCP soon.  She said she walks a lot all day long with work.  She says at the end of day her feet hurt, but no real significant swelling.  She has intermittent chest discomfort off and on, right now is a sharp stabbing type pain on the left side of her central chest wall, but this is nothing like her anginal type pain when she had her MI.  She is not having any exertional chest pain.  No resting exertional dyspnea.  No heart failure symptoms of  PND, orthopnea or edema. No palpitations, lightheadedness, dizziness, weakness or syncope/near syncope. No TIA/amaurosis fugax symptoms. No melena, hematochezia, hematuria, or epstaxis. No claudication. R 2-3 costo-sternal pain - reproducible   ROS: A comprehensive was performed. Review of Systems  Constitutional: Negative for malaise/fatigue and weight loss (Actually has gained weight since the COVID-19 lockdown).  HENT: Negative for congestion and nosebleeds.   Respiratory: Positive for cough (Mild daily smoker's cough). Negative for shortness of breath.   Cardiovascular: Positive for chest pain (Central chest wall pain).  Gastrointestinal: Positive for heartburn. Negative for constipation and nausea.  Genitourinary: Negative for hematuria.  Musculoskeletal: Negative for joint pain (May have some off and on arthritis pains in her knees and hips).  Neurological: Negative.  Negative for dizziness, focal weakness, weakness and headaches.  Psychiatric/Behavioral: Negative for depression and memory loss. The patient is not nervous/anxious and does not have insomnia.   All other systems reviewed and are negative.   The patient does not have symptoms concerning for COVID-19 infection (fever, chills, cough, or new shortness of breath).  The patient is practicing social distancing.   COVID-19 Education: The signs and symptoms of COVID-19 were discussed with the patient and how to seek care for testing (follow up with PCP or arrange E-visit).   The importance of social distancing was discussed today.   I have reviewed and (if needed) personally updated the patient's   problem list, medications, allergies, past medical and surgical history, social and family history.   Past Medical History:  Diagnosis Date  . CAD S/P DES PCI 08/09/2018   Thrombectomy & DES PCI of pRCA 99% - Synergy DES 3.5 x 20.  . Diabetes mellitus without complication (HCC)   . H/O non-ST elevation myocardial infarction  (NSTEMI) 08/09/2018   99% thrombotic pRCA --> thrombectomy & DES PCI (overnight aggrastat for distal embolization to RPAV) -  Lv Gram EF 50-55% with Inferior HK -> f/u Echo with Hyperdynamic LV, EF 65-70% & no RWMA.  . Hypertension     Past Surgical History:  Procedure Laterality Date  . ABDOMINAL HYSTERECTOMY    . CESAREAN SECTION    . CORONARY STENT INTERVENTION N/A 08/09/2018   Procedure: CORONARY STENT INTERVENTION;  Surgeon: Arida, Muhammad A, MD;  Location: [facility] INVASIVE [facility] LAB;  Service: Cardiovascular;  prox RCA 99% (thrombotic) stenosis (DES PCI with distal emobolization to rPAV; Synergy DES 3.5 x 20), -> rPAV 80% (distal embolization)  . HERNIA REPAIR    . LEFT HEART CATH AND CORONARY ANGIOGRAPHY N/A 08/09/2018   Procedure: LEFT HEART CATH AND CORONARY ANGIOGRAPHY;  Surgeon: Arida, Muhammad A, MD;  Location: [facility] INVASIVE CV LAB;  prox RCA 99% (thrombotic) stenosis (DES PCI with distal emobolization to rPAV; Synergy DES), -> rPAV 80% (distal embolization). p-mCx 30%. p-m LAD 40%. EF 50-55% w/ Inferior HK.  . TRANSTHORACIC ECHOCARDIOGRAM  08/10/2018   post Inf NSTEMI --> Mild concentric LVH with vigorous function.  EF 65-70%.  No RWMA.  GR 1 DD.   Cath PCI 08/09/2018: (thrombectomy & DES PCI pRCA) Synergy DES 3.5 x 20 (distal embolization to RPAV, overnight Aggrastat). EF 50-55% - inferior HK.    Current Meds  Medication Sig  . atorvastatin (LIPITOR) 80 MG tablet Take 0.5 tablets (40 mg total) by mouth daily.  . Blood Glucose Monitoring Suppl (BAYER CONTOUR NEXT MONITOR) w/Device KIT 1 Device by Does not apply route 2 (two) times daily.  . carvedilol (COREG) 6.25 MG tablet TAKE 1 TABLET BY MOUTH TWICE DAILY WITH A MEAL  . glucose blood (BAYER CONTOUR NEXT TEST) test strip Use as instructed  . hydrocortisone cream 1 % APPLY ONE APPLICATION TOPICALLY ONCE DAILY AS NEEDED FOR ITCHING (FOR ECZEMA).  . Lancet Devices (MICROLET NEXT LANCING DEVICE) MISC 1 Device by Does not apply route 2  (two) times daily.  . metFORMIN (GLUCOPHAGE) 500 MG tablet TAKE 1 TABLET BY MOUTH TWICE DAILY WITH MEALS  . Multiple Vitamins-Minerals (COMPLETE MULTIVITAMIN/MINERAL PO) Take by mouth.  . nitroGLYCERIN (NITROSTAT) 0.4 MG SL tablet Place 1 tablet (0.4 mg total) under the tongue every 5 (five) minutes x 3 doses as needed for chest pain.  . vitamin B-12 (CYANOCOBALAMIN) 500 MCG tablet Take 500 mcg by mouth daily.  . vitamin C (ASCORBIC ACID) 500 MG tablet Take 500 mg by mouth daily.  . [DISCONTINUED] aspirin EC 81 MG EC tablet Take 1 tablet (81 mg total) by mouth daily.  . [DISCONTINUED] BRILINTA 90 MG TABS tablet Take 1 tablet by mouth twice daily    Allergies  Allergen Reactions  . Contrast Media [Iodinated Diagnostic Agents] Hives, Shortness Of Breath and Itching    Pt broke out in large hives on her face after CT scan with contrast injection. Pt became slightly more short of breath than she was already after scan as well. Pt needs pre-meds prior to future studies.     Social History   Tobacco Use  .   Smoking status: Current Every Day Smoker    Packs/day: 0.50  . Smokeless tobacco: Never Used  . Tobacco comment: 8 cigs a day  Substance Use Topics  . Alcohol use: Yes    Alcohol/week: 0.0 standard drinks    Comment: occasional  . Drug use: No   Social History   Social History Narrative  . Not on file    family history includes Diabetes in her father; Hypertension in her mother.  Wt Readings from Last 3 Encounters:  08/24/19 201 lb 3.2 oz (91.3 kg)  09/04/18 194 lb 12.8 oz (88.4 kg)  08/22/18 195 lb 12.8 oz (88.8 kg)    PHYSICAL EXAM BP 127/80   Pulse 87   Temp (!) 97.3 F (36.3 C)   Ht 5' 6" (1.676 m)   Wt 201 lb 3.2 oz (91.3 kg)   SpO2 97%   BMI 32.47 kg/m  Physical Exam  Constitutional: She is oriented to person, place, and time. She appears well-developed and well-nourished. No distress.  Well-groomed.  Healthy-appearing.  Does smell like cigarettes  HENT:   Head: Normocephalic and atraumatic.  Neck: Normal range of motion. Neck supple. No hepatojugular reflux and no JVD present. Carotid bruit is not present.  Cardiovascular: Normal rate, regular rhythm and normal pulses.  No extrasystoles are present. PMI is not displaced. Exam reveals distant heart sounds. Exam reveals no gallop and no friction rub.  No murmur heard. Pulmonary/Chest: Effort normal. No respiratory distress. She has no wheezes. She has no rales. She exhibits tenderness (Point tenderness along the left second and third costochondral joint.).  Mostly normal breath sounds with mild interstitial sounds.  Abdominal: Soft. Bowel sounds are normal. She exhibits no distension. There is no abdominal tenderness. There is no rebound.  Musculoskeletal: Normal range of motion.        General: No edema.  Neurological: She is alert and oriented to person, place, and time.  Psychiatric: She has a normal mood and affect. Her behavior is normal. Judgment and thought content normal.  Vitals reviewed.    Adult ECG Report  Rate: 87 ;  Rhythm: normal sinus rhythm and LVH with mild repolarization changes.  But normal axis, intervals and durations;   Narrative Interpretation: Stable EKG   Other studies Reviewed: Additional studies/ records that were reviewed today include:  Recent Labs: Has not had labs since   ASSESSMENT / PLAN: Problem List Items Addressed This Visit    Coronary artery disease involving native coronary artery of native heart with angina pectoris (HCC) - Primary (Chronic)    DES PCI to the RCA.  No further anginal symptoms.  I suspect that the distal embolization have resolved.  He is on aspirin and Brilinta -> now completed 1 year.  Okay to stop aspirin and continue Brilinta (reduced to 60 mg twice daily after current bottle complete). On stable dose of beta-blocker and statin.      Relevant Orders   EKG 12-Lead   Lipid panel   Comprehensive metabolic panel   Diabetes  mellitus type II, non insulin dependent (HCC) (Chronic)    Is only on metformin.  Strongly consider use of SGLT 2 inhibitor such as Jardiance or Farxiga.      Essential hypertension (Chronic)    Blood pressure seems stable.  She never really did start back on the losartan.  Is now only on carvedilol.      Relevant Orders   EKG 12-Lead   Hyperlipidemia associated with type 2 DM - Goal   LDL < 70. (Chronic)    Lipids were not very controlled at the time of her MI.  Is on high-dose statin now.  Has not had labs checked in a while.  Due for labs to be checked.  Unfortunately, she was not fasting today.  Will order for this month.      Relevant Orders   Lipid panel   Comprehensive metabolic panel   NSTEMI (non-ST elevated myocardial infarction) (HCC) (Chronic)    Now 1 year out from her MI.  Doing well.  Thankfully, her post PCI echo showed hyperdynamic LV. There was distal left IV Aggrastat and seems to have resolved.  No further anginal symptoms.      Presence of drug coated stent in right coronary artery (Chronic)    DES to RCA: Now completed 1 year from MI.   DC aspirin  Convert to 60 mg twice daily Brilinta for maintenance for at least another 12 months.  Okay to hold Brilinta 5-7 days preop for any procedures or surgeries.       Relevant Orders   EKG 12-Lead   Tachycardia    Now normalized with carvedilol.  Preferentially use beta-blocker for blood pressure based on elevated heart rate peri-MI.      TOBACCO DEPENDENCE (Chronic)    She talked about possibly starting patch.  I think is probably a reasonable idea.  Smoking cessation is crucial as this is 1 of her major risk factors for CAD.  Smoking cessation instruction/counseling given:  counseled patient on the dangers of tobacco use, advised patient to stop smoking, and reviewed strategies to maximize success.  I did congratulate her for cutting down to less than half pack a day.  Encouraged her that multiple attempts may  be required to finally quit.  ~3-5 minutes spent          I spent a total of 25 minutes with the patient and chart review. >  50% of the time was spent in direct patient consultation.   Current medicines are reviewed at length with the patient today.  (+/- concerns) n/a The following changes have been made:  n/a  Patient Instructions  Medication Instructions:   STOP ASPIRIN  COMPLETE BRILINTA 90MG  THEN STOP  AN TAKE 60 MG TWICE A DAY    If you need a refill on your cardiac medications before your next appointment, please call your pharmacy.   Lab work: [place]  If you have labs (blood work) drawn today and your tests are completely normal, you will receive your results only by: Marland Kitchen MyChart Message (if you have MyChart) OR . A paper copy in the mail If you have any lab test that is abnormal or we need to change your treatment, we will call you to review the results.  Testing/Procedures: NOT NEEDED  Follow-Up: At [facility], you and your health needs are our priority.  As part of our continuing mission to provide you with exceptional heart care, we have created designated Provider Care Teams.  These Care Teams include your primary Cardiologist (physician) and Advanced Practice Providers (APPs -  Physician Assistants and Nurse Practitioners) who all work together to provide you with the care you need, when you need it. . You will need a follow up appointment in  6  [facility] 2021.  Please call our office 2 months in advance to schedule this appointment.  You may see Glenetta Hew, MD or one of the following Advanced Practice Providers on your  designated Care Team:   . Rosaria Ferries, PA-C . Jory Sims, DNP, ANP  Any Other Special Instructions Will Be Listed Below (If Applicable).     Studies Ordered:   Orders Placed This Encounter  Procedures  . Lipid panel  . Comprehensive metabolic panel  . EKG 12-Lead      Glenetta Hew, M.D., M.S.  Interventional Cardiologist   Pager # [phone] Phone # [phone] [address] [place], [place] [address]   Thank you for choosing Heart[facility] at [facility]!!

## 2019-08-26 ENCOUNTER — Encounter: Payer: Self-pay | Admitting: Cardiology

## 2019-08-26 NOTE — Assessment & Plan Note (Signed)
Now normalized with carvedilol.  Preferentially use beta-blocker for blood pressure based on elevated heart rate peri-MI.

## 2019-08-26 NOTE — Assessment & Plan Note (Signed)
DES PCI to the RCA.  No further anginal symptoms.  I suspect that the distal embolization have resolved.  He is on aspirin and Brilinta -> now completed 1 year.  Okay to stop aspirin and continue Brilinta (reduced to 60 mg twice daily after current bottle complete). On stable dose of beta-blocker and statin.

## 2019-08-26 NOTE — Assessment & Plan Note (Signed)
Blood pressure seems stable.  She never really did start back on the losartan.  Is now only on carvedilol.

## 2019-08-26 NOTE — Assessment & Plan Note (Signed)
She talked about possibly starting patch.  I think is probably a reasonable idea.  Smoking cessation is crucial as this is 1 of her major risk factors for CAD.  Smoking cessation instruction/counseling given:  counseled patient on the dangers of tobacco use, advised patient to stop smoking, and reviewed strategies to maximize success.  I did congratulate her for cutting down to less than half pack a day.  Encouraged her that multiple attempts may be required to finally quit.  ~3-5 minutes spent

## 2019-08-26 NOTE — Assessment & Plan Note (Signed)
DES to RCA: Now completed 1 year from MI.   DC aspirin  Convert to 60 mg twice daily Brilinta for maintenance for at least another 12 months.  Okay to hold Brilinta 5-7 days preop for any procedures or surgeries.

## 2019-08-26 NOTE — Assessment & Plan Note (Signed)
Lipids were not very controlled at the time of her MI.  Is on high-dose statin now.  Has not had labs checked in a while.  Due for labs to be checked.  Unfortunately, she was not fasting today.  Will order for this month.

## 2019-08-26 NOTE — Assessment & Plan Note (Signed)
Now 1 year out from her MI.  Doing well.  Thankfully, her post PCI echo showed hyperdynamic LV. There was distal left IV Aggrastat and seems to have resolved.  No further anginal symptoms.

## 2019-08-26 NOTE — Assessment & Plan Note (Signed)
Is only on metformin.  Strongly consider use of SGLT 2 inhibitor such as [place] or [place].

## 2019-08-29 ENCOUNTER — Ambulatory Visit (INDEPENDENT_AMBULATORY_CARE_PROVIDER_SITE_OTHER): Payer: PRIVATE HEALTH INSURANCE | Admitting: Podiatry

## 2019-08-29 ENCOUNTER — Other Ambulatory Visit: Payer: Self-pay

## 2019-08-29 DIAGNOSIS — B351 Tinea unguium: Secondary | ICD-10-CM

## 2019-08-29 DIAGNOSIS — B353 Tinea pedis: Secondary | ICD-10-CM

## 2019-08-29 DIAGNOSIS — M79676 Pain in unspecified toe(s): Secondary | ICD-10-CM | POA: Diagnosis not present

## 2019-08-29 DIAGNOSIS — L989 Disorder of the skin and subcutaneous tissue, unspecified: Secondary | ICD-10-CM | POA: Diagnosis not present

## 2019-08-29 MED ORDER — CLOTRIMAZOLE-BETAMETHASONE 1-0.05 % EX CREA: 1.0000 "application " | TOPICAL_CREAM | Freq: Two times a day (BID) | CUTANEOUS | 3 refills | Status: DC

## 2019-09-01 NOTE — Progress Notes (Signed)
    Subjective: Patient is a 58 y.o. female presenting to the office today as a new patient with a chief complaint of painful callus lesion(s) noted to the bilateral feet that have been present for the past few years. Walking barefoot increases the pain. She has not had any recent treatment for the symptoms.  Patient also complains of elongated, thickened nails that cause pain while ambulating in shoes. She is unable to trim her own nails. Patient presents today for further treatment and evaluation.  Past Medical History:  Diagnosis Date  . CAD S/P DES PCI 08/09/2018   Thrombectomy & DES PCI of pRCA 99% - Synergy DES 3.5 x 20.  . Diabetes mellitus without complication ([place])   . H/O non-ST elevation myocardial infarction (NSTEMI) 08/09/2018   99% thrombotic pRCA --> thrombectomy & DES PCI (overnight aggrastat for distal embolization to RPAV) -  Lv Gram EF 50-55% with Inferior HK -> f/u Echo with Hyperdynamic LV, EF 65-70% & no RWMA.  . Hypertension     Objective:  Physical Exam General: Alert and oriented x3 in no acute distress  Dermatology: Hyperkeratotic lesion(s) present on the bilateral feet. Pain on palpation with a central nucleated core noted. Skin is warm, dry and supple bilateral lower extremities. Negative for open lesions or macerations. Nails are tender, long, thickened and dystrophic with subungual debris, consistent with onychomycosis, 1-5 bilateral.  Pruritus noted to the bilateral feet with hyperkeratosis. No signs of infection noted.  Vascular: Palpable pedal pulses bilaterally. No edema or erythema noted. Capillary refill within normal limits.  Neurological: Epicritic and protective threshold diminished bilaterally.   Musculoskeletal Exam: Pain on palpation at the keratotic lesion(s) noted. Range of motion within normal limits bilateral. Muscle strength 5/5 in all groups bilateral.  Assessment: 1. Onychodystrophic nails 1-5 bilateral with hyperkeratosis of nails.  2.  Onychomycosis of nail due to dermatophyte bilateral 3. Pre-ulcerative callus lesions noted to the bilateral feet x 6 4. Tinea pedis bilateral    Plan of Care:  1. Patient evaluated. 2. Excisional debridement of keratoic lesion(s) using a chisel blade was performed without incident.  3. Dressed with light dressing. 4. Mechanical debridement of nails 1-5 bilaterally performed using a nail nipper. Filed with dremel without incident.  5. Prescription for Lotrisone cream provided to patient.  6. Patient is to return to the clinic in 3 months.   Edrick Kins, DPM [facility]  Dr. Edrick Kins, [place]                                        [place], [place] [address]                Office [phone]  Fax [phone]

## 2019-09-04 LAB — COMPREHENSIVE METABOLIC PANEL
ALT: 27 IU/L (ref 0–32)
AST: 26 IU/L (ref 0–40)
Albumin/Globulin Ratio: 1.3 (ref 1.2–2.2)
Albumin: 4 g/dL (ref 3.8–4.9)
Alkaline Phosphatase: 135 IU/L — ABNORMAL HIGH (ref 39–117)
BUN/Creatinine Ratio: 11 (ref 9–23)
BUN: 9 mg/dL (ref 6–24)
Bilirubin Total: 0.2 mg/dL (ref 0.0–1.2)
CO2: 23 mmol/L (ref 20–29)
Calcium: 9.6 mg/dL (ref 8.7–10.2)
Chloride: 107 mmol/L — ABNORMAL HIGH (ref 96–106)
Creatinine, Ser: 0.82 mg/dL (ref 0.57–1.00)
GFR calc Af Amer: 91 mL/min/{1.73_m2} (ref >59)
GFR calc non Af Amer: 79 mL/min/{1.73_m2} (ref >59)
Globulin, Total: 3.2 g/dL (ref 1.5–4.5)
Glucose: 100 mg/dL — ABNORMAL HIGH (ref 65–99)
Potassium: 4.8 mmol/L (ref 3.5–5.2)
Sodium: 143 mmol/L (ref 134–144)
Total Protein: 7.2 g/dL (ref 6.0–8.5)

## 2019-09-04 LAB — LIPID PANEL
Chol/HDL Ratio: 2.4 ratio (ref 0.0–4.4)
Cholesterol, Total: 151 mg/dL (ref 100–199)
HDL: 64 mg/dL (ref >39)
LDL Chol Calc (NIH): 72 mg/dL (ref 0–99)
Triglycerides: 75 mg/dL (ref 0–149)
VLDL Cholesterol Cal: 15 mg/dL (ref 5–40)

## 2019-09-20 ENCOUNTER — Telehealth: Payer: Self-pay | Admitting: *Deleted

## 2019-09-20 DIAGNOSIS — I25119 Atherosclerotic heart disease of native coronary artery with unspecified angina pectoris: Secondary | ICD-10-CM

## 2019-09-20 DIAGNOSIS — E1169 Type 2 diabetes mellitus with other specified complication: Secondary | ICD-10-CM

## 2019-09-20 NOTE — Telephone Encounter (Signed)
unable to leave message on both phones - will try again to give results on lipids

## 2019-09-20 NOTE — Telephone Encounter (Signed)
-----   Message from Leonie Man, MD sent at 09/17/2019 10:45 PM EDT ----- Lab results show pretty good cholesterol control.  LDL is almost at goal at 72.  I would like to reassess in about 6 months to see what direction we are going.  For now continue current medicines.  Continue heart-healthy/smart dietary adjustment and exercise.  Chemistry panel shows essentially normal kidney and liver function with normal electrolytes.  Glenetta Hew, MD

## 2019-09-21 ENCOUNTER — Other Ambulatory Visit: Payer: Self-pay | Admitting: Family Medicine

## 2019-09-21 ENCOUNTER — Ambulatory Visit
Admission: RE | Admit: 2019-09-21 | Discharge: 2019-09-21 | Disposition: A | Discharge status: Home / Self Care | Payer: PRIVATE HEALTH INSURANCE | Source: Ambulatory Visit | Attending: Family Medicine | Admitting: Family Medicine

## 2019-09-21 DIAGNOSIS — M79672 Pain in left foot: Secondary | ICD-10-CM

## 2019-09-27 ENCOUNTER — Other Ambulatory Visit: Payer: Self-pay | Admitting: Family Medicine

## 2019-09-27 DIAGNOSIS — Z122 Encounter for screening for malignant neoplasm of respiratory organs: Secondary | ICD-10-CM

## 2019-09-28 ENCOUNTER — Other Ambulatory Visit: Payer: Self-pay | Admitting: Physician Assistant

## 2019-09-28 ENCOUNTER — Other Ambulatory Visit: Payer: Self-pay | Admitting: Family Medicine

## 2019-09-28 DIAGNOSIS — L84 Corns and callosities: Secondary | ICD-10-CM

## 2019-09-28 DIAGNOSIS — E119 Type 2 diabetes mellitus without complications: Secondary | ICD-10-CM

## 2019-09-28 NOTE — Telephone Encounter (Signed)
This is Dr. Harding's pt. °

## 2019-10-04 ENCOUNTER — Encounter: Payer: Self-pay | Admitting: *Deleted

## 2019-10-04 NOTE — Telephone Encounter (Signed)
UN ABLE TO REACH PATIENT LETTER SET WITH RESULTS AND LABSLIP

## 2019-11-28 ENCOUNTER — Ambulatory Visit: Payer: PRIVATE HEALTH INSURANCE | Admitting: Podiatry

## 2019-12-03 IMAGING — CR DG CHEST 2V
2 series · 2 of 2 positions shown · non-contrast
Comparison: 08/06/2013

CLINICAL DATA: Anterior midline chest pain

EXAM:
CHEST - 2 VIEW

[w chest pa]
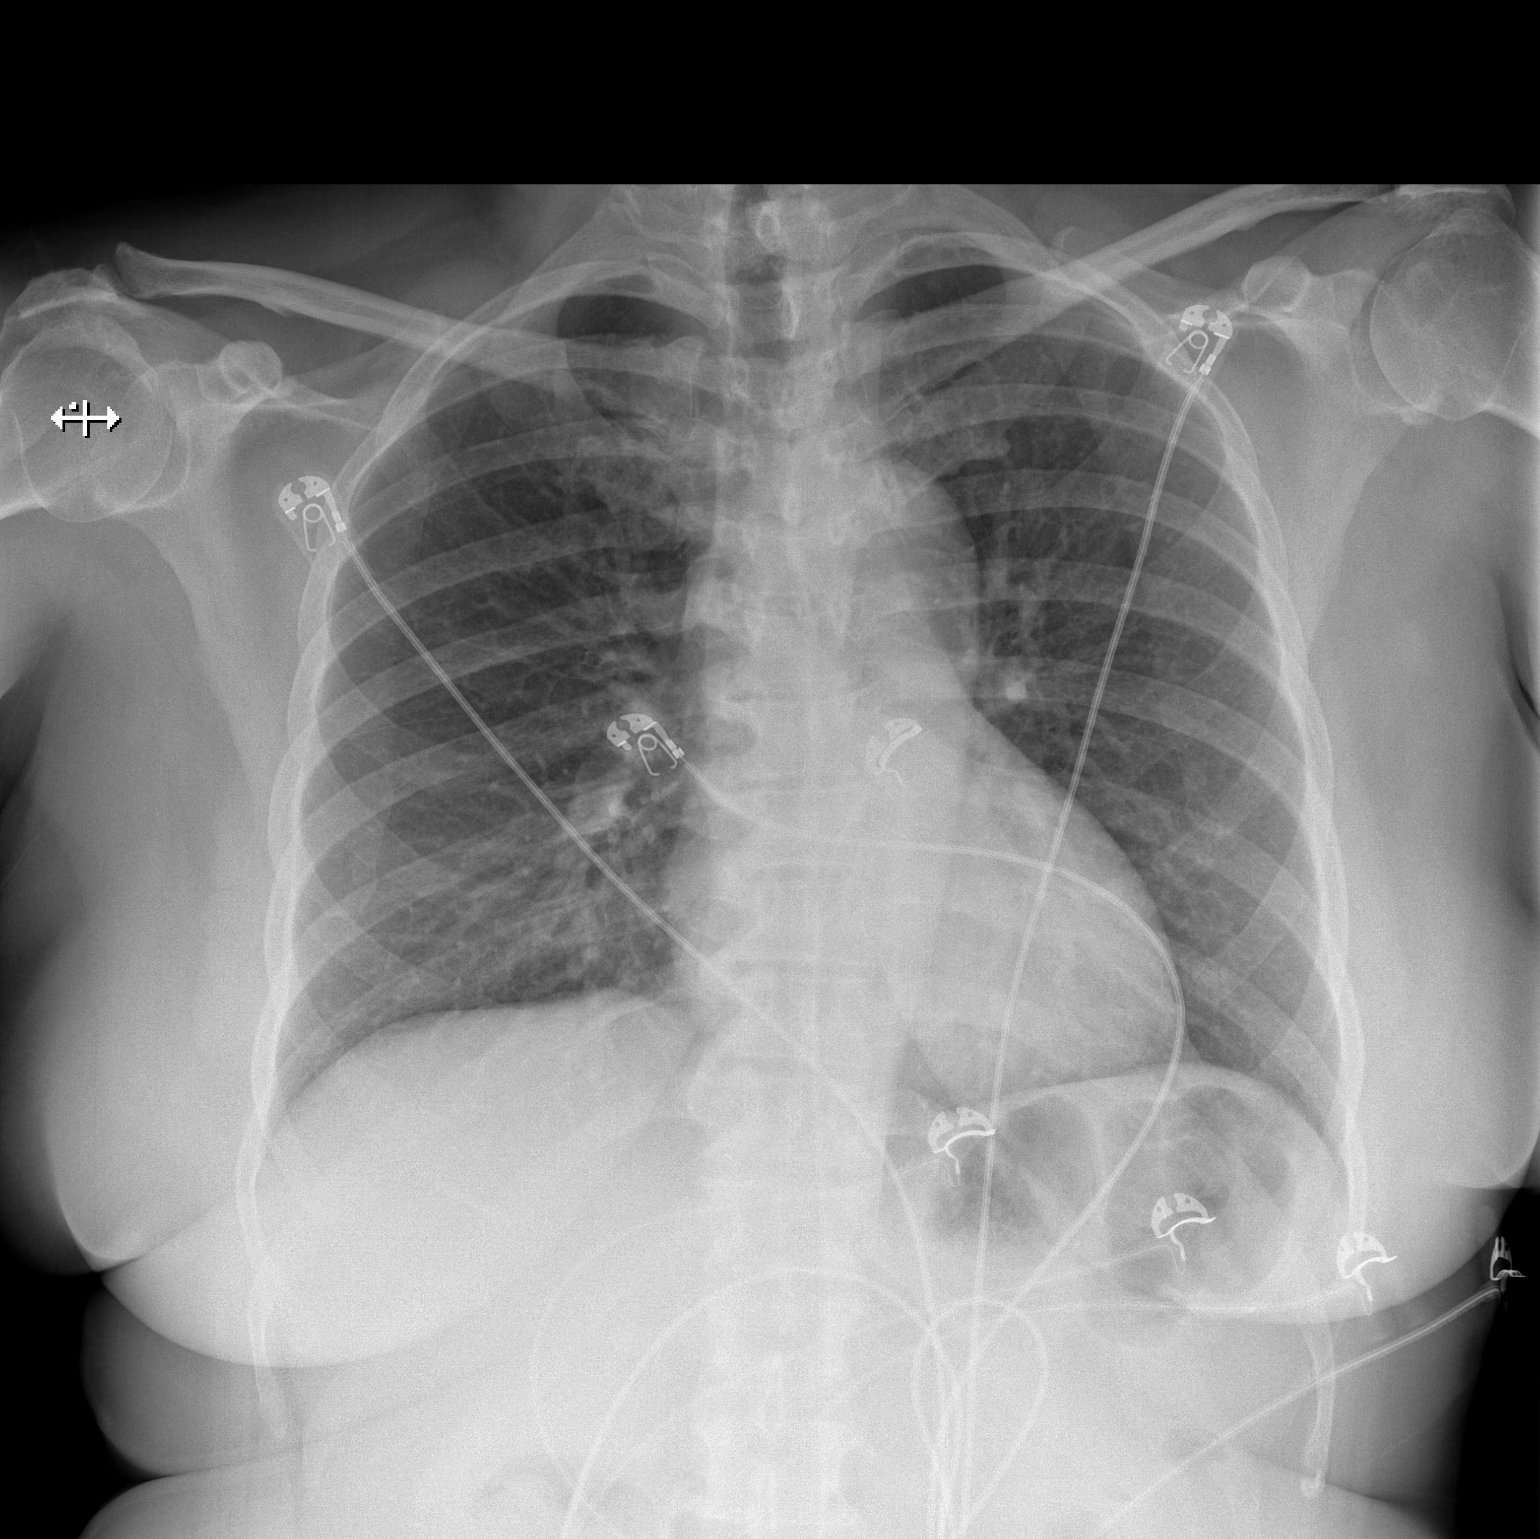

[w chest lat]
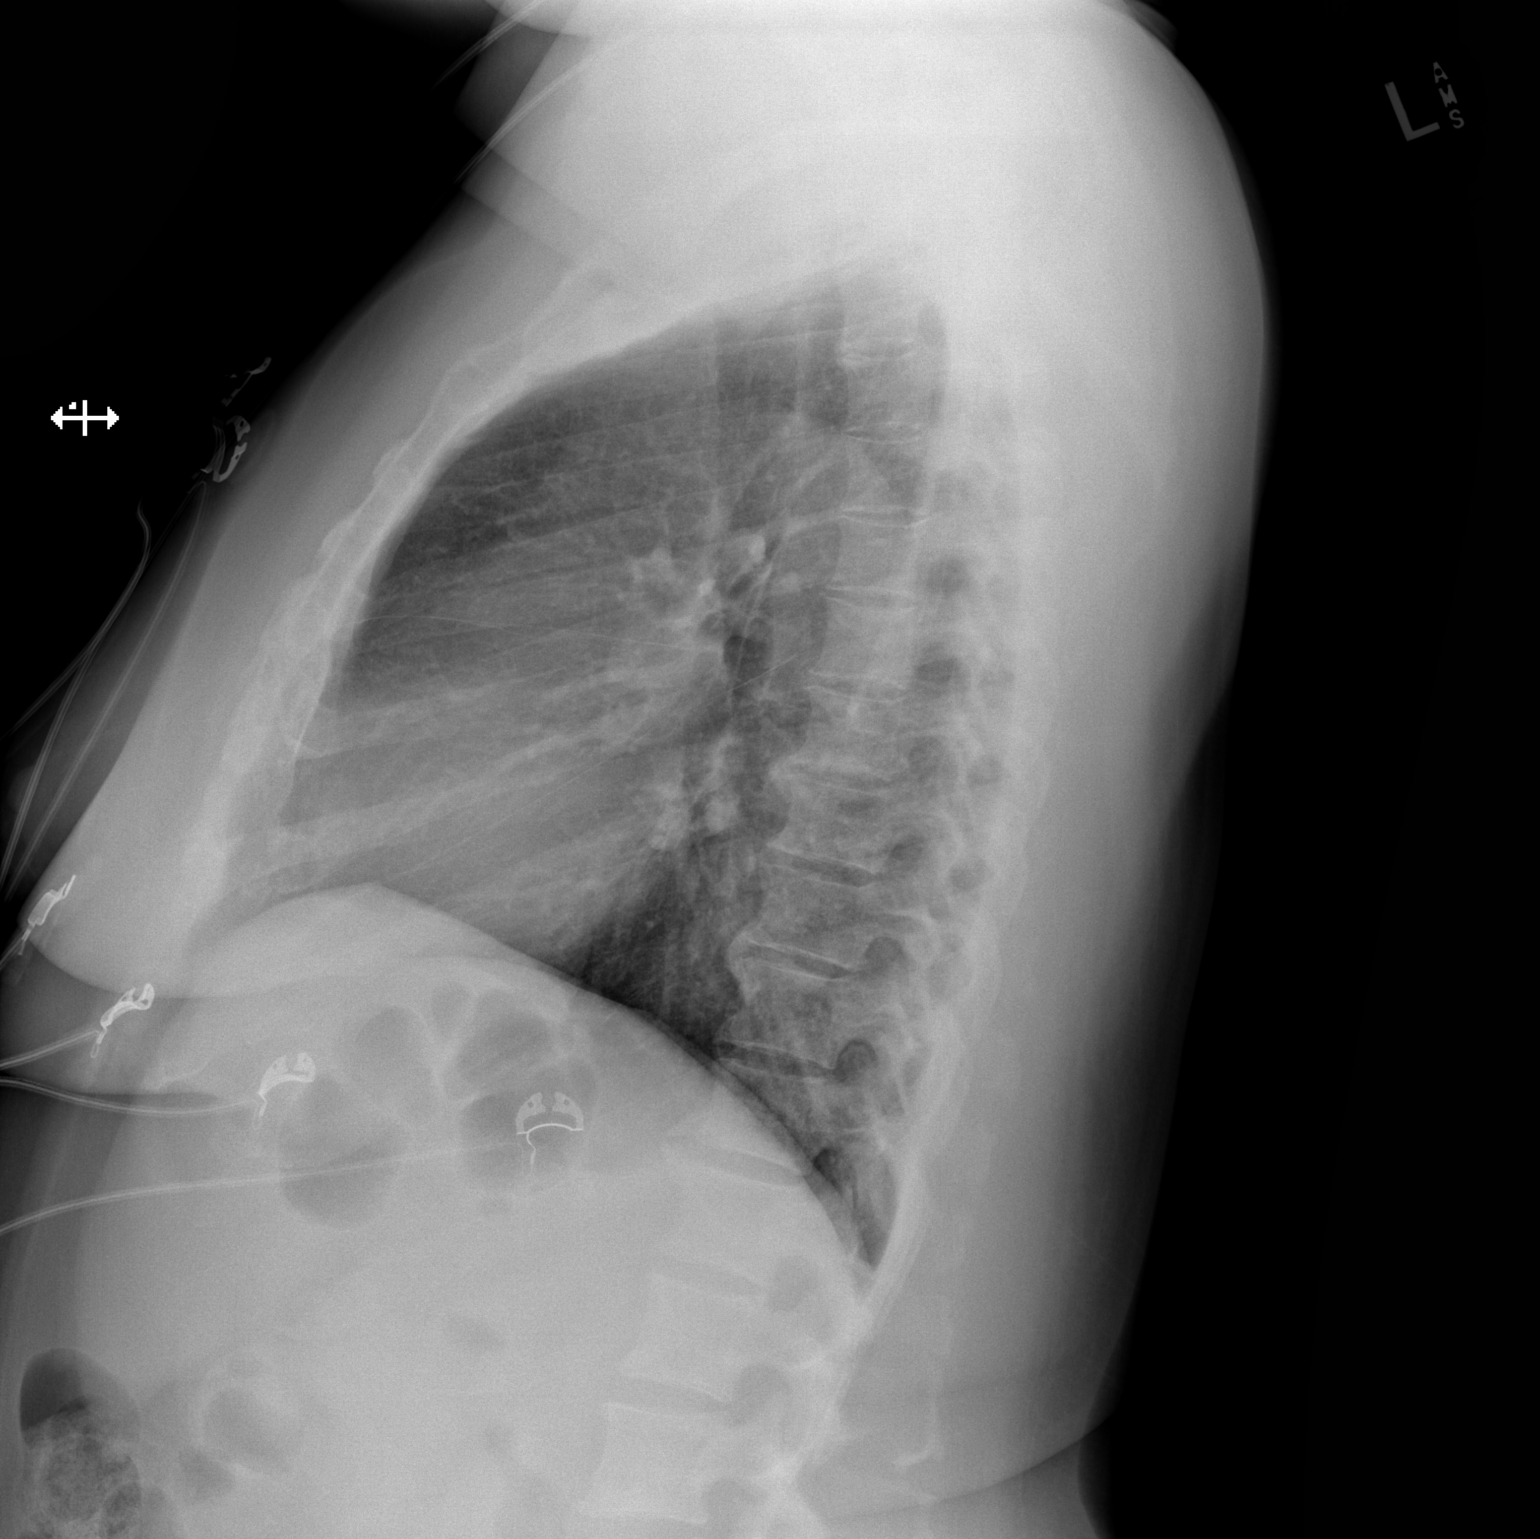

[2 of 2 positions shown; findings below may reference images not displayed]

FINDINGS: Heart and mediastinal contours are within normal limits. No focal
opacities or effusions. No acute bony abnormality.
IMPRESSION: No active cardiopulmonary disease.

## 2020-02-15 LAB — LIPID PANEL
Chol/HDL Ratio: 2.7 ratio (ref 0.0–4.4)
Cholesterol, Total: 138 mg/dL (ref 100–199)
HDL: 52 mg/dL (ref >39)
LDL Chol Calc (NIH): 69 mg/dL (ref 0–99)
Triglycerides: 93 mg/dL (ref 0–149)
VLDL Cholesterol Cal: 17 mg/dL (ref 5–40)

## 2020-02-21 ENCOUNTER — Encounter: Payer: Self-pay | Admitting: Cardiology

## 2020-02-21 ENCOUNTER — Ambulatory Visit: Payer: PRIVATE HEALTH INSURANCE | Admitting: Cardiology

## 2020-02-21 ENCOUNTER — Other Ambulatory Visit: Payer: Self-pay

## 2020-02-21 VITALS — BP 126/78 | HR 81 | Ht 66.0 in | Wt 204.8 lb

## 2020-02-21 DIAGNOSIS — F17200 Nicotine dependence, unspecified, uncomplicated: Secondary | ICD-10-CM

## 2020-02-21 DIAGNOSIS — E785 Hyperlipidemia, unspecified: Secondary | ICD-10-CM | POA: Diagnosis not present

## 2020-02-21 DIAGNOSIS — I25119 Atherosclerotic heart disease of native coronary artery with unspecified angina pectoris: Secondary | ICD-10-CM | POA: Diagnosis not present

## 2020-02-21 DIAGNOSIS — I10 Essential (primary) hypertension: Secondary | ICD-10-CM | POA: Diagnosis not present

## 2020-02-21 DIAGNOSIS — E1169 Type 2 diabetes mellitus with other specified complication: Secondary | ICD-10-CM

## 2020-02-21 DIAGNOSIS — Z955 Presence of coronary angioplasty implant and graft: Secondary | ICD-10-CM

## 2020-02-21 DIAGNOSIS — I214 Non-ST elevation (NSTEMI) myocardial infarction: Secondary | ICD-10-CM

## 2020-02-21 DIAGNOSIS — E119 Type 2 diabetes mellitus without complications: Secondary | ICD-10-CM

## 2020-02-21 NOTE — Progress Notes (Deleted)
PCP: Leonard Downing, MD  Clinic Note: No chief complaint on file.   HPI: Rebecca Cuevas is a 59 y.o. female with a PMH of Inferior NSTEMI in Aug 2019 - PCI RCA who presents today for annual f/u. Marland Kitchen  Rebecca Cuevas was last seen in September 2020  doing okay overall from a cardiac standpoint.  She still smokes about 10 cigarettes a day, but this is much less than she used to smoke.  September 2019 by Jory Sims, NP.  Plan was to follow-up in 6 months, however this was delayed due to COVID-19 lockdown.  No major complaints.  Doing well. -->" About 2-week follow-up to her initial hospital follow-up when she noted elevated blood pressures.  Decided to continue to stay off of ARB because of hypotension in the hospital.  Blood pressure went up when she smoked extra cigarettes.  Recent Hospitalizations: n/a  Studies Personally Reviewed - (if available, images/films reviewed: From Epic Chart or Care Everywhere) - 08/09/2018    Interval History: Rebecca Cuevas returns here today for    annual follow-up really stating that she is doing okay overall from a cardiac standpoint.  She still smokes about 10 cigarettes a day, but this is much less than she used to smoke.  She is due to see her PCP soon.  She said she walks a lot all day long with work.  She says at the end of day her feet hurt, but no real significant swelling.  She has intermittent chest discomfort off and on, right now is a sharp stabbing type pain on the left side of her central chest wall, but this is nothing like her anginal type pain when she had her MI.  She is not having any exertional chest pain.  No resting exertional dyspnea.  No heart failure symptoms of PND, orthopnea or edema. No palpitations, lightheadedness, dizziness, weakness or syncope/near syncope. No TIA/amaurosis fugax symptoms. No melena, hematochezia, hematuria, or epstaxis. No claudication. R 2-3 costo-sternal pain - reproducible   ROS: A  comprehensive was performed. Review of Systems  Constitutional: Negative for malaise/fatigue and weight loss (Actually has gained weight since the COVID-19 lockdown).  HENT: Negative for congestion and nosebleeds.   Respiratory: Positive for cough (Mild daily smoker's cough). Negative for shortness of breath.   Cardiovascular: Positive for chest pain (Central chest wall pain).  Gastrointestinal: Positive for heartburn. Negative for constipation and nausea.  Genitourinary: Negative for hematuria.  Musculoskeletal: Negative for joint pain (May have some off and on arthritis pains in her knees and hips).  Neurological: Negative.  Negative for dizziness, focal weakness, weakness and headaches.  Psychiatric/Behavioral: Negative for depression and memory loss. The patient is not nervous/anxious and does not have insomnia.   All other systems reviewed and are negative.   The patient does not have symptoms concerning for COVID-19 infection (fever, chills, cough, or new shortness of breath).  The patient is practicing social distancing.   COVID-19 Education: The signs and symptoms of COVID-19 were discussed with the patient and how to seek care for testing (follow up with PCP or arrange E-visit).   The importance of social distancing was discussed today.   I have reviewed and (if needed) personally updated the patient's problem list, medications, allergies, past medical and surgical history, social and family history.   Past Medical History:  Diagnosis Date  . CAD S/P DES PCI 08/09/2018   Thrombectomy & DES PCI of pRCA 99% - Synergy DES 3.5 x 20.  Marland Kitchen  Diabetes mellitus without complication ([place])   . H/O non-ST elevation myocardial infarction (NSTEMI) 08/09/2018   99% thrombotic pRCA --> thrombectomy & DES PCI (overnight aggrastat for distal embolization to RPAV) -  Lv Gram EF 50-55% with Inferior HK -> f/u Echo with Hyperdynamic LV, EF 65-70% & no RWMA.  . Hypertension     Past Surgical  History:  Procedure Laterality Date  . ABDOMINAL HYSTERECTOMY    . CESAREAN SECTION    . CORONARY STENT INTERVENTION N/A 08/09/2018   Procedure: CORONARY STENT INTERVENTION;  Surgeon: Wellington Hampshire, MD;  Location: [facility];  Service: Cardiovascular;  prox RCA 99% (thrombotic) stenosis (DES PCI with distal emobolization to rPAV; Synergy DES 3.5 x 20), -> rPAV 80% (distal embolization)  . HERNIA REPAIR    . LEFT HEART CATH AND CORONARY ANGIOGRAPHY N/A 08/09/2018   Procedure: LEFT HEART CATH AND CORONARY ANGIOGRAPHY;  Surgeon: Wellington Hampshire, MD;  Location: [facility];  prox RCA 99% (thrombotic) stenosis (DES PCI with distal emobolization to rPAV; Synergy DES), -> rPAV 80% (distal embolization). p-mCx 30%. p-m LAD 40%. EF 50-55% w/ Inferior HK.  Marland Kitchen TRANSTHORACIC ECHOCARDIOGRAM  08/10/2018   post Inf NSTEMI --> Mild concentric LVH with vigorous function.  EF 65-70%.  No RWMA.  GR 1 DD.    Cath-PCI: prox RCA 99% (thrombotic) stenosis (DES PCI with distal emobolization to rPAV; Synergy DES 3.5 x 20), -> rPAV 80% (distal embolization). p-mCx 30%. p-m LAD 40%. EF 50-55% w/ Inferior HK.  Echo 08/09/2018: Mild concentric LVH with vigorous function.  EF 65-70%.  No RWMA.  GR 1 DD.     Current Meds  Medication Sig  . atorvastatin (LIPITOR) 80 MG tablet Take 1/2 (one-half) tablet by mouth once daily  . Blood Glucose Monitoring Suppl (BAYER CONTOUR NEXT MONITOR) w/Device KIT 1 Device by Does not apply route 2 (two) times daily.  . carvedilol (COREG) 6.25 MG tablet TAKE 1 TABLET BY MOUTH TWICE DAILY WITH A MEAL  . clotrimazole-betamethasone (LOTRISONE) cream Apply 1 application topically 2 (two) times daily.  Marland Kitchen glucose blood (BAYER CONTOUR NEXT TEST) test strip Use as instructed  . hydrocortisone cream 1 % APPLY ONE APPLICATION TOPICALLY ONCE DAILY AS NEEDED FOR ITCHING (FOR ECZEMA).  Elmore Guise Devices (MICROLET NEXT LANCING DEVICE) MISC 1 Device by Does not apply route 2 (two) times  daily.  . metFORMIN (GLUCOPHAGE) 500 MG tablet TAKE 1 TABLET BY MOUTH TWICE DAILY WITH MEALS  . Multiple Vitamins-Minerals (COMPLETE MULTIVITAMIN/MINERAL PO) Take by mouth.  . nitroGLYCERIN (NITROSTAT) 0.4 MG SL tablet Place 1 tablet (0.4 mg total) under the tongue every 5 (five) minutes x 3 doses as needed for chest pain.  . ticagrelor (BRILINTA) 60 MG TABS tablet Take 1 tablet (60 mg total) by mouth 2 (two) times daily.  . vitamin B-12 (CYANOCOBALAMIN) 500 MCG tablet Take 500 mcg by mouth daily.  . vitamin C (ASCORBIC ACID) 500 MG tablet Take 500 mg by mouth daily.    Allergies  Allergen Reactions  . Contrast Media [Iodinated Diagnostic Agents] Hives, Shortness Of Breath and Itching    Pt broke out in large hives on her face after CT scan with contrast injection. Pt became slightly more short of breath than she was already after scan as well. Pt needs pre-meds prior to future studies.     Social History   Tobacco Use  . Smoking status: Current Every Day Smoker    Packs/day: 0.50  . Smokeless tobacco:  Never Used  . Tobacco comment: 8 cigs a day  Substance Use Topics  . Alcohol use: Yes    Alcohol/week: 0.0 standard drinks    Comment: occasional  . Drug use: No   Social History   Social History Narrative  . Not on file    family history includes Diabetes in her father; Hypertension in her mother.  Wt Readings from Last 3 Encounters:  02/21/20 204 lb 12.8 oz (92.9 kg)  08/24/19 201 lb 3.2 oz (91.3 kg)  09/04/18 194 lb 12.8 oz (88.4 kg)    PHYSICAL EXAM BP 126/78   Pulse 81   Ht '5\' 6"'  (1.676 m)   Wt 204 lb 12.8 oz (92.9 kg)   BMI 33.06 kg/m  Physical Exam  Constitutional: She is oriented to person, place, and time. She appears well-developed and well-nourished. No distress.  Well-groomed.  Healthy-appearing.  Does smell like cigarettes  HENT:  Head: Normocephalic and atraumatic.  Neck: No hepatojugular reflux and no JVD present. Carotid bruit is not present.   Cardiovascular: Normal rate, regular rhythm and normal pulses.  No extrasystoles are present. PMI is not displaced. Exam reveals distant heart sounds. Exam reveals no gallop and no friction rub.  No murmur heard. Pulmonary/Chest: Effort normal. No respiratory distress. She has no wheezes. She has no rales. She exhibits tenderness (Point tenderness along the left second and third costochondral joint.).  Mostly normal breath sounds with mild interstitial sounds.  Abdominal: Soft. Bowel sounds are normal. She exhibits no distension. There is no abdominal tenderness. There is no rebound.  Musculoskeletal:        General: No edema. Normal range of motion.     Cervical back: Normal range of motion and neck supple.  Neurological: She is alert and oriented to person, place, and time.  Psychiatric: She has a normal mood and affect. Her behavior is normal. Judgment and thought content normal.  Vitals reviewed.     Adult ECG Report  Rate: 81 ;  Rhythm: normal sinus rhythm and normal axis, intervals and durations;   Narrative Interpretation: Stable EKG  Other studies Reviewed: Additional studies/ records that were reviewed today include:  Recent Labs: Lab Results  Component Value Date   CHOL 138 02/15/2020   HDL 52 02/15/2020   LDLCALC 69 02/15/2020   TRIG 93 02/15/2020   CHOLHDL 2.7 02/15/2020   Lab Results  Component Value Date   CREATININE 0.82 09/04/2019   BUN 9 09/04/2019   NA 143 09/04/2019   K 4.8 09/04/2019   CL 107 (H) 09/04/2019   CO2 23 09/04/2019   Lab Results  Component Value Date   ALT 27 09/04/2019   AST 26 09/04/2019   ALKPHOS 135 (H) 09/04/2019   BILITOT 0.2 09/04/2019   CBC Latest Ref Rng & Units 08/22/2018 08/11/2018 08/10/2018  WBC 3.4 - 10.8 x10E3/uL 8.8 12.4(H) 17.8(H)  Hemoglobin 11.1 - 15.9 g/dL 11.5 11.5(L) 11.4(L)  Hematocrit 34.0 - 46.6 % 34.8 36.5 35.4(L)  Platelets 150 - 450 x10E3/uL 305 223 243   Lab Results  Component Value Date   HGBA1C 6.6 (H)  08/09/2018     ASSESSMENT / PLAN: Problem List Items Addressed This Visit    None       I spent a total of 25 minutes with the patient and chart review. >  50% of the time was spent in direct patient consultation.   Current medicines are reviewed at length with the patient today.  (+/- concerns) n/a The  following changes have been made:  n/a  There are no Patient Instructions on file for this visit.   Studies Ordered:   No orders of the defined types were placed in this encounter.     Glenetta Hew, M.D., M.S. Interventional Cardiologist   Pager # [phone] Phone # [phone] [address]. [place], [place] [address]   Thank you for choosing [facility] at [facility]!!

## 2020-02-21 NOTE — Patient Instructions (Addendum)
Medication Instructions:  No changes *If you need a refill on your cardiac medications before your next appointment, please call your pharmacy*   Lab Work: Not  needed   Testing/Procedures: Not needed  Follow-Up: At [facility], you and your health needs are our priority.  As part of our continuing mission to provide you with exceptional heart care, we have created designated Provider Care Teams.  These Care Teams include your primary Cardiologist (physician) and Advanced Practice Providers (APPs -  Physician Assistants and Nurse Practitioners) who all work together to provide you with the care you need, when you need it.  We recommend signing up for the patient portal called "MyChart".  Sign up information is provided on this After Visit Summary.  MyChart is used to connect with patients for Virtual Visits (Telemedicine).  Patients are able to view lab/test results, encounter notes, upcoming appointments, etc.  Non-urgent messages can be sent to your provider as well.   To learn more about what you can do with MyChart, go to [email or web address].    Your next appointment:   6 month(s)  The format for your next appointment:   In Person  Provider:   Glenetta Hew, MD   Other Instructions n/a

## 2020-02-24 ENCOUNTER — Encounter: Payer: Self-pay | Admitting: Cardiology

## 2020-02-24 NOTE — Assessment & Plan Note (Signed)
Pretty significant disease noted in the RCA with lots of thrombus in distalization.  Thankfully, maintained stable EF by echo with no significant wall motion normality.  This would suggest that the distal embolization cleared up. No further anginal symptoms.  Has had some mild musculoskeletal type symptoms but not true anginal chest pain.  No CHF symptoms.

## 2020-02-24 NOTE — Assessment & Plan Note (Signed)
Blood pressure looks well controlled on current dose of carvedilol.  Never did get back on the losartan.  As her pressure seems to be doing well, we will simply hold off at this point.

## 2020-02-24 NOTE — Assessment & Plan Note (Signed)
No further anginal symptoms since DES PCI to the RCA.  Most likely this normalization resolved. Remains on maintenance dose of Brilinta (which can be held for procedures).  Is on stable dose of carvedilol along with atorvastatin at 40 mg daily.

## 2020-02-24 NOTE — Assessment & Plan Note (Addendum)
She is on Metformin, A1c is 6.6.  If more aggressive control is desired, would consider SGLT2 inhibitor or GLP-1 1 agonist for additional cardiovascular benefit.

## 2020-02-24 NOTE — Assessment & Plan Note (Signed)
DES PCI to the RCA: Over 1 and half years out. No longer on aspirin.  On maintenance dose Brilinta 60 mg twice daily.  Okay to hold Brilinta 5 to 7 days preop for any surgeries or procedures.

## 2020-02-24 NOTE — Assessment & Plan Note (Signed)
Clearly is very difficult for her to fully quit.  We talked about different ways of quitting.  She wants to not use nicotine patches etc..  Indicating that she feels like it is trading one crutch for another.  She also has heard that cigarette smoking is protective against Covid which seems unfounded.  I talked about cutting down 1 to 2 cigarettes each week.  The goal would be for her to be to be less than 5 cigarettes a day by time I see her back in 6 months.  At that point I think making the final jump to quitting is easier.  I did spend at least 4 to 5 minutes discussing the pros and cons of smoking and different options for quitting.

## 2020-02-24 NOTE — Assessment & Plan Note (Signed)
Lipids look pretty well controlled.  LDL was 69 on current dose of atorvastatin.  Continue to monitor.  Would like to be little more aggressive.  I think if she were to work on smoking cessation and also increase her level of exercise and weight loss, we can avoid additional medications.

## 2020-02-24 NOTE — Progress Notes (Signed)
Primary Care Provider: Leonard Downing, MD Cardiologist: Glenetta Hew, MD Electrophysiologist: None  Clinic Note: Chief Complaint  Patient presents with  . Follow-up    1 year  . Coronary Artery Disease    No major issues   HPI:    Rebecca Cuevas is a 59 y.o. female with a  PMH of Inferior NSTEMI in Aug 2019 - PCI RCA who presents today for annual f/u.   Rebecca Cuevas was last seen  in September 2020  doing okay overall from a cardiac standpoint.  She still smokes about 10 cigarettes a day, but this is much less than she used to smoke.  Noted that she walks a lot during work.  Intermittent chest discomfort off and on, but not like her angina.  More chest wall related.  Recent Hospitalizations: None  Reviewed  CV studies:    The following studies were reviewed today: (if available, images/films reviewed: From Epic Chart or Care Everywhere) . None:   Interval History:   Rebecca Cuevas presents here today overall doing well from cardiac standpoint.  She says sometimes she notes that her heart rate will get wrapped up when she gets upset or anxious.  She otherwise not had much of the chest discomfort symptoms that she had.  No chest pain or pressure with rest or exertion consistent with her prior anginal equivalent.  CV Review of Symptoms (Summary): no chest pain or dyspnea on exertion positive for - rapid heart rate and Short-lived spells with anxiety or emotional upset negative for - edema, irregular heartbeat, orthopnea, palpitations, paroxysmal nocturnal dyspnea, shortness of breath or Syncope/near syncope, TIA/amaurosis fugax; claudication   Still smoking about 10 cigarettes a day.  The patient does not have symptoms concerning for COVID-19 infection (fever, chills, cough, or new shortness of breath).  The patient is practicing social distancing & Masking.  She is wondering whether or not she should get her COVID-19 vaccine.   REVIEWED OF SYSTEMS   Review  of Systems  Constitutional: Negative for malaise/fatigue.  HENT: Negative for congestion and nosebleeds.   Gastrointestinal: Negative for blood in stool.  Genitourinary: Negative for hematuria.  Musculoskeletal: Negative for joint pain.  Neurological: Negative for dizziness and headaches.  Psychiatric/Behavioral: Negative for memory loss. The patient is not nervous/anxious and does not have insomnia.      I have reviewed and (if needed) personally updated the patient's problem list, medications, allergies, past medical and surgical history, social and family history.   PAST MEDICAL HISTORY   Past Medical History:  Diagnosis Date  . CAD S/P DES PCI 08/09/2018   Thrombectomy & DES PCI of pRCA 99% - Synergy DES 3.5 x 20.  . Diabetes mellitus without complication ([place])   . H/O non-ST elevation myocardial infarction (NSTEMI) 08/09/2018   99% thrombotic pRCA --> thrombectomy & DES PCI (overnight aggrastat for distal embolization to RPAV) -  Lv Gram EF 50-55% with Inferior HK -> f/u Echo with Hyperdynamic LV, EF 65-70% & no RWMA.  . Hypertension     PAST SURGICAL HISTORY   Past Surgical History:  Procedure Laterality Date  . ABDOMINAL HYSTERECTOMY    . CESAREAN SECTION    . CORONARY STENT INTERVENTION N/A 08/09/2018   Procedure: CORONARY STENT INTERVENTION;  Surgeon: Wellington Hampshire, MD;  Location: [facility];  Service: Cardiovascular;  prox RCA 99% (thrombotic) stenosis (DES PCI with distal emobolization to rPAV; Synergy DES 3.5 x 20), -> rPAV 80% (distal embolization)  . HERNIA REPAIR    .  LEFT HEART CATH AND CORONARY ANGIOGRAPHY N/A 08/09/2018   Procedure: LEFT HEART CATH AND CORONARY ANGIOGRAPHY;  Surgeon: Wellington Hampshire, MD;  Location: [facility];  prox RCA 99% (thrombotic) stenosis (DES PCI with distal emobolization to rPAV; Synergy DES), -> rPAV 80% (distal embolization). p-mCx 30%. p-m LAD 40%. EF 50-55% w/ Inferior HK.  Marland Kitchen TRANSTHORACIC ECHOCARDIOGRAM   08/10/2018   post Inf NSTEMI --> Mild concentric LVH with vigorous function.  EF 65-70%.  No RWMA.  GR 1 DD.   Cath PCI 08/09/2018: (thrombectomy & DES PCI pRCA) Synergy DES 3.5 x 20 (distal embolization to RPAV, overnight Aggrastat). EF 50-55% - inferior HK.    MEDICATIONS/ALLERGIES   Current Meds  Medication Sig  . atorvastatin (LIPITOR) 80 MG tablet Take 1/2 (one-half) tablet by mouth once daily  . Blood Glucose Monitoring Suppl (BAYER CONTOUR NEXT MONITOR) w/Device KIT 1 Device by Does not apply route 2 (two) times daily.  . carvedilol (COREG) 6.25 MG tablet TAKE 1 TABLET BY MOUTH TWICE DAILY WITH A MEAL  . clotrimazole-betamethasone (LOTRISONE) cream Apply 1 application topically 2 (two) times daily.  Marland Kitchen glucose blood (BAYER CONTOUR NEXT TEST) test strip Use as instructed  . hydrocortisone cream 1 % APPLY ONE APPLICATION TOPICALLY ONCE DAILY AS NEEDED FOR ITCHING (FOR ECZEMA).  Elmore Guise Devices (MICROLET NEXT LANCING DEVICE) MISC 1 Device by Does not apply route 2 (two) times daily.  . metFORMIN (GLUCOPHAGE) 500 MG tablet TAKE 1 TABLET BY MOUTH TWICE DAILY WITH MEALS  . Multiple Vitamins-Minerals (COMPLETE MULTIVITAMIN/MINERAL PO) Take by mouth.  . nitroGLYCERIN (NITROSTAT) 0.4 MG SL tablet Place 1 tablet (0.4 mg total) under the tongue every 5 (five) minutes x 3 doses as needed for chest pain.  . ticagrelor (BRILINTA) 60 MG TABS tablet Take 1 tablet (60 mg total) by mouth 2 (two) times daily.  . vitamin B-12 (CYANOCOBALAMIN) 500 MCG tablet Take 500 mcg by mouth daily.  . vitamin C (ASCORBIC ACID) 500 MG tablet Take 500 mg by mouth daily.    Allergies  Allergen Reactions  . Contrast Media [Iodinated Diagnostic Agents] Hives, Shortness Of Breath and Itching    Pt broke out in large hives on her face after CT scan with contrast injection. Pt became slightly more short of breath than she was already after scan as well. Pt needs pre-meds prior to future studies.     SOCIAL  HISTORY/FAMILY HISTORY   Reviewed in Epic:  Pertinent findings: Still not very successful with smoking cessation  OBJCTIVE -PE, EKG, labs   Wt Readings from Last 3 Encounters:  02/21/20 204 lb 12.8 oz (92.9 kg)  08/24/19 201 lb 3.2 oz (91.3 kg)  09/04/18 194 lb 12.8 oz (88.4 kg)    Physical Exam: BP 126/78   Pulse 81   Ht '5\' 6"'  (1.676 m)   Wt 204 lb 12.8 oz (92.9 kg)   BMI 33.06 kg/m  Physical Exam  Constitutional: She is oriented to person, place, and time. She appears well-developed and well-nourished. No distress.  Mildly obese.  Otherwise healthy-appearing.  Well-groomed.  HENT:  Head: Normocephalic and atraumatic.  Neck: No JVD present.  Cardiovascular: Normal rate, regular rhythm, normal heart sounds and intact distal pulses. Exam reveals no gallop and no friction rub.  No murmur heard. Pulmonary/Chest: Effort normal and breath sounds normal. No respiratory distress. She has no wheezes. She has no rales.  Abdominal: Soft. Bowel sounds are normal. She exhibits no distension. There is no abdominal tenderness. There  is no rebound.  Musculoskeletal:        General: No edema. Normal range of motion.     Cervical back: Normal range of motion and neck supple.  Neurological: She is alert and oriented to person, place, and time.  Psychiatric: She has a normal mood and affect. Her behavior is normal. Judgment and thought content normal.  Vitals reviewed.    Adult ECG Report  Rate: 81 ;  Rhythm: normal sinus rhythm and Normal axis, intervals and durations.;   Narrative Interpretation: Normal  Recent Labs:    Lab Results  Component Value Date   CHOL 138 02/15/2020   HDL 52 02/15/2020   LDLCALC 69 02/15/2020   TRIG 93 02/15/2020   CHOLHDL 2.7 02/15/2020   Lab Results  Component Value Date   CREATININE 0.82 09/04/2019   BUN 9 09/04/2019   NA 143 09/04/2019   K 4.8 09/04/2019   CL 107 (H) 09/04/2019   CO2 23 09/04/2019   Lab Results  Component Value Date   TSH  1.36 01/27/2017    ASSESSMENT/PLAN    Problem List Items Addressed This Visit    Diabetes mellitus type II, non insulin dependent ([place]) (Chronic)    She is on Metformin, A1c is 6.6.  If more aggressive control is desired, would consider SGLT2 inhibitor or GLP-1 1 agonist for additional cardiovascular benefit.      Hyperlipidemia associated with type 2 DM - Goal LDL < 70. (Chronic)    Lipids look pretty well controlled.  LDL was 69 on current dose of atorvastatin.  Continue to monitor.  Would like to be little more aggressive.  I think if she were to work on smoking cessation and also increase her level of exercise and weight loss, we can avoid additional medications.      Relevant Orders   EKG 12-Lead (Completed)   TOBACCO DEPENDENCE (Chronic)    Clearly is very difficult for her to fully quit.  We talked about different ways of quitting.  She wants to not use nicotine patches etc..  Indicating that she feels like it is trading one crutch for another.  She also has heard that cigarette smoking is protective against Covid which seems unfounded.  I talked about cutting down 1 to 2 cigarettes each week.  The goal would be for her to be to be less than 5 cigarettes a day by time I see her back in 6 months.  At that point I think making the final jump to quitting is easier.  I did spend at least 4 to 5 minutes discussing the pros and cons of smoking and different options for quitting.      Essential hypertension (Chronic)    Blood pressure looks well controlled on current dose of carvedilol.  Never did get back on the losartan.  As her pressure seems to be doing well, we will simply hold off at this point.      NSTEMI (non-ST elevated myocardial infarction) (HCC) (Chronic)    Pretty significant disease noted in the RCA with lots of thrombus in distalization.  Thankfully, maintained stable EF by echo with no significant wall motion normality.  This would suggest that the distal embolization  cleared up. No further anginal symptoms.  Has had some mild musculoskeletal type symptoms but not true anginal chest pain.  No CHF symptoms.      Coronary artery disease involving native coronary artery of native heart with angina pectoris ([place]) - Primary (Chronic)  No further anginal symptoms since DES PCI to the RCA.  Most likely this normalization resolved. Remains on maintenance dose of Brilinta (which can be held for procedures).  Is on stable dose of carvedilol along with atorvastatin at 40 mg daily.      Relevant Orders   EKG 12-Lead (Completed)   Presence of drug coated stent in right coronary artery (Chronic)    DES PCI to the RCA: Over 1 and half years out. No longer on aspirin.  On maintenance dose Brilinta 60 mg twice daily.  Okay to hold Brilinta 5 to 7 days preop for any surgeries or procedures.          COVID-19 Education: The signs and symptoms of COVID-19 were discussed with the patient and how to seek care for testing (follow up with PCP or arrange E-visit).   The importance of social distancing was discussed today.  I spent a total of 15 minutes with the patient. >  50% of the time was spent in direct patient consultation.  Additional time spent with chart review  / charting (studies, outside notes, etc): 8 Total Time: 21 min   Current medicines are reviewed at length with the patient today.  (+/- concerns) 23   Patient Instructions / Medication Changes & Studies & Tests Ordered   Patient Instructions  Medication Instructions:  No changes *If you need a refill on your cardiac medications before your next appointment, please call your pharmacy*   Lab Work: Not  needed   Testing/Procedures: Not needed  Follow-Up: At [facility], you and your health needs are our priority.  As part of our continuing mission to provide you with exceptional heart care, we have created designated Provider Care Teams.  These Care Teams include your primary  Cardiologist (physician) and Advanced Practice Providers (APPs -  Physician Assistants and Nurse Practitioners) who all work together to provide you with the care you need, when you need it.  We recommend signing up for the patient portal called "MyChart".  Sign up information is provided on this After Visit Summary.  MyChart is used to connect with patients for Virtual Visits (Telemedicine).  Patients are able to view lab/test results, encounter notes, upcoming appointments, etc.  Non-urgent messages can be sent to your provider as well.   To learn more about what you can do with MyChart, go to [email or web address].    Your next appointment:   6 month(s)  The format for your next appointment:   In Person  Provider:   Glenetta Hew, MD   Other Instructions n/a    Studies Ordered:   Orders Placed This Encounter  Procedures  . EKG 12-Lead     Glenetta Hew, M.D., M.S. Interventional Cardiologist   Pager # [phone] Phone # [phone] [address] [place], [place] [address]   Thank you for choosing [facility] at [facility]!!

## 2020-03-10 ENCOUNTER — Other Ambulatory Visit: Payer: Self-pay | Admitting: Physician Assistant

## 2020-03-10 NOTE — Telephone Encounter (Signed)
Rx request sent to pharmacy.  

## 2020-04-23 ENCOUNTER — Other Ambulatory Visit: Payer: Self-pay

## 2020-04-23 ENCOUNTER — Ambulatory Visit: Payer: PRIVATE HEALTH INSURANCE | Admitting: Podiatry

## 2020-04-23 DIAGNOSIS — B351 Tinea unguium: Secondary | ICD-10-CM | POA: Diagnosis not present

## 2020-04-23 DIAGNOSIS — L600 Ingrowing nail: Secondary | ICD-10-CM | POA: Diagnosis not present

## 2020-04-23 DIAGNOSIS — M79676 Pain in unspecified toe(s): Secondary | ICD-10-CM

## 2020-04-23 DIAGNOSIS — E0843 Diabetes mellitus due to underlying condition with diabetic autonomic (poly)neuropathy: Secondary | ICD-10-CM | POA: Diagnosis not present

## 2020-04-23 MED ORDER — GENTAMICIN SULFATE 0.1 % EX CREA: 1.0000 "application " | TOPICAL_CREAM | Freq: Two times a day (BID) | CUTANEOUS | 1 refills | Status: DC

## 2020-04-23 MED ORDER — BETAMETHASONE DIPROPIONATE 0.05 % EX CREA: TOPICAL_CREAM | Freq: Two times a day (BID) | CUTANEOUS | 2 refills | Status: AC

## 2020-04-27 NOTE — Progress Notes (Signed)
   Subjective: Patient presents today for evaluation of pain to the medial border of the right great toe that began 2-3 weeks ago. Patient is concerned for possible ingrown nail. Applying pressure to the nail increases the pain. She has been soaking the toe in Epsom salt for treatment.  She is also complaining of elongated, thickened nails that cause pain while ambulating in shoes. She is unable to trim her own nails. Patient is here for further evaluation and treatment.   Past Medical History:  Diagnosis Date  . CAD S/P DES PCI 08/09/2018   Thrombectomy & DES PCI of pRCA 99% - Synergy DES 3.5 x 20.  . Diabetes mellitus without complication ([place])   . H/O non-ST elevation myocardial infarction (NSTEMI) 08/09/2018   99% thrombotic pRCA --> thrombectomy & DES PCI (overnight aggrastat for distal embolization to RPAV) -  Lv Gram EF 50-55% with Inferior HK -> f/u Echo with Hyperdynamic LV, EF 65-70% & no RWMA.  . Hypertension     Objective:  General: Well developed, nourished, in no acute distress, alert and oriented x3   Dermatology: Skin is warm, dry and supple bilateral. Medial border of the right great toe appears to be erythematous with evidence of an ingrowing nail. Pain on palpation noted to the border of the nail fold. Nails are tender, long, thickened and dystrophic with subungual debris, consistent with onychomycosis, 1-5 bilateral. There are no open sores, lesions.  Vascular: Dorsalis Pedis artery and Posterior Tibial artery pedal pulses palpable. No lower extremity edema noted.   Neruologic: Grossly intact via light touch bilateral.  Musculoskeletal: Muscular strength within normal limits in all groups bilateral. Normal range of motion noted to all pedal and ankle joints.   Assesement: #1 Paronychia with ingrowing nail medial border right hallux  #2 Pain in toe #3 Incurvated nail #4 Onychomycosis of nail due to dermatophyte bilateral  Plan of Care:  1. Patient evaluated.  2.  Discussed treatment alternatives and plan of care. Explained nail avulsion procedure and post procedure course to patient. 3. Patient opted for permanent partial nail avulsion of the medial border of the right hallux.  4. Prior to procedure, local anesthesia infiltration utilized using 3 ml of a 50:50 mixture of 2% plain lidocaine and 0.5% plain marcaine in a normal hallux block fashion and a betadine prep performed.  5. Partial permanent nail avulsion with chemical matrixectomy performed using [id number] applications of phenol followed by alcohol flush.  6. Light dressing applied. 7. Mechanical debridement of nails 1-5 bilaterally performed using a nail nipper. Filed with dremel without incident.  8. Prescription for Gentamicin cream provided to patient to use daily with a bandage.  9. Return to clinic in 2 weeks.  Edrick Kins, DPM [facility]  Dr. Edrick Kins, [address]                                        [place], [place] [address]                Office [phone]  Fax [phone]

## 2020-05-07 ENCOUNTER — Other Ambulatory Visit: Payer: Self-pay

## 2020-05-07 ENCOUNTER — Encounter: Payer: Self-pay | Admitting: Podiatry

## 2020-05-07 ENCOUNTER — Ambulatory Visit (INDEPENDENT_AMBULATORY_CARE_PROVIDER_SITE_OTHER): Payer: PRIVATE HEALTH INSURANCE | Admitting: Podiatry

## 2020-05-07 DIAGNOSIS — E0843 Diabetes mellitus due to underlying condition with diabetic autonomic (poly)neuropathy: Secondary | ICD-10-CM | POA: Diagnosis not present

## 2020-05-07 DIAGNOSIS — L600 Ingrowing nail: Secondary | ICD-10-CM | POA: Diagnosis not present

## 2020-05-07 DIAGNOSIS — L989 Disorder of the skin and subcutaneous tissue, unspecified: Secondary | ICD-10-CM

## 2020-05-09 NOTE — Progress Notes (Signed)
   Subjective: 59 y.o. female presents today status post permanent nail avulsion procedure of the medial border of the right great toe that was performed on 04/23/2020. She reports some mild soreness. She has been applying Peroxide and Gentamicin cream to the toe. She has not been soaking the toe as instructed. There are no worsening factors noted. Patient is here for further evaluation and treatment.    Past Medical History:  Diagnosis Date  . CAD S/P DES PCI 08/09/2018   Thrombectomy & DES PCI of pRCA 99% - Synergy DES 3.5 x 20.  . Diabetes mellitus without complication ([place])   . H/O non-ST elevation myocardial infarction (NSTEMI) 08/09/2018   99% thrombotic pRCA --> thrombectomy & DES PCI (overnight aggrastat for distal embolization to RPAV) -  Lv Gram EF 50-55% with Inferior HK -> f/u Echo with Hyperdynamic LV, EF 65-70% & no RWMA.  . Hypertension     Objective: Skin is warm, dry and supple. Nail and respective nail fold appears to be healing appropriately. Open wound to the associated nail fold with a granular wound base and moderate amount of fibrotic tissue. Minimal drainage noted. Mild erythema around the periungual region likely due to phenol chemical matricectomy. Hyperkeratotic lesion(s) present on the bilateral feet. Pain on palpation with a central nucleated core noted.   Assessment: #1 postop permanent partial nail avulsion medial border right hallux #2 open wound periungual nail fold of respective digit.  #3 Pre-ulcerative callus lesions noted to the bilateral feet x 3  Plan of care: #1 patient was evaluated  #2 debridement of open wound was performed to the periungual border of the respective toe using a currette. Antibiotic ointment and Band-Aid was applied. #3 Excisional debridement of keratotic lesion(s) using a chisel blade was performed without incident. Light dressing applied.  #4 patient is to return to clinic on a PRN basis.   Edrick Kins, DPM [facility]  Dr. Edrick Kins, [place]                                        [place], [place] [address]                Office [phone]  Fax [phone]

## 2020-06-12 ENCOUNTER — Encounter (HOSPITAL_COMMUNITY): Payer: Self-pay | Admitting: Emergency Medicine

## 2020-06-12 ENCOUNTER — Emergency Department (HOSPITAL_COMMUNITY)
Admission: EM | Admit: 2020-06-12 | Discharge: 2020-06-12 | Disposition: A | Discharge status: Home / Self Care | Payer: PRIVATE HEALTH INSURANCE | Source: Home / Self Care | Attending: Emergency Medicine | Admitting: Emergency Medicine

## 2020-06-12 ENCOUNTER — Other Ambulatory Visit: Payer: Self-pay

## 2020-06-12 ENCOUNTER — Emergency Department (HOSPITAL_COMMUNITY): Payer: PRIVATE HEALTH INSURANCE

## 2020-06-12 ENCOUNTER — Encounter (HOSPITAL_COMMUNITY): Payer: Self-pay

## 2020-06-12 ENCOUNTER — Emergency Department (HOSPITAL_COMMUNITY)
Admission: EM | Admit: 2020-06-12 | Discharge: 2020-06-12 | Disposition: A | Discharge status: Home / Self Care | Payer: PRIVATE HEALTH INSURANCE | Attending: Emergency Medicine | Admitting: Emergency Medicine

## 2020-06-12 DIAGNOSIS — E119 Type 2 diabetes mellitus without complications: Secondary | ICD-10-CM | POA: Diagnosis not present

## 2020-06-12 DIAGNOSIS — I10 Essential (primary) hypertension: Secondary | ICD-10-CM | POA: Diagnosis not present

## 2020-06-12 DIAGNOSIS — Z9861 Coronary angioplasty status: Secondary | ICD-10-CM | POA: Insufficient documentation

## 2020-06-12 DIAGNOSIS — Z5321 Procedure and treatment not carried out due to patient leaving prior to being seen by health care provider: Secondary | ICD-10-CM | POA: Insufficient documentation

## 2020-06-12 DIAGNOSIS — Z79899 Other long term (current) drug therapy: Secondary | ICD-10-CM | POA: Diagnosis not present

## 2020-06-12 DIAGNOSIS — I2510 Atherosclerotic heart disease of native coronary artery without angina pectoris: Secondary | ICD-10-CM | POA: Diagnosis not present

## 2020-06-12 DIAGNOSIS — R0789 Other chest pain: Secondary | ICD-10-CM | POA: Diagnosis not present

## 2020-06-12 DIAGNOSIS — R079 Chest pain, unspecified: Secondary | ICD-10-CM

## 2020-06-12 DIAGNOSIS — F17210 Nicotine dependence, cigarettes, uncomplicated: Secondary | ICD-10-CM | POA: Insufficient documentation

## 2020-06-12 DIAGNOSIS — I252 Old myocardial infarction: Secondary | ICD-10-CM | POA: Insufficient documentation

## 2020-06-12 LAB — BASIC METABOLIC PANEL
Anion gap: 8 (ref 5–15)
BUN: 10 mg/dL (ref 6–20)
CO2: 23 mmol/L (ref 22–32)
Calcium: 8.8 mg/dL — ABNORMAL LOW (ref 8.9–10.3)
Chloride: 110 mmol/L (ref 98–111)
Creatinine, Ser: 0.89 mg/dL (ref 0.44–1.00)
GFR calc Af Amer: >60 mL/min (ref >60)
GFR calc non Af Amer: >60 mL/min (ref >60)
Glucose, Bld: 119 mg/dL — ABNORMAL HIGH (ref 70–99)
Potassium: 3.7 mmol/L (ref 3.5–5.1)
Sodium: 141 mmol/L (ref 135–145)

## 2020-06-12 LAB — CBC
HCT: 36.7 % (ref 36.0–46.0)
Hemoglobin: 11.4 g/dL — ABNORMAL LOW (ref 12.0–15.0)
MCH: 26.5 pg (ref 26.0–34.0)
MCHC: 31.1 g/dL (ref 30.0–36.0)
MCV: 85.2 fL (ref 80.0–100.0)
Platelets: 219 10*3/uL (ref 150–400)
RBC: 4.31 MIL/uL (ref 3.87–5.11)
RDW: 15 % (ref 11.5–15.5)
WBC: 8.3 10*3/uL (ref 4.0–10.5)
nRBC: 0 % (ref 0.0–0.2)

## 2020-06-12 LAB — TROPONIN I (HIGH SENSITIVITY)
Troponin I (High Sensitivity): 5 ng/L (ref <18)
Troponin I (High Sensitivity): 4 ng/L (ref <18)

## 2020-06-12 MED ORDER — SODIUM CHLORIDE 0.9% FLUSH: 3.0000 mL | Freq: Once | INTRAVENOUS | Status: DC

## 2020-06-12 MED ORDER — CARVEDILOL 3.125 MG PO TABS: 6.2500 mg | ORAL_TABLET | Freq: Two times a day (BID) | ORAL | Status: DC

## 2020-06-12 MED ORDER — CARVEDILOL 3.125 MG PO TABS
6.2500 mg | ORAL_TABLET | Freq: Two times a day (BID) | ORAL | Status: DC
  Administered 2020-06-12: 6.25 mg via ORAL
  Filled 2020-06-12: qty 2

## 2020-06-12 NOTE — ED Provider Notes (Signed)
[place] DEPT Provider Note   CSN: [id number] Arrival date & time: 06/12/20  1735     History Chief Complaint  Patient presents with  . Chest Pain    Rebecca Cuevas is a 59 y.o. female.  The history is provided by the patient and medical records. No language interpreter was used.  Chest Pain  Rebecca Cuevas is a 59 y.o. female who presents to the Emergency Department complaining of chest pain yesterday. She complains of waxing and waning central chest pain. Pain is described as sharp in nature. There are no clear alleviating or worsening factors in pain is not related lively reproducible with activity. Pain was worse yesterday when compared to today. Today she saw her PCP, who performed an EKG and recommended that she go to the emergency department for evaluation. She went to the [facility] emergency department but left due to long wait. She did have labs drawn and an x-ray performed at that department. She denies any associated diaphoresis, nausea, vomiting, abdominal pain, shortness of breath, leg swelling or pain. She does have a history of coronary artery disease and is status post stent placement. She is compliant with all her medications.    Past Medical History:  Diagnosis Date  . CAD S/P DES PCI 08/09/2018   Thrombectomy & DES PCI of pRCA 99% - Synergy DES 3.5 x 20.  . Diabetes mellitus without complication ([place])   . H/O non-ST elevation myocardial infarction (NSTEMI) 08/09/2018   99% thrombotic pRCA --> thrombectomy & DES PCI (overnight aggrastat for distal embolization to RPAV) -  Lv Gram EF 50-55% with Inferior HK -> f/u Echo with Hyperdynamic LV, EF 65-70% & no RWMA.  . Hypertension     Patient Active Problem List   Diagnosis Date Noted  . Coronary artery disease involving native coronary artery of native heart with angina pectoris ([place]) 08/24/2019  . Presence of drug coated stent in right coronary artery 08/24/2019  .  Hyperlipidemia associated with type 2 DM - Goal LDL < 70. 08/24/2019  . NSTEMI (non-ST elevated myocardial infarction) ([place]) 08/09/2018  . Callus of foot 02/04/2017  . Diabetes mellitus type II, non insulin dependent ([place]) 11/22/2016  . Thoracic back pain 11/22/2016  . Tachycardia 04/29/2014  . Arthritis 08/23/2011  . Obesity, unspecified 04/29/2009  . Essential hypertension 04/29/2009  . TOBACCO DEPENDENCE 02/09/2007  . Eczema 02/09/2007    Past Surgical History:  Procedure Laterality Date  . ABDOMINAL HYSTERECTOMY    . CESAREAN SECTION    . CORONARY STENT INTERVENTION N/A 08/09/2018   Procedure: CORONARY STENT INTERVENTION;  Surgeon: Wellington Hampshire, MD;  Location: [place] [facility];  Service: Cardiovascular;  prox RCA 99% (thrombotic) stenosis (DES PCI with distal emobolization to rPAV; Synergy DES 3.5 x 20), -> rPAV 80% (distal embolization)  . HERNIA REPAIR    . LEFT HEART CATH AND CORONARY ANGIOGRAPHY N/A 08/09/2018   Procedure: LEFT HEART CATH AND CORONARY ANGIOGRAPHY;  Surgeon: Wellington Hampshire, MD;  Location: [facility];  prox RCA 99% (thrombotic) stenosis (DES PCI with distal emobolization to rPAV; Synergy DES), -> rPAV 80% (distal embolization). p-mCx 30%. p-m LAD 40%. EF 50-55% w/ Inferior HK.  Marland Kitchen TRANSTHORACIC ECHOCARDIOGRAM  08/10/2018   post Inf NSTEMI --> Mild concentric LVH with vigorous function.  EF 65-70%.  No RWMA.  GR 1 DD.     OB History   No obstetric history on file.     Family History  Problem Relation  Age of Onset  . Hypertension Mother   . Diabetes Father   . Colon cancer Neg Hx     Social History   Tobacco Use  . Smoking status: Current Every Day Smoker    Packs/day: 0.50  . Smokeless tobacco: Never Used  . Tobacco comment: 8 cigs a day  Vaping Use  . Vaping Use: Never used  Substance Use Topics  . Alcohol use: Yes    Alcohol/week: 0.0 standard drinks    Comment: occasional  . Drug use: No    Home Medications Prior to  Admission medications   Medication Sig Start Date End Date Taking? Authorizing Provider  atorvastatin (LIPITOR) 80 MG tablet Take 1/2 (one-half) tablet by mouth once daily Patient taking differently: Take 40 mg by mouth daily.  09/28/19  Yes Barrett, Evelene Croon, PA-C  betamethasone dipropionate 0.05 % cream Apply topically 2 (two) times daily. 04/23/20  Yes Edrick Kins, DPM  Blood Glucose Monitoring Suppl (BAYER CONTOUR NEXT MONITOR) w/Device KIT 1 Device by Does not apply route 2 (two) times daily. 02/24/17  Yes Donnamae Jude, MD  carvedilol (COREG) 6.25 MG tablet TAKE 1 TABLET BY MOUTH TWICE DAILY WITH A MEAL Patient taking differently: Take 6.25 mg by mouth 2 (two) times daily with a meal.  08/10/19  Yes Leonie Man, MD  clotrimazole-betamethasone (LOTRISONE) cream Apply 1 application topically 2 (two) times daily. 08/29/19  Yes Edrick Kins, DPM  gentamicin cream (GARAMYCIN) 0.1 % Apply 1 application topically 2 (two) times daily. 04/23/20  Yes Edrick Kins, DPM  glucose blood (BAYER CONTOUR NEXT TEST) test strip Use as instructed 02/24/17  Yes Donnamae Jude, MD  hydrocortisone cream 1 % APPLY ONE APPLICATION TOPICALLY ONCE DAILY AS NEEDED FOR ITCHING (FOR ECZEMA). 05/19/16  Yes Donnamae Jude, MD  Lancet Devices (MICROLET NEXT LANCING DEVICE) MISC 1 Device by Does not apply route 2 (two) times daily. 02/24/17  Yes Donnamae Jude, MD  metFORMIN (GLUCOPHAGE) 500 MG tablet TAKE 1 TABLET BY MOUTH TWICE DAILY WITH MEALS 09/28/19  Yes Donnamae Jude, MD  Multiple Vitamins-Minerals (COMPLETE MULTIVITAMIN/MINERAL PO) Take by mouth.   Yes [provider]  nitroGLYCERIN (NITROSTAT) 0.4 MG SL tablet PLACE 1 TABLET UNDER THE TONGUE EVERY 5 MINUTES FOR 3 DOSES AS NEEDED FOR CHEST PAIN Patient taking differently: Place 0.4 mg under the tongue every 5 (five) minutes as needed for chest pain.  03/10/20  Yes Leonie Man, MD  ticagrelor (BRILINTA) 60 MG TABS tablet Take 1 tablet (60 mg total) by  mouth 2 (two) times daily. 08/24/19  Yes Leonie Man, MD  vitamin B-12 (CYANOCOBALAMIN) 500 MCG tablet Take 500 mcg by mouth daily.   Yes [provider]  vitamin C (ASCORBIC ACID) 500 MG tablet Take 500 mg by mouth daily.   Yes [provider]  ciprofloxacin-dexamethasone (CIPRODEX) OTIC suspension INSTILL 4 DROPS TWICE DAILY INTO AFFECTED EAR FOR EXTERNAL OTITIS FOR 7 DAYS 04/15/20   [provider]    Allergies    Contrast media [iodinated diagnostic agents]  Review of Systems   Review of Systems  Cardiovascular: Positive for chest pain.  All other systems reviewed and are negative.   Physical Exam Updated Vital Signs BP (!) 148/103   Pulse 90   Temp 98.2 F (36.8 C) (Oral)   Resp 18   Ht '5\' 5"'  (1.651 m)   Wt 92.5 kg   SpO2 100%   BMI 33.95 kg/m  Physical Exam Vitals and nursing note reviewed.  Constitutional:      Appearance: She is well-developed.  HENT:     Head: Normocephalic and atraumatic.  Cardiovascular:     Rate and Rhythm: Normal rate and regular rhythm.     Heart sounds: No murmur heard.   Pulmonary:     Effort: Pulmonary effort is normal. No respiratory distress.     Breath sounds: Normal breath sounds.  Abdominal:     Palpations: Abdomen is soft.     Tenderness: There is no abdominal tenderness. There is no guarding or rebound.  Musculoskeletal:        General: No swelling or tenderness.  Skin:    General: Skin is warm and dry.  Neurological:     Mental Status: She is alert and oriented to person, place, and time.  Psychiatric:        Behavior: Behavior normal.     ED Results / Procedures / Treatments   Labs (all labs ordered are listed, but only abnormal results are displayed) Labs Reviewed  TROPONIN I (HIGH SENSITIVITY)    EKG None  Radiology DG Chest 2 View  Result Date: 06/12/2020 CLINICAL DATA:  Chest pain EXAM: CHEST - 2 VIEW COMPARISON:  08/08/2018 FINDINGS: The heart size and mediastinal contours  are within normal limits. Both lungs are clear. The visualized skeletal structures are unremarkable. IMPRESSION: No active cardiopulmonary disease. Electronically Signed   By: Rolm Baptise M.D.   On: 06/12/2020 18:10    Procedures Procedures (including critical care time)  Medications Ordered in ED Medications  sodium chloride flush (NS) 0.9 % injection 3 mL (has no administration in time range)  carvedilol (COREG) tablet 6.25 mg (6.25 mg Oral Given 06/12/20 2126)    ED Course  I have reviewed the triage vital signs and the nursing notes.  Pertinent labs & imaging results that were available during my care of the patient were reviewed by me and considered in my medical decision making (see chart for details).    MDM Rules/Calculators/A&P                         Patient with history of coronary artery disease here for evaluation of chest pain since yesterday. EKG without acute ischemic changes in troponin is negative times two. Presentation is not consistent with ACS, PE, dissection. Discussed with patient home care for chest pain, unclear source. Recommend close outpatient cardiology follow-up as well as return precautions.  Final Clinical Impression(s) / ED Diagnoses Final diagnoses:  Nonspecific chest pain    Rx / DC Orders ED Discharge Orders    None       Quintella Reichert, MD 06/12/20 2251

## 2020-06-12 NOTE — ED Notes (Signed)
EKG printed and given to Rebecca Cuevas

## 2020-06-12 NOTE — ED Notes (Signed)
Pt family member upset and yelling in lobby because pt is waiting for a room. Explained pt is waiting for a room.

## 2020-06-12 NOTE — ED Triage Notes (Signed)
Pt reports CP starting yesterday. Saw her PCP today and had an EKG today at 2. No pain at this time, received 1 nitro at PCP. Sharp central pain that went through to her back. Hx of stent 2 years ago.

## 2020-06-12 NOTE — ED Triage Notes (Addendum)
Patient c/o chest pain yesterday. Patient saw her PCP today and had an EKG done. Patient then went to [facility] ED and had blood work and an EKG completed. Patient left [place] prior to seeing a provider due to wait times.  Patient states she had NTG with relief when she saw her PCP today.

## 2020-06-12 NOTE — ED Notes (Signed)
Pt yelling in the lobby that staff isnt doing anything to help her. Pt sts " I am calling state bc there is no sense in people waiting in the lobby who are hurting". Pt is able to talk in clear sentences. This tech revitalized pt and also captured a second EKG to help assure the ppt that she was stable to wait in the lobby. Pt is still very upset. RN Scientist, research (physical sciences) notified

## 2020-06-17 ENCOUNTER — Telehealth: Payer: Self-pay | Admitting: Cardiology

## 2020-06-17 NOTE — Telephone Encounter (Signed)
Pt c/o of Chest Pain: STAT if CP now or developed within 24 hours  1. Are you having CP right now? no  2. Are you experiencing any other symptoms (ex. SOB, nausea, vomiting, sweating)? BP was elevated, does not remember the readings  3. How long have you been experiencing CP? A day and a half  4. Is your CP continuous or coming and going? continuous  5. Have you taken Nitroglycerin? Yes, eased pain    Patient states she saw her PCP for the chest pain and was told to go to the hospital. She states she is no longer having the chest pain. She states they sent her EKG to the office. She has an appointment 07/21/2020.  ?

## 2020-06-17 NOTE — Telephone Encounter (Signed)
Called  Spoke to  Female person- states patient is at work.   Patient will not be home until 3 pm .   Asked if patient could call  Office back - can offer  virtual appointment with Dr Ellyn Hack and keep appointment for 07/21/20 in office

## 2020-06-17 NOTE — Telephone Encounter (Signed)
Late entry .  patient called after 3 pm   offered patient an virtual appointment with Dr Ellyn Hack. Informed patent had  Reviewed  Past Ekg from  Her last office visit with  [place]. Not  Much change per Dr harding from past EKG.  Patient states she would rather see Dr Ellyn Hack in Aug 2021.  patient aware if sx return  Go to ER and/ or contact office.

## 2020-07-13 HISTORY — PX: NM MYOVIEW LTD: HXRAD82

## 2020-07-21 ENCOUNTER — Ambulatory Visit (INDEPENDENT_AMBULATORY_CARE_PROVIDER_SITE_OTHER): Payer: PRIVATE HEALTH INSURANCE | Admitting: Cardiology

## 2020-07-21 ENCOUNTER — Other Ambulatory Visit: Payer: Self-pay

## 2020-07-21 ENCOUNTER — Encounter: Payer: Self-pay | Admitting: Cardiology

## 2020-07-21 VITALS — BP 134/80 | HR 72 | Ht 65.0 in | Wt 207.0 lb

## 2020-07-21 DIAGNOSIS — E1169 Type 2 diabetes mellitus with other specified complication: Secondary | ICD-10-CM | POA: Diagnosis not present

## 2020-07-21 DIAGNOSIS — F17200 Nicotine dependence, unspecified, uncomplicated: Secondary | ICD-10-CM

## 2020-07-21 DIAGNOSIS — I25119 Atherosclerotic heart disease of native coronary artery with unspecified angina pectoris: Secondary | ICD-10-CM

## 2020-07-21 DIAGNOSIS — E119 Type 2 diabetes mellitus without complications: Secondary | ICD-10-CM

## 2020-07-21 DIAGNOSIS — R072 Precordial pain: Secondary | ICD-10-CM | POA: Diagnosis not present

## 2020-07-21 DIAGNOSIS — I10 Essential (primary) hypertension: Secondary | ICD-10-CM

## 2020-07-21 DIAGNOSIS — E785 Hyperlipidemia, unspecified: Secondary | ICD-10-CM

## 2020-07-21 DIAGNOSIS — Z955 Presence of coronary angioplasty implant and graft: Secondary | ICD-10-CM

## 2020-07-21 MED ORDER — CARVEDILOL 6.25 MG PO TABS: ORAL_TABLET | ORAL | 3 refills | Status: DC

## 2020-07-21 MED ORDER — ATORVASTATIN CALCIUM 40 MG PO TABS
40.0000 mg | ORAL_TABLET | Freq: Every day | ORAL | 3 refills | Status: DC
Start: 2020-07-21 — End: 2021-07-22

## 2020-07-21 NOTE — Progress Notes (Signed)
Primary Care Provider: Leonard Downing, MD Cardiologist: Glenetta Hew, MD Electrophysiologist: None  Clinic Note: Chief Complaint  Patient presents with  . Hospitalization Follow-up    ER follow-up  . Chest Pain  . Coronary Artery Disease    Chest pain, but not sure if her symptoms are true angina   HPI:    Basya Casavant is a 59 y.o. female with a  PMH of Inferior NSTEMI in Aug 2019 - PCI RCA who presents today for annual f/u.   September 2020  doing okay overall from a cardiac standpoint.  She still smokes about 10 cigarettes a day, but this is much less than she used to smoke.  Noted that she walks a lot during work.  Intermittent chest discomfort off and on, but not like her angina.  More chest wall related.  Jailene Cupit was last seen on February 21, 2020 --> doing well from a cardiac standpoint.  Noted heart rate going fast when she is anxious but otherwise no tachycardia.  No real chest pain or pressure with rest or exertion occasional chest discomfort here and there but not exertional.  Nothing consistent with prior angina.  Recent Hospitalizations:   [facility], ER 06/12/2020: Chest pain evaluation -> r/o MI.  Describe sharp central chest pains waxing and waning for over a day.  No clear alleviating factors or exacerbating factors.  Saw PCP who performed an EKG and sent her to the ER.  Felt to be safe for discharge  Reviewed  CV studies:    The following studies were reviewed today: (if available, images/films reviewed: From Epic Chart or Care Everywhere) . None:  Interval History:   Kinzey Sheriff presents here today stating that she still has these intermittent episodes of chest discomfort that can happen with or without activity.  It is worse with deep inspiration.  She describes it as a sharp discomfort that turns to a tightness.  She says that some of the symptom is very similar to when she had her MI as far as the location and intensity, but it is not  necessarily immediately worse with exertion than at rest. He also continues to have intermittent palpitations associate with anxiety or stress.  CV Review of Symptoms (Summary): no chest pain or dyspnea on exertion positive for - rapid heart rate and Short-lived spells with anxiety or emotional upset negative for - edema, irregular heartbeat, orthopnea, palpitations, paroxysmal nocturnal dyspnea, shortness of breath or Syncope/near syncope, TIA/amaurosis fugax; claudication   Has yet to fully quit smoking.  Has thought about different options for quitting but has not gotten below 8-10 cigarettes a day.  She says that she uses cigarettes as a "crutch "  The patient DOES NOT have symptoms concerning for COVID-19 infection (fever, chills, cough, or new shortness of breath).  The patient is practicing social distancing & Masking.   COVID-19 vaccine -> completed both shots.   REVIEWED OF SYSTEMS   Review of Systems  Constitutional: Negative for malaise/fatigue (Does not have a lot of energy but not really fatigued) and weight loss.  HENT: Negative for congestion and nosebleeds.   Respiratory: Negative for shortness of breath.   Cardiovascular: Positive for chest pain (Bilateral parasternal pain radiating down along the right rib cage more than left.).  Gastrointestinal: Negative for abdominal pain, blood in stool and melena.  Genitourinary: Negative for flank pain and hematuria.  Musculoskeletal: Negative for joint pain.  Neurological: Positive for tingling (Sometimes in her fingers) and headaches (  She notes this if her heart rate or blood pressure goes up.). Negative for dizziness, focal weakness and weakness.  Psychiatric/Behavioral: Negative for memory loss. The patient is not nervous/anxious and does not have insomnia.      I have reviewed and (if needed) personally updated the patient's problem list, medications, allergies, past medical and surgical history, social and family history.     PAST MEDICAL HISTORY   Past Medical History:  Diagnosis Date  . CAD S/P DES PCI 08/09/2018   Thrombectomy & DES PCI of pRCA 99% - Synergy DES 3.5 x 20.  . Diabetes mellitus without complication ([place])   . H/O non-ST elevation myocardial infarction (NSTEMI) 08/09/2018   99% thrombotic pRCA --> thrombectomy & DES PCI (overnight aggrastat for distal embolization to RPAV) -  Lv Gram EF 50-55% with Inferior HK -> f/u Echo with Hyperdynamic LV, EF 65-70% & no RWMA.  . Hypertension     PAST SURGICAL HISTORY   Past Surgical History:  Procedure Laterality Date  . ABDOMINAL HYSTERECTOMY    . CESAREAN SECTION    . CORONARY STENT INTERVENTION N/A 08/09/2018   Procedure: CORONARY STENT INTERVENTION;  Surgeon: Wellington Hampshire, MD;  Location: [facility];  Service: Cardiovascular;  prox RCA 99% (thrombotic) stenosis (DES PCI with distal emobolization to rPAV; Synergy DES 3.5 x 20), -> rPAV 80% (distal embolization)  . HERNIA REPAIR    . LEFT HEART CATH AND CORONARY ANGIOGRAPHY N/A 08/09/2018   Procedure: LEFT HEART CATH AND CORONARY ANGIOGRAPHY;  Surgeon: Wellington Hampshire, MD;  Location: [facility];  prox RCA 99% (thrombotic) stenosis (DES PCI with distal emobolization to rPAV; Synergy DES), -> rPAV 80% (distal embolization). p-mCx 30%. p-m LAD 40%. EF 50-55% w/ Inferior HK.  Marland Kitchen TRANSTHORACIC ECHOCARDIOGRAM  08/10/2018   post Inf NSTEMI --> Mild concentric LVH with vigorous function.  EF 65-70%.  No RWMA.  GR 1 DD.   Cath PCI 08/09/2018: (thrombectomy & DES PCI pRCA) Synergy DES 3.5 x 20 (distal embolization to RPAV, overnight Aggrastat). EF 50-55% - inferior HK.    MEDICATIONS/ALLERGIES   Current Meds  Medication Sig  . betamethasone dipropionate 0.05 % cream Apply topically 2 (two) times daily.  . Blood Glucose Monitoring Suppl (BAYER CONTOUR NEXT MONITOR) w/Device KIT 1 Device by Does not apply route 2 (two) times daily.  . carvedilol (COREG) 6.25 MG tablet Take  6.25 mg  (  1 tablet ) in the morning , anf take 12.5 mg ( 2 tablet ) in the evening  . ciprofloxacin-dexamethasone (CIPRODEX) OTIC suspension INSTILL 4 DROPS TWICE DAILY INTO AFFECTED EAR FOR EXTERNAL OTITIS FOR 7 DAYS  . clotrimazole-betamethasone (LOTRISONE) cream Apply 1 application topically 2 (two) times daily.  Marland Kitchen gentamicin cream (GARAMYCIN) 0.1 % Apply 1 application topically 2 (two) times daily.  Marland Kitchen glucose blood (BAYER CONTOUR NEXT TEST) test strip Use as instructed  . hydrocortisone cream 1 % APPLY ONE APPLICATION TOPICALLY ONCE DAILY AS NEEDED FOR ITCHING (FOR ECZEMA).  Elmore Guise Devices (MICROLET NEXT LANCING DEVICE) MISC 1 Device by Does not apply route 2 (two) times daily.  . metFORMIN (GLUCOPHAGE) 500 MG tablet TAKE 1 TABLET BY MOUTH TWICE DAILY WITH MEALS  . Multiple Vitamins-Minerals (COMPLETE MULTIVITAMIN/MINERAL PO) Take by mouth.  . nitroGLYCERIN (NITROSTAT) 0.4 MG SL tablet PLACE 1 TABLET UNDER THE TONGUE EVERY 5 MINUTES FOR 3 DOSES AS NEEDED FOR CHEST PAIN (Patient taking differently: Place 0.4 mg under the tongue every 5 (five) minutes as  needed for chest pain. )  . ticagrelor (BRILINTA) 60 MG TABS tablet Take 1 tablet (60 mg total) by mouth 2 (two) times daily.  . vitamin B-12 (CYANOCOBALAMIN) 500 MCG tablet Take 500 mcg by mouth daily.  . vitamin C (ASCORBIC ACID) 500 MG tablet Take 500 mg by mouth daily.  . [DISCONTINUED] atorvastatin (LIPITOR) 80 MG tablet Take 1/2 (one-half) tablet by mouth once daily (Patient taking differently: Take 40 mg by mouth daily. )  . [DISCONTINUED] carvedilol (COREG) 6.25 MG tablet TAKE 1 TABLET BY MOUTH TWICE DAILY WITH A MEAL (Patient taking differently: Take 6.25 mg by mouth 2 (two) times daily with a meal. )    Allergies  Allergen Reactions  . Contrast Media [Iodinated Diagnostic Agents] Hives, Shortness Of Breath and Itching    Pt broke out in large hives on her face after CT scan with contrast injection. Pt became slightly more short of breath than  she was already after scan as well. Pt needs pre-meds prior to future studies.     SOCIAL HISTORY/FAMILY HISTORY   Reviewed in Epic:  Pertinent findings: Still no change in smoking -- thinking of patch, but has not committed to it.  Yet she still smokes about 10 cigarettes a day.  Wants to quit, but has a hard time.  OBJCTIVE -PE, EKG, labs   Wt Readings from Last 3 Encounters:  07/21/20 207 lb (93.9 kg)  06/12/20 204 lb (92.5 kg)  06/12/20 204 lb (92.5 kg)    Physical Exam: BP 134/80   Pulse 72   Ht '5\' 5"'  (1.651 m)   Wt 207 lb (93.9 kg)   BMI 34.45 kg/m  Physical Exam Vitals reviewed.  Constitutional:      General: She is not in acute distress.    Appearance: Normal appearance. She is well-developed. She is obese. She is not ill-appearing.     Comments: Well-groomed.  HENT:     Head: Normocephalic and atraumatic.  Neck:     Vascular: No JVD.  Cardiovascular:     Rate and Rhythm: Normal rate and regular rhythm.     Pulses: Intact distal pulses.     Heart sounds: Normal heart sounds. No murmur heard.  No friction rub. No gallop.   Pulmonary:     Effort: Pulmonary effort is normal. No respiratory distress.     Breath sounds: Normal breath sounds. No wheezing or rales.  Chest:     Chest wall: Tenderness (She does have point tenderness along bilateral sternal borders in the second third or fourth rib space as well as in the right subxiphoid area along the lower ribs.) present.  Abdominal:     General: Abdomen is flat. Bowel sounds are normal. There is no distension.     Palpations: Abdomen is soft. There is no mass (No HSM).     Tenderness: There is no abdominal tenderness. There is no left CVA tenderness or rebound.  Musculoskeletal:        General: No swelling. Normal range of motion.     Cervical back: Normal range of motion and neck supple.     Comments: Mild right calf tenderness  Neurological:     General: No focal deficit present.     Mental Status: She is  alert and oriented to person, place, and time.  Psychiatric:        Mood and Affect: Mood normal.        Behavior: Behavior normal.        Thought  Content: Thought content normal.        Judgment: Judgment normal.     Adult ECG Report  Rate: 72;  Rhythm: normal sinus rhythm and Normal axis, intervals and durations;   Narrative Interpretation: Stable/normal  Recent Labs:    Lab Results  Component Value Date   CHOL 138 02/15/2020   HDL 52 02/15/2020   LDLCALC 69 02/15/2020   TRIG 93 02/15/2020   CHOLHDL 2.7 02/15/2020   Lab Results  Component Value Date   CREATININE 0.89 06/12/2020   BUN 10 06/12/2020   NA 141 06/12/2020   K 3.7 06/12/2020   CL 110 06/12/2020   CO2 23 06/12/2020   Lab Results  Component Value Date   TSH 1.36 01/27/2017   Lab Results  Component Value Date   HGBA1C 6.6 (H) 08/09/2018    ASSESSMENT/PLAN    Problem List Items Addressed This Visit    Diabetes mellitus type II, non insulin dependent (HCC) (Chronic)    She is on Metformin with an A1c of 6.6.   -> If not adequately controlled, would consider SGLT2 inhibitor or GLP-1 agonist.      Relevant Medications   atorvastatin (LIPITOR) 40 MG tablet   Other Relevant Orders   EKG 12-Lead (Completed)   MYOCARDIAL PERFUSION IMAGING   Hyperlipidemia associated with type 2 DM - Goal LDL < 70. (Chronic)    Lipids from March look pretty well controlled.  LDL is just below the threshold of 70.  She is on high-dose high intensity statin, will continue for now.  Monitor triglycerides in the setting of diabetes.      Relevant Medications   atorvastatin (LIPITOR) 40 MG tablet   Other Relevant Orders   EKG 12-Lead (Completed)   MYOCARDIAL PERFUSION IMAGING   TOBACCO DEPENDENCE (Chronic)    I talked about trying to gradually cut back on the cigarettes he smokes down.  She says it is hard to cut back any more than she is doing because it is a crutch. She is now thinking that she may actually try to use the  patch.  3-5 minutes spent talking about smoking cessation.      Essential hypertension (Chronic)    Borderline elevated blood pressure today.  Her chest discomfort symptoms seem to happen more in the morning with waking up.  Plan: Increase p.m. dose of carvedilol 12.5 mg and continue stable dose in the morning.  We may potentially we will titrate up to 12.5 mg twice daily.      Relevant Medications   carvedilol (COREG) 6.25 MG tablet   atorvastatin (LIPITOR) 40 MG tablet   Coronary artery disease involving native coronary artery of native heart with angina pectoris (HCC) (Chronic)    She has now had continued intermittent chest pain that has some features similar to her prior anginal symptoms.  There is distal embolization of the time of her MI with partial occlusion of the posterior AV groove branch. She is having intermittent chest discomfort that is potential exacerbated with exertion.  Symptoms do not sound classic for angina however are somewhat similar to her previous anginal equivalent..  Plan: Concern for atypical angina--> will evaluate with Lexiscan Myoview to ensure no progression of disease.  Increase p.m. dose of carvedilol to 12.5 mg and continue to 620 5 in the morning.  For now continue maintenance dose Brilinta along with high-dose statin.  As needed NTG      Relevant Medications   carvedilol (COREG) 6.25 MG tablet  atorvastatin (LIPITOR) 40 MG tablet   Other Relevant Orders   EKG 12-Lead (Completed)   MYOCARDIAL PERFUSION IMAGING   Presence of drug coated stent in right coronary artery (Chronic)    Status post DES in RCA 2 years ago.  With ongoing chest pain issues, I would prefer to keep her on maintenance dose of Brilinta 60 mg twice daily.  No longer on aspirin.   Okay to hold Brilinta 5 to 7 days preop for surgeries or procedures.       Precordial pain - Primary    Some of her discomfort is reproducible on exam, but some is worse with exertion.  I have  for her to get back into physical routine, we did confirm that she is not truly having anginal symptoms.  I am concerned that this will cause her to become more sedentary.  There are clearly some atypical as well as typical features of her chest discomfort.  Plan: Check Lexiscan Myoview and to confirm no progression of disease.      Relevant Orders   EKG 12-Lead (Completed)   MYOCARDIAL PERFUSION IMAGING       COVID-19 Education: The signs and symptoms of COVID-19 were discussed with the patient and how to seek care for testing (follow up with PCP or arrange E-visit).   The importance of social distancing was discussed today.  I spent a total of 19 minutes with the patient. >  50% of the time was spent in direct patient consultation.  Additional time spent with chart review  / charting (studies, outside notes, etc): 7 Total Time: 26 min   Current medicines are reviewed at length with the patient today.  (+/- concerns) none   Patient Instructions / Medication Changes & Studies & Tests Ordered   Patient Instructions  Medication Instructions:  Continue with 6.25 mg in the morning  increas evening dose carvedilol to 12.5 mg ( 2 tablets of 6.25 mg tablets)     *If you need a refill on your cardiac medications before your next appointment, please call your pharmacy*   Lab Work: Not needed If you have labs (blood work) drawn today and your tests are completely normal, you will receive your results only by: Marland Kitchen MyChart Message (if you have MyChart) OR . A paper copy in the mail If you have any lab test that is abnormal or we need to change your treatment, we will call you to review the results.   Testing/Procedures:  will be schedule at  [facility] has requested that you have a lexiscan myoview. For further information please visit [email or web address]. Please follow instruction sheet, as given.    Follow-Up: At [facility], you and  your health needs are our priority.  As part of our continuing mission to provide you with exceptional heart care, we have created designated Provider Care Teams.  These Care Teams include your primary Cardiologist (physician) and Advanced Practice Providers (APPs -  Physician Assistants and Nurse Practitioners) who all work together to provide you with the care you need, when you need it.  We recommend signing up for the patient portal called "MyChart".  Sign up information is provided on this After Visit Summary.  MyChart is used to connect with patients for Virtual Visits (Telemedicine).  Patients are able to view lab/test results, encounter notes, upcoming appointments, etc.  Non-urgent messages can be sent to your provider as well.   To learn more about what you can do  with MyChart, go to [email or web address].    Your next appointment:   1 month(s) after lexiscan is completed  The format for your next appointment:   Virtual Visit   Provider:   Glenetta Hew, MD   Other Instructions     Studies Ordered:   Orders Placed This Encounter  Procedures  . MYOCARDIAL PERFUSION IMAGING  . EKG 12-Lead     Glenetta Hew, M.D., M.S. Interventional Cardiologist   Pager # [phone] Phone # [phone] [address] [place], [place] [address]   Thank you for choosing Heartcare at [facility]!!

## 2020-07-21 NOTE — Patient Instructions (Addendum)
Medication Instructions:  Continue with 6.25 mg in the morning  increas evening dose carvedilol to 12.5 mg ( 2 tablets of 6.25 mg tablets)     *If you need a refill on your cardiac medications before your next appointment, please call your pharmacy*   Lab Work: Not needed If you have labs (blood work) drawn today and your tests are completely normal, you will receive your results only by: Marland Kitchen MyChart Message (if you have MyChart) OR . A paper copy in the mail If you have any lab test that is abnormal or we need to change your treatment, we will call you to review the results.   Testing/Procedures:  will be schedule at  [place] has requested that you have a lexiscan myoview. For further information please visit [email or web address]. Please follow instruction sheet, as given.    Follow-Up: At [facility], you and your health needs are our priority.  As part of our continuing mission to provide you with exceptional heart care, we have created designated Provider Care Teams.  These Care Teams include your primary Cardiologist (physician) and Advanced Practice Providers (APPs -  Physician Assistants and Nurse Practitioners) who all work together to provide you with the care you need, when you need it.  We recommend signing up for the patient portal called "MyChart".  Sign up information is provided on this After Visit Summary.  MyChart is used to connect with patients for Virtual Visits (Telemedicine).  Patients are able to view lab/test results, encounter notes, upcoming appointments, etc.  Non-urgent messages can be sent to your provider as well.   To learn more about what you can do with MyChart, go to [email or web address].    Your next appointment:   1 month(s) after lexiscan is completed  The format for your next appointment:   Virtual Visit   Provider:   Glenetta Hew, MD   Other Instructions

## 2020-07-23 ENCOUNTER — Encounter: Payer: Self-pay | Admitting: Cardiology

## 2020-07-23 ENCOUNTER — Telehealth: Payer: Self-pay | Admitting: Cardiology

## 2020-07-23 NOTE — Assessment & Plan Note (Signed)
Lipids from March look pretty well controlled.  LDL is just below the threshold of 70.  She is on high-dose high intensity statin, will continue for now.  Monitor triglycerides in the setting of diabetes.

## 2020-07-23 NOTE — Telephone Encounter (Signed)
Left message for patient to return call to get 3-6 mos  follow up scheduled with Ellyn Hack

## 2020-07-23 NOTE — Assessment & Plan Note (Signed)
Status post DES in RCA 2 years ago.  With ongoing chest pain issues, I would prefer to keep her on maintenance dose of Brilinta 60 mg twice daily.  No longer on aspirin.   Okay to hold Brilinta 5 to 7 days preop for surgeries or procedures.

## 2020-07-23 NOTE — Assessment & Plan Note (Addendum)
I talked about trying to gradually cut back on the cigarettes he smokes down.  She says it is hard to cut back any more than she is doing because it is a crutch. She is now thinking that she may actually try to use the patch.  3-5 minutes spent talking about smoking cessation.

## 2020-07-23 NOTE — Assessment & Plan Note (Signed)
Borderline elevated blood pressure today.  Her chest discomfort symptoms seem to happen more in the morning with waking up.  Plan: Increase p.m. dose of carvedilol 12.5 mg and continue stable dose in the morning.  We may potentially we will titrate up to 12.5 mg twice daily.

## 2020-07-23 NOTE — Assessment & Plan Note (Signed)
She is on Metformin with an A1c of 6.6.   -> If not adequately controlled, would consider SGLT2 inhibitor or GLP-1 agonist.

## 2020-07-23 NOTE — Assessment & Plan Note (Signed)
Some of her discomfort is reproducible on exam, but some is worse with exertion.  I have for her to get back into physical routine, we did confirm that she is not truly having anginal symptoms.  I am concerned that this will cause her to become more sedentary.  There are clearly some atypical as well as typical features of her chest discomfort.  Plan: Check Lexiscan Myoview and to confirm no progression of disease.

## 2020-07-23 NOTE — Assessment & Plan Note (Signed)
She has now had continued intermittent chest pain that has some features similar to her prior anginal symptoms.  There is distal embolization of the time of her MI with partial occlusion of the posterior AV groove branch. She is having intermittent chest discomfort that is potential exacerbated with exertion.  Symptoms do not sound classic for angina however are somewhat similar to her previous anginal equivalent..  Plan: Concern for atypical angina--> will evaluate with Lexiscan Myoview to ensure no progression of disease.  Increase p.m. dose of carvedilol to 12.5 mg and continue to 620 5 in the morning.  For now continue maintenance dose Brilinta along with high-dose statin.  As needed NTG

## 2020-07-30 ENCOUNTER — Telehealth (HOSPITAL_COMMUNITY): Payer: Self-pay | Admitting: *Deleted

## 2020-07-30 NOTE — Telephone Encounter (Signed)
Attempted calling patient to go over instructions for stress test on 08/01/20.  Could not leave message on voice mail.  Needed a code to leave message and did not have one.  Patient does not have My Chart to send letter.

## 2020-08-01 ENCOUNTER — Other Ambulatory Visit: Payer: Self-pay

## 2020-08-01 ENCOUNTER — Ambulatory Visit (HOSPITAL_COMMUNITY): Payer: PRIVATE HEALTH INSURANCE | Attending: Internal Medicine

## 2020-08-01 DIAGNOSIS — E119 Type 2 diabetes mellitus without complications: Secondary | ICD-10-CM

## 2020-08-01 DIAGNOSIS — E785 Hyperlipidemia, unspecified: Secondary | ICD-10-CM

## 2020-08-01 DIAGNOSIS — E1169 Type 2 diabetes mellitus with other specified complication: Secondary | ICD-10-CM | POA: Diagnosis not present

## 2020-08-01 DIAGNOSIS — I25119 Atherosclerotic heart disease of native coronary artery with unspecified angina pectoris: Secondary | ICD-10-CM

## 2020-08-01 DIAGNOSIS — R072 Precordial pain: Secondary | ICD-10-CM | POA: Diagnosis not present

## 2020-08-01 LAB — MYOCARDIAL PERFUSION IMAGING
LV dias vol: 93 mL (ref 46–106)
LV sys vol: 40 mL
Peak HR: 115 {beats}/min
Rest HR: 62 {beats}/min
SDS: 0
SRS: 2
SSS: 2
TID: 1.1

## 2020-08-01 MED ORDER — TECHNETIUM TC 99M TETROFOSMIN IV KIT
31.4000 | PACK | Freq: Once | INTRAVENOUS | Status: AC | PRN
  Administered 2020-08-01: 31.4 via INTRAVENOUS
  Filled 2020-08-01: qty 32

## 2020-08-01 MED ORDER — REGADENOSON 0.4 MG/5ML IV SOLN
0.4000 mg | Freq: Once | INTRAVENOUS | Status: AC
  Administered 2020-08-01: 0.4 mg via INTRAVENOUS

## 2020-08-01 MED ORDER — TECHNETIUM TC 99M TETROFOSMIN IV KIT
11.0000 | PACK | Freq: Once | INTRAVENOUS | Status: AC | PRN
  Administered 2020-08-01: 11 via INTRAVENOUS
  Filled 2020-08-01: qty 11

## 2020-08-13 ENCOUNTER — Other Ambulatory Visit: Payer: Self-pay

## 2020-08-13 ENCOUNTER — Ambulatory Visit (INDEPENDENT_AMBULATORY_CARE_PROVIDER_SITE_OTHER): Payer: PRIVATE HEALTH INSURANCE | Admitting: Cardiology

## 2020-08-13 ENCOUNTER — Encounter: Payer: Self-pay | Admitting: Cardiology

## 2020-08-13 VITALS — BP 123/81 | HR 85 | Ht 65.0 in | Wt 208.0 lb

## 2020-08-13 DIAGNOSIS — R000 Tachycardia, unspecified: Secondary | ICD-10-CM

## 2020-08-13 DIAGNOSIS — I25119 Atherosclerotic heart disease of native coronary artery with unspecified angina pectoris: Secondary | ICD-10-CM

## 2020-08-13 DIAGNOSIS — E119 Type 2 diabetes mellitus without complications: Secondary | ICD-10-CM | POA: Diagnosis not present

## 2020-08-13 DIAGNOSIS — E1169 Type 2 diabetes mellitus with other specified complication: Secondary | ICD-10-CM | POA: Diagnosis not present

## 2020-08-13 DIAGNOSIS — I10 Essential (primary) hypertension: Secondary | ICD-10-CM | POA: Diagnosis not present

## 2020-08-13 DIAGNOSIS — E785 Hyperlipidemia, unspecified: Secondary | ICD-10-CM

## 2020-08-13 DIAGNOSIS — F17200 Nicotine dependence, unspecified, uncomplicated: Secondary | ICD-10-CM

## 2020-08-13 MED ORDER — CARVEDILOL 12.5 MG PO TABS: 12.5000 mg | ORAL_TABLET | Freq: Two times a day (BID) | ORAL | 3 refills | Status: DC

## 2020-08-13 NOTE — Patient Instructions (Addendum)
Medication Instructions:  INCREASE- Carvedilol 12.5 mg by mouth twice a day  *If you need a refill on your cardiac medications before your next appointment, please call your pharmacy*   Lab Work: None Ordered   Testing/Procedures: None ordered   Follow-Up: At [facility], you and your health needs are our priority.  As part of our continuing mission to provide you with exceptional heart care, we have created designated Provider Care Teams.  These Care Teams include your primary Cardiologist (physician) and Advanced Practice Providers (APPs -  Physician Assistants and Nurse Practitioners) who all work together to provide you with the care you need, when you need it.  We recommend signing up for the patient portal called "MyChart".  Sign up information is provided on this After Visit Summary.  MyChart is used to connect with patients for Virtual Visits (Telemedicine).  Patients are able to view lab/test results, encounter notes, upcoming appointments, etc.  Non-urgent messages can be sent to your provider as well.   To learn more about what you can do with MyChart, go to [email or web address].    Your next appointment:   1 year(s)  The format for your next appointment:   In Person  Provider:   You may see Glenetta Hew, MD or one of the following Advanced Practice Providers on your designated Care Team:    Rosaria Ferries, PA-C  Jory Sims, DNP, ANP  Cadence Kathlen Mody, PA-C

## 2020-08-13 NOTE — Progress Notes (Signed)
Primary Care Provider: Leonard Downing, MD Cardiologist: Glenetta Hew, MD Electrophysiologist: None  Clinic Note: Chief Complaint  Patient presents with   Follow-up    Myoview result.   Chest Pain    No further episodes.   Coronary Artery Disease    Doing well   Nicotine Dependence    Not yet ready to quit.    HPI:    Rebecca Cuevas is a 59 y.o. with a PMH of Inferior NSTEMI in Aug 2019 - PCI RCA who presents today for 1 month follow-up after chest pain evaluation with Myoview.Rebecca Cuevas was last seen on July 21, 2020 for ER follow-up with complaints of sharp central chest pain-of intermittent episodes, worse with deep inspiration.  The sharp discomfort turns in tightness (according to her similar to prior MI as far as location and intensity).  Not exacerbated by exertion.  Also intermittent palpitations..  Myoview ordered  Recent Hospitalizations:   No new visits  Reviewed  CV studies:    The following studies were reviewed today: (if available, images/films reviewed: From Epic Chart or Care Everywhere)  Myoview (08/01/2020): EF 55 to 60%.  No EKG changes.  No ischemia or infarction.  LOW RISK.(There is suggestion of breast attenuation)  Interval History:   Rebecca Cuevas presents here today indicating that she is not having any further chest pain or palpitations.  The symptoms she had before was reproducible on exam, no longer present.  Still working on cutting down many cigarettes she smokes-but still at roughly 8 cigarettes a day..  Tolerating the increase of p.m. dose carvedilol to 12.5 mg, but blood pressure still remains somewhat elevated in the mornings.  But the one time she notices rapid heart rate spells when she is upset.  Otherwise no palpitations.  CV Review of Symptoms (Summary): no chest pain or dyspnea on exertion positive for - rapid heart rate and Short-lived spells with anxiety or emotional upset negative for - edema,  irregular heartbeat, orthopnea, palpitations, paroxysmal nocturnal dyspnea, shortness of breath or Syncope/near syncope, TIA/amaurosis fugax; claudication   The patient DOES NOT have symptoms concerning for COVID-19 infection (fever, chills, cough, or new shortness of breath).  The patient is practicing social distancing & Masking.   COVID-19 vaccine -> completed both shots.   REVIEWED OF SYSTEMS   Review of Systems  Constitutional: Negative for malaise/fatigue (Does not have a lot of energy but not really fatigued) and weight loss.  HENT: Negative for congestion and nosebleeds.   Respiratory: Negative for shortness of breath.   Cardiovascular: Positive for palpitations (Heart rate goes fast when she is upset). Negative for chest pain (intermittent rib cage pain).  Gastrointestinal: Negative for abdominal pain, blood in stool and melena.  Genitourinary: Negative for flank pain and hematuria.  Musculoskeletal: Negative for joint pain.  Neurological: Positive for tingling (Sometimes in her fingers) and headaches. Negative for dizziness, focal weakness and weakness.  Psychiatric/Behavioral: Negative for memory loss. The patient is not nervous/anxious and does not have insomnia.    I have reviewed and (if needed) personally updated the patient's problem list, medications, allergies, past medical and surgical history, social and family history.   PAST MEDICAL HISTORY   Past Medical History:  Diagnosis Date   CAD S/P DES PCI 08/09/2018   Thrombectomy & DES PCI of pRCA 99% - Synergy DES 3.5 x 20.   Diabetes mellitus without complication ([place])    H/O non-ST elevation myocardial infarction (NSTEMI) 08/09/2018   99%  thrombotic pRCA --> thrombectomy & DES PCI (overnight aggrastat for distal embolization to RPAV) -  Lv Gram EF 50-55% with Inferior HK -> f/u Echo with Hyperdynamic LV, EF 65-70% & no RWMA.   Hypertension     PAST SURGICAL HISTORY   Past Surgical History:  Procedure  Laterality Date   ABDOMINAL HYSTERECTOMY     CESAREAN SECTION     CORONARY STENT INTERVENTION N/A 08/09/2018   Procedure: CORONARY STENT INTERVENTION;  Surgeon: Wellington Hampshire, MD;  Location: [facility];  Service: Cardiovascular;  prox RCA 99% (thrombotic) stenosis (DES PCI with distal emobolization to rPAV; Synergy DES 3.5 x 20), -> rPAV 80% (distal embolization)   HERNIA REPAIR     LEFT HEART CATH AND CORONARY ANGIOGRAPHY N/A 08/09/2018   Procedure: LEFT HEART CATH AND CORONARY ANGIOGRAPHY;  Surgeon: Wellington Hampshire, MD;  Location: [facility];  prox RCA 99% (thrombotic) stenosis (DES PCI with distal emobolization to rPAV; Synergy DES), -> rPAV 80% (distal embolization). p-mCx 30%. p-m LAD 40%. EF 50-55% w/ Inferior HK.   NM MYOVIEW LTD  07/2020   EF 55 to 60%.  No EKG changes.  No ischemia or infarction.  LOW RISK.   TRANSTHORACIC ECHOCARDIOGRAM  08/10/2018   post Inf NSTEMI --> Mild concentric LVH with vigorous function.  EF 65-70%.  No RWMA.  GR 1 DD.   Cath PCI 08/09/2018: (thrombectomy & DES PCI pRCA) Synergy DES 3.5 x 20 (distal embolization to RPAV, overnight Aggrastat). EF 50-55% - inferior HK.    MEDICATIONS/ALLERGIES   Current Meds  Medication Sig   atorvastatin (LIPITOR) 40 MG tablet Take 1 tablet (40 mg total) by mouth daily.   betamethasone dipropionate 0.05 % cream Apply topically 2 (two) times daily.   Blood Glucose Monitoring Suppl (BAYER CONTOUR NEXT MONITOR) w/Device KIT 1 Device by Does not apply route 2 (two) times daily.   carvedilol (COREG) 12.5 MG tablet Take 1 tablet (12.5 mg total) by mouth 2 (two) times daily with a meal.   ciprofloxacin-dexamethasone (CIPRODEX) OTIC suspension INSTILL 4 DROPS TWICE DAILY INTO AFFECTED EAR FOR EXTERNAL OTITIS FOR 7 DAYS   clotrimazole-betamethasone (LOTRISONE) cream Apply 1 application topically 2 (two) times daily.   gentamicin cream (GARAMYCIN) 0.1 % Apply 1 application topically 2 (two) times  daily.   glucose blood (BAYER CONTOUR NEXT TEST) test strip Use as instructed   hydrocortisone cream 1 % APPLY ONE APPLICATION TOPICALLY ONCE DAILY AS NEEDED FOR ITCHING (FOR ECZEMA).   Lancet Devices (MICROLET NEXT LANCING DEVICE) MISC 1 Device by Does not apply route 2 (two) times daily.   metFORMIN (GLUCOPHAGE) 500 MG tablet TAKE 1 TABLET BY MOUTH TWICE DAILY WITH MEALS   Multiple Vitamins-Minerals (COMPLETE MULTIVITAMIN/MINERAL PO) Take by mouth.   nitroGLYCERIN (NITROSTAT) 0.4 MG SL tablet PLACE 1 TABLET UNDER THE TONGUE EVERY 5 MINUTES FOR 3 DOSES AS NEEDED FOR CHEST PAIN (Patient taking differently: Place 0.4 mg under the tongue every 5 (five) minutes as needed for chest pain. )   ticagrelor (BRILINTA) 60 MG TABS tablet Take 1 tablet (60 mg total) by mouth 2 (two) times daily.   vitamin B-12 (CYANOCOBALAMIN) 500 MCG tablet Take 500 mcg by mouth daily.   vitamin C (ASCORBIC ACID) 500 MG tablet Take 500 mg by mouth daily.   [DISCONTINUED] carvedilol (COREG) 6.25 MG tablet Take  6.25 mg  ( 1 tablet ) in the morning , anf take 12.5 mg ( 2 tablet ) in the evening  Allergies  Allergen Reactions   Contrast Media [Iodinated Diagnostic Agents] Hives, Shortness Of Breath and Itching    Pt broke out in large hives on her face after CT scan with contrast injection. Pt became slightly more short of breath than she was already after scan as well. Pt needs pre-meds prior to future studies.     SOCIAL HISTORY/FAMILY HISTORY   Reviewed in Epic:  Pertinent findings: Still no change in smoking -- thinking of patch, but has not committed to it.  Yet she still smokes about 10 cigarettes a day.  Wants to quit, but has a hard time.  OBJCTIVE -PE, EKG, labs   Wt Readings from Last 3 Encounters:  08/13/20 208 lb (94.3 kg)  08/01/20 207 lb (93.9 kg)  07/21/20 207 lb (93.9 kg)    Physical Exam: BP 123/81    Pulse 85    Ht $R'5\' 5"'wq$  (1.651 m)    Wt 208 lb (94.3 kg)    SpO2 100%    BMI 34.61  kg/m  Physical Exam Vitals reviewed.  Constitutional:      General: She is not in acute distress.    Appearance: Normal appearance. She is well-developed. She is obese. She is not ill-appearing.     Comments: Well-groomed.  Healthy-appearing  HENT:     Head: Normocephalic and atraumatic.  Neck:     Vascular: No JVD.  Cardiovascular:     Rate and Rhythm: Normal rate and regular rhythm.     Pulses: Intact distal pulses.     Heart sounds: Normal heart sounds. No murmur heard.  No friction rub. No gallop.   Pulmonary:     Effort: Pulmonary effort is normal. No respiratory distress.     Breath sounds: Normal breath sounds. No wheezing or rales.  Chest:     Chest wall: Tenderness (Still has some reproducible parasternal chest pain.) present.  Abdominal:     Palpations: Mass: No HSM.  Musculoskeletal:        General: No swelling. Normal range of motion.     Cervical back: Normal range of motion and neck supple.     Comments: Mild right calf tenderness  Neurological:     General: No focal deficit present.     Mental Status: She is alert and oriented to person, place, and time.  Psychiatric:        Mood and Affect: Mood normal.        Behavior: Behavior normal.        Thought Content: Thought content normal.        Judgment: Judgment normal.     Adult ECG Report Not checked  Recent Labs:    Apr 16, 2020  Na+ 142, K+ 3.7, Cl- 108, HCO3-24, BUN 10, Cr 0.92, Glu 160, Ca2+ 9.1; AST 15, ALT 12, AlkP 130; A1c 6.7; TSH 1.42 Lab Results  Component Value Date   CHOL 138 02/15/2020   HDL 52 02/15/2020   LDLCALC 69 02/15/2020   TRIG 93 02/15/2020   CHOLHDL 2.7 02/15/2020   Lab Results  Component Value Date   CREATININE 0.89 06/12/2020   BUN 10 06/12/2020   NA 141 06/12/2020   K 3.7 06/12/2020   CL 110 06/12/2020   CO2 23 06/12/2020   Lab Results  Component Value Date   TSH 1.36 01/27/2017   Lab Results  Component Value Date   HGBA1C 6.6 (H) 08/09/2018     ASSESSMENT/PLAN    Problem List Items Addressed This Visit  Diabetes mellitus type II, non insulin dependent (HCC) (Chronic)    A1c is 6.7.  Is on Metformin alone.  Consider SGLT2 inhibitor for added cardiovascular benefit as well.      Hyperlipidemia associated with type 2 DM - Goal LDL < 70. (Chronic)    Labs the second March.  Stable on current dose of statin. Should be due for follow-up labs at least by next March.  PCP has been ordering labs.      TOBACCO DEPENDENCE (Chronic)    .Briefly discussed smoking cessation I recommended that she try the patch, but she has not been ready to start doing that yet.  Also trouble possibly the Chantix.      Essential hypertension (Chronic)    Blood pressure has been up and down.  Simply can convert to 12 and half milligrams twice daily carvedilol.      Relevant Medications   carvedilol (COREG) 12.5 MG tablet   Coronary artery disease involving native coronary artery of native heart with angina pectoris ([place]) - Primary (Chronic)    I suspect that her chest pain episode was probably musculoskeletal.  Nonischemic Myoview.  Plan: Continue current medications but increase carvedilol to 12.5 mg twice daily.  Continue maintenance dose of Brilinta along with high-dose statin.  Okay to hold Brilinta 5-7 days preop for surgeries or procedures.  As needed NTG.      Relevant Medications   carvedilol (COREG) 12.5 MG tablet   Tachycardia    Intermittent episodes usually associate with anxiety.  Increasing beta-blocker to 1200 mg twice daily carvedilol.          COVID-19 Education: The signs and symptoms of COVID-19 were discussed with the patient and how to seek care for testing (follow up with PCP or arrange E-visit).   The importance of social distancing was discussed today.  I spent a total of 19 minutes with the patient. >  50% of the time was spent in direct patient consultation.  Additional time spent with chart review  /  charting (studies, outside notes, etc): 7 Total Time: 26 min   Current medicines are reviewed at length with the patient today.  (+/- concerns) none   Patient Instructions / Medication Changes & Studies & Tests Ordered   Patient Instructions  Medication Instructions:  INCREASE- Carvedilol 12.5 mg by mouth twice a day  *If you need a refill on your cardiac medications before your next appointment, please call your pharmacy*   Lab Work: None Ordered   Testing/Procedures: None ordered   Follow-Up: At [facility], you and your health needs are our priority.  As part of our continuing mission to provide you with exceptional heart care, we have created designated Provider Care Teams.  These Care Teams include your primary Cardiologist (physician) and Advanced Practice Providers (APPs -  Physician Assistants and Nurse Practitioners) who all work together to provide you with the care you need, when you need it.  We recommend signing up for the patient portal called "MyChart".  Sign up information is provided on this After Visit Summary.  MyChart is used to connect with patients for Virtual Visits (Telemedicine).  Patients are able to view lab/test results, encounter notes, upcoming appointments, etc.  Non-urgent messages can be sent to your provider as well.   To learn more about what you can do with MyChart, go to [email or web address].    Your next appointment:   1 year(s)  The format for your next appointment:   In Person  Provider:   You may see Glenetta Hew, MD or one of the following Advanced Practice Providers on your designated Care Team:    Rosaria Ferries, PA-C  Jory Sims, DNP, ANP  Cadence Kathlen Mody, PA-C  Studies Ordered:   No orders of the defined types were placed in this encounter.    Glenetta Hew, M.D., M.S. Interventional Cardiologist   Pager # [phone] Phone # [phone] [address]. [place], [place] [address]   Thank you  for choosing [facility]!!

## 2020-08-21 ENCOUNTER — Encounter: Payer: Self-pay | Admitting: Cardiology

## 2020-08-21 NOTE — Assessment & Plan Note (Signed)
Blood pressure has been up and down.  Simply can convert to 12 and half milligrams twice daily carvedilol.

## 2020-08-21 NOTE — Assessment & Plan Note (Signed)
Intermittent episodes usually associate with anxiety.  Increasing beta-blocker to 1200 mg twice daily carvedilol.

## 2020-08-21 NOTE — Assessment & Plan Note (Addendum)
Labs the second March.  Stable on current dose of statin. Should be due for follow-up labs at least by next March.  PCP has been ordering labs.

## 2020-08-21 NOTE — Assessment & Plan Note (Signed)
I suspect that her chest pain episode was probably musculoskeletal.  Nonischemic Myoview.  Plan: Continue current medications but increase carvedilol to 12.5 mg twice daily.  Continue maintenance dose of Brilinta along with high-dose statin.  Okay to hold Brilinta 5-7 days preop for surgeries or procedures.  As needed NTG.

## 2020-08-21 NOTE — Assessment & Plan Note (Signed)
A1c is 6.7.  Is on Metformin alone.  Consider SGLT2 inhibitor for added cardiovascular benefit as well.

## 2020-08-21 NOTE — Assessment & Plan Note (Signed)
.  Briefly discussed smoking cessation I recommended that she try the patch, but she has not been ready to start doing that yet.  Also trouble possibly the Chantix.

## 2020-09-10 ENCOUNTER — Other Ambulatory Visit: Payer: Self-pay

## 2020-09-10 ENCOUNTER — Ambulatory Visit (INDEPENDENT_AMBULATORY_CARE_PROVIDER_SITE_OTHER): Payer: PRIVATE HEALTH INSURANCE | Admitting: Podiatry

## 2020-09-10 DIAGNOSIS — L600 Ingrowing nail: Secondary | ICD-10-CM

## 2020-09-10 MED ORDER — GENTAMICIN SULFATE 0.1 % EX CREA: 1.0000 "application " | TOPICAL_CREAM | Freq: Two times a day (BID) | CUTANEOUS | 1 refills | Status: DC

## 2020-09-10 NOTE — Patient Instructions (Signed)

## 2020-09-10 NOTE — Progress Notes (Signed)
   Subjective: Patient presents today for evaluation of pain to the lateral border of the right great toe. Patient is concerned for possible ingrown nail.  Patient has a history of partial nail matricectomy to the medial border of the right great toe.  It healed uneventfully and she is doing well to the medial border.  She states that she has had pain and tenderness for the last few months to the lateral border of the right great toenail.  Patient presents today for further treatment and evaluation.  Past Medical History:  Diagnosis Date  . CAD S/P DES PCI 08/09/2018   Thrombectomy & DES PCI of pRCA 99% - Synergy DES 3.5 x 20.  . Diabetes mellitus without complication ([place])   . H/O non-ST elevation myocardial infarction (NSTEMI) 08/09/2018   99% thrombotic pRCA --> thrombectomy & DES PCI (overnight aggrastat for distal embolization to RPAV) -  Lv Gram EF 50-55% with Inferior HK -> f/u Echo with Hyperdynamic LV, EF 65-70% & no RWMA.  . Hypertension     Objective:  General: Well developed, nourished, in no acute distress, alert and oriented x3   Dermatology: Skin is warm, dry and supple bilateral.  Lateral border right great toe appears to be erythematous with evidence of an ingrowing nail. Pain on palpation noted to the border of the nail fold. The remaining nails appear unremarkable at this time. There are no open sores, lesions.  Vascular: Dorsalis Pedis artery and Posterior Tibial artery pedal pulses palpable. No lower extremity edema noted.   Neruologic: Grossly intact via light touch bilateral.  Musculoskeletal: Muscular strength within normal limits in all groups bilateral. Normal range of motion noted to all pedal and ankle joints.   Assesement: #1 Paronychia with ingrowing nail lateral border right great toe #2 Pain in toe #3 Incurvated nail  Plan of Care:  1. Patient evaluated.  2. Discussed treatment alternatives and plan of care. Explained nail avulsion procedure and post  procedure course to patient. 3. Patient opted for permanent partial nail avulsion of the lateral border right great toe.  4. Prior to procedure, local anesthesia infiltration utilized using 3 ml of a 50:50 mixture of 2% plain lidocaine and 0.5% plain marcaine in a normal hallux block fashion and a betadine prep performed.  5. Partial permanent nail avulsion with chemical matrixectomy performed using [id number] applications of phenol followed by alcohol flush.  6. Light dressing applied. 7.  Prescription for gentamicin cream.  Apply daily.   8.  Return to clinic 2 weeks.  Edrick Kins, DPM [facility]  Dr. Edrick Kins, [address]                                        [place], [place] [address]                Office [phone]  Fax [phone]

## 2020-09-15 ENCOUNTER — Other Ambulatory Visit: Payer: Self-pay | Admitting: Cardiology

## 2020-09-24 ENCOUNTER — Other Ambulatory Visit: Payer: Self-pay

## 2020-09-24 ENCOUNTER — Ambulatory Visit (INDEPENDENT_AMBULATORY_CARE_PROVIDER_SITE_OTHER): Payer: PRIVATE HEALTH INSURANCE | Admitting: Podiatry

## 2020-09-24 DIAGNOSIS — L600 Ingrowing nail: Secondary | ICD-10-CM

## 2020-09-24 DIAGNOSIS — E0843 Diabetes mellitus due to underlying condition with diabetic autonomic (poly)neuropathy: Secondary | ICD-10-CM | POA: Diagnosis not present

## 2020-09-24 NOTE — Progress Notes (Signed)
   Subjective: 59 y.o. female presents today status post permanent nail avulsion procedure of the lateral border of the right great toe that was performed on 09/10/2020.  Patient states she is doing very well.  She has been soaking her foot and applying the antibiotic cream as directed.  No new complaints at this time.   Past Medical History:  Diagnosis Date  . CAD S/P DES PCI 08/09/2018   Thrombectomy & DES PCI of pRCA 99% - Synergy DES 3.5 x 20.  . Diabetes mellitus without complication ([place])   . H/O non-ST elevation myocardial infarction (NSTEMI) 08/09/2018   99% thrombotic pRCA --> thrombectomy & DES PCI (overnight aggrastat for distal embolization to RPAV) -  Lv Gram EF 50-55% with Inferior HK -> f/u Echo with Hyperdynamic LV, EF 65-70% & no RWMA.  . Hypertension     Objective: Skin is warm, dry and supple. Nail and respective nail fold appears to be healing appropriately. Open wound to the associated nail fold with a granular wound base and moderate amount of fibrotic tissue. Minimal drainage noted. Mild erythema around the periungual region likely due to phenol chemical matricectomy.  Assessment: #1 postop permanent partial nail avulsion lateral border right great toe #2 open wound periungual nail fold of respective digit.   Plan of care: #1 patient was evaluated  #2 debridement of open wound was performed to the periungual border of the respective toe using a currette. Antibiotic ointment and Band-Aid was applied. #3 patient is to return to clinic on a PRN basis.   Edrick Kins, DPM [facility]  Dr. Edrick Kins, [address]                                        [place], [place] [address]                Office [phone]  Fax [phone]

## 2020-12-18 NOTE — Progress Notes (Signed)
Lab results: 10/30/2020 Na+ 143, K+ 3.9, Cl- 108, HCO3-21, BUN 13, Cr 1.01, Glu 126, Ca2+ 8.9; AST 29, ALT 35 (mildly elevated), AlkP 154 (elevated); A1c 6.7 (stable) TSH 1.44 (stable)  TC 148, TG 321, HDL 46, LDL 53 labs look great  Reviewed with patient by PCP.

## 2020-12-24 ENCOUNTER — Telehealth: Payer: Self-pay | Admitting: *Deleted

## 2020-12-24 NOTE — Telephone Encounter (Signed)
-----   Message from Leonie Man, MD sent at 12/18/2020 11:22 AM EST ----- Lab results: 10/30/2020 Na+ 143, K+ 3.9, Cl- 108, HCO3-21, BUN 13, Cr 1.01, Glu 126, Ca2+ 8.9; AST 29, ALT 35 (mildly elevated), AlkP 154 (elevated); A1c 6.7 (stable) TSH 1.44 (stable)  TC 148, TG 321, HDL 46, LDL 53 labs look great  Reviewed with patient by PCP.

## 2020-12-24 NOTE — Telephone Encounter (Signed)
Called an spoke to family . Request for patient to call back for results

## 2020-12-30 NOTE — Telephone Encounter (Signed)
PATIENT CALLED BACK - The patient has been notified of the result and verbalized understanding.  All questions (if any) were answered. Raiford Simmonds, RN 12/30/2020 3:25 PM

## 2021-04-22 ENCOUNTER — Ambulatory Visit: Payer: PRIVATE HEALTH INSURANCE | Admitting: Podiatry

## 2021-04-22 ENCOUNTER — Other Ambulatory Visit: Payer: Self-pay

## 2021-04-22 DIAGNOSIS — M79675 Pain in left toe(s): Secondary | ICD-10-CM | POA: Diagnosis not present

## 2021-04-22 DIAGNOSIS — E0843 Diabetes mellitus due to underlying condition with diabetic autonomic (poly)neuropathy: Secondary | ICD-10-CM | POA: Diagnosis not present

## 2021-04-22 DIAGNOSIS — L989 Disorder of the skin and subcutaneous tissue, unspecified: Secondary | ICD-10-CM | POA: Diagnosis not present

## 2021-04-22 DIAGNOSIS — B351 Tinea unguium: Secondary | ICD-10-CM | POA: Diagnosis not present

## 2021-04-22 DIAGNOSIS — M79674 Pain in right toe(s): Secondary | ICD-10-CM | POA: Diagnosis not present

## 2021-04-22 NOTE — Progress Notes (Signed)
    Subjective: Patient is a 60 y.o. female presenting to the office today for complaint of painful callus lesion(s) noted to the bilateral feet this been present for several months.  She was last seen in the office here on 09/24/2020.  She states that her ingrown toenail is doing well with no pain. Patient also complains of elongated, thickened nails that cause pain while ambulating in shoes.  She is unable to trim her own nails. Patient presents today for further treatment and evaluation.  Past Medical History:  Diagnosis Date  . CAD S/P DES PCI 08/09/2018   Thrombectomy & DES PCI of pRCA 99% - Synergy DES 3.5 x 20.  . Diabetes mellitus without complication ([place])   . H/O non-ST elevation myocardial infarction (NSTEMI) 08/09/2018   99% thrombotic pRCA --> thrombectomy & DES PCI (overnight aggrastat for distal embolization to RPAV) -  Lv Gram EF 50-55% with Inferior HK -> f/u Echo with Hyperdynamic LV, EF 65-70% & no RWMA.  . Hypertension     Objective:  Physical Exam General: Alert and oriented x3 in no acute distress  Dermatology: Hyperkeratotic lesion(s) present on the bilateral feet. Pain on palpation with a central nucleated core noted. Skin is warm, dry and supple bilateral lower extremities. Negative for open lesions or macerations. Nails are tender, long, thickened and dystrophic with subungual debris, consistent with onychomycosis, 1-5 bilateral. No signs of infection noted.  Vascular: Palpable pedal pulses bilaterally. No edema or erythema noted. Capillary refill within normal limits.  Neurological: Epicritic and protective threshold diminished bilaterally.   Musculoskeletal Exam: Pain on palpation at the keratotic lesion(s) noted. Range of motion within normal limits bilateral. Muscle strength 5/5 in all groups bilateral.  Assessment: 1. Onychodystrophic nails 1-5 bilateral with hyperkeratosis of nails.  2. Onychomycosis of nail due to dermatophyte bilateral 3.   Preulcerative calluses to the bilateral feet 4.  Diabetes mellitus with peripheral polyneuropathy   Plan of Care:  1. Patient evaluated. 2. Excisional debridement of keratoic lesion(s) using a chisel blade was performed without incident.  3. Dressed with light dressing. 4. Mechanical debridement of nails 1-5 bilaterally performed using a nail nipper. Filed with dremel without incident.  5. Patient is to return to the clinic in 3 months.   Edrick Kins, DPM [facility]  Dr. Edrick Kins, DPM    [address], [place] [address]                Office [phone]  Fax [phone]

## 2021-05-20 ENCOUNTER — Other Ambulatory Visit: Payer: Self-pay

## 2021-05-20 ENCOUNTER — Ambulatory Visit (INDEPENDENT_AMBULATORY_CARE_PROVIDER_SITE_OTHER): Payer: PRIVATE HEALTH INSURANCE | Admitting: Podiatry

## 2021-05-20 DIAGNOSIS — L600 Ingrowing nail: Secondary | ICD-10-CM | POA: Diagnosis not present

## 2021-05-20 DIAGNOSIS — L989 Disorder of the skin and subcutaneous tissue, unspecified: Secondary | ICD-10-CM | POA: Diagnosis not present

## 2021-05-20 MED ORDER — GENTAMICIN SULFATE 0.1 % EX CREA: 1.0000 "application " | TOPICAL_CREAM | Freq: Two times a day (BID) | CUTANEOUS | 1 refills | Status: AC

## 2021-05-20 NOTE — Patient Instructions (Signed)
Place 1/4 cup of epsom salts in a quart of warm tap water.  Submerge your foot or feet in the solution and soak for 20 minutes.  This soak should be done twice a day.  Next, remove your foot or feet from solution, blot dry the affected area. Apply ointment and cover if instructed by your doctor.   IF YOUR SKIN BECOMES IRRITATED WHILE USING THESE INSTRUCTIONS, IT IS OKAY TO SWITCH TO  WHITE VINEGAR AND WATER.  As another alternative soak, you may use antibacterial soap and water.  Monitor for any signs/symptoms of infection. Call the office immediately if any occur or go directly to the emergency room. Call with any questions/concerns.  Ingrown Toenail An ingrown toenail occurs when the corner or sides of a toenail grow into the surrounding skin. This causes discomfort and pain. The big toe is most commonly affected, but any of the toes can be affected. If an ingrown toenail is not treated, it can become infected. What are the causes? This condition may be caused by:  Wearing shoes that are too small or tight.  An injury, such as stubbing your toe or having your toe stepped on.  Improper cutting or care of your toenails.  Having nail or foot abnormalities that were present from birth (congenital abnormalities), such as having a nail that is too big for your toe. What increases the risk? The following factors may make you more likely to develop ingrown toenails:  Age. Nails tend to get thicker with age, so ingrown nails are more common among older people.  Cutting your toenails incorrectly, such as cutting them very short or cutting them unevenly. An ingrown toenail is more likely to get infected if you have:  Diabetes.  Blood flow (circulation) problems. What are the signs or symptoms? Symptoms of an ingrown toenail may include:  Pain, soreness, or tenderness.  Redness.  Swelling.  Hardening of the skin that surrounds the toenail. Signs that an ingrown toenail may be infected  include:  Fluid or pus.  Symptoms that get worse instead of better. How is this diagnosed? An ingrown toenail may be diagnosed based on your medical history, your symptoms, and a physical exam. If you have fluid or blood coming from your toenail, a sample may be collected to test for the specific type of bacteria that is causing the infection. How is this treated? Treatment depends on how severe your ingrown toenail is. You may be able to care for your toenail at home.  If you have an infection, you may be prescribed antibiotic medicines.  If you have fluid or pus draining from your toenail, your health care provider may drain it.  If you have trouble walking, you may be given crutches to use.  If you have a severe or infected ingrown toenail, you may need a procedure to remove part or all of the nail. Follow these instructions at home: Foot care  Do not pick at your toenail or try to remove it yourself.  Soak your foot in warm, soapy water. Do this for 20 minutes, 3 times a day, or as often as told by your health care provider. This helps to keep your toe clean and keep your skin soft.  Wear shoes that fit well and are not too tight. Your health care provider may recommend that you wear open-toed shoes while you heal.  Trim your toenails regularly and carefully. Cut your toenails straight across to prevent injury to the skin at the corners   of the toenail. Do not cut your nails in a curved shape.  Keep your feet clean and dry to help prevent infection.   Medicines  Take over-the-counter and prescription medicines only as told by your health care provider.  If you were prescribed an antibiotic, take it as told by your health care provider. Do not stop taking the antibiotic even if you start to feel better. Activity  Return to your normal activities as told by your health care provider. Ask your health care provider what activities are safe for you.  Avoid activities that cause  pain. General instructions  If your health care provider told you to use crutches to help you move around, use them as instructed.  Keep all follow-up visits as told by your health care provider. This is important. Contact a health care provider if:  You have more redness, swelling, pain, or other symptoms that do not improve with treatment.  You have fluid, blood, or pus coming from your toenail. Get help right away if:  You have a red streak on your skin that starts at your foot and spreads up your leg.  You have a fever. Summary  An ingrown toenail occurs when the corner or sides of a toenail grow into the surrounding skin. This causes discomfort and pain. The big toe is most commonly affected, but any of the toes can be affected.  If an ingrown toenail is not treated, it can become infected.  Fluid or pus draining from your toenail is a sign of infection. Your health care provider may need to drain it. You may be given antibiotics to treat the infection.  Trimming your toenails regularly and properly can help you prevent an ingrown toenail. This information is not intended to replace advice given to you by your health care provider. Make sure you discuss any questions you have with your health care provider. Document Revised: 03/23/2019 Document Reviewed: 08/17/2017 Elsevier Patient Education  2021 [employer]  

## 2021-05-26 NOTE — Progress Notes (Signed)
   Subjective: Patient presents today for a new complaint regarding pain and tenderness to the left great toe.  Patient is concerned for possible ingrown nail.  It is very sensitive to touch.   Patient also complains of pain and sensitivity to the weightbearing surfaces of bilateral feet secondary to skin lesions have developed.  They are very painful with ambulation and she would like to have them evaluated as well patient presents today for further treatment and evaluation.  Past Medical History:  Diagnosis Date   CAD S/P DES PCI 08/09/2018   Thrombectomy & DES PCI of pRCA 99% - Synergy DES 3.5 x 20.   Diabetes mellitus without complication ([place])    H/O non-ST elevation myocardial infarction (NSTEMI) 08/09/2018   99% thrombotic pRCA --> thrombectomy & DES PCI (overnight aggrastat for distal embolization to RPAV) -  Lv Gram EF 50-55% with Inferior HK -> f/u Echo with Hyperdynamic LV, EF 65-70% & no RWMA.   Hypertension     Objective:  General: Well developed, nourished, in no acute distress, alert and oriented x3   Dermatology: Skin is warm, dry and supple bilateral.  Medial and lateral border of the left great toe appears to be erythematous with evidence of an ingrowing nail. Pain on palpation noted to the border of the nail fold. The remaining nails appear unremarkable at this time. There are no open sores, lesions.  Vascular: Dorsalis Pedis artery and Posterior Tibial artery pedal pulses palpable. No lower extremity edema noted.   Neruologic: Grossly intact via light touch bilateral.  Musculoskeletal: Muscular strength within normal limits in all groups bilateral. Normal range of motion noted to all pedal and ankle joints.   Assesement: #1 Paronychia with ingrowing nail medial and lateral border left great toe #2  Porokeratosis/preulcerative callus lesion bilateral feet  Plan of Care:  1. Patient evaluated.  2. Discussed treatment alternatives and plan of care. Explained nail  avulsion procedure and post procedure course to patient. 3. Patient opted for permanent partial nail avulsion of the medial and lateral border left great toe.  4. Prior to procedure, local anesthesia infiltration utilized using 3 ml of a 50:50 mixture of 2% plain lidocaine and 0.5% plain marcaine in a normal hallux block fashion and a betadine prep performed.  5. Partial permanent nail avulsion with chemical matrixectomy performed using [id number] applications of phenol followed by alcohol flush.  6. Light dressing applied.  Post care instructions provided 7.  Prescription for gentamicin 2% cream  8.  Excisional debridement of the hyperkeratotic preulcerative callus tissue was performed using a 312 scalpel without incident or bleeding.  Patient felt immediate relief  9.  Return to clinic 2 weeks.  Edrick Kins, DPM [facility]  Dr. Edrick Kins, DPM    [address], [place] [address]                Office [phone]  Fax [phone]

## 2021-06-08 ENCOUNTER — Ambulatory Visit (INDEPENDENT_AMBULATORY_CARE_PROVIDER_SITE_OTHER): Payer: No Typology Code available for payment source | Admitting: Podiatry

## 2021-06-08 ENCOUNTER — Other Ambulatory Visit: Payer: Self-pay

## 2021-06-08 DIAGNOSIS — L600 Ingrowing nail: Secondary | ICD-10-CM

## 2021-06-08 MED ORDER — CLOTRIMAZOLE-BETAMETHASONE 1-0.05 % EX CREA: 1.0000 "application " | TOPICAL_CREAM | Freq: Two times a day (BID) | CUTANEOUS | 3 refills | Status: DC

## 2021-06-08 NOTE — Progress Notes (Signed)
   Subjective: 60 y.o. female presents today status post permanent nail avulsion procedure of the medial and lateral border of the left great toe that was performed on 05/20/2021.  Patient states that the left great toe is feeling well.  She has been soaking her foot and applying antibiotic cream as instructed.  Patient has a new complaint today regarding a vesicular lesion to the plantar aspect of the right foot.  Patient states that she gets these lesions annually.  Last time those a cream that was prescribed and helped resolve the lesions.  She denies history of trauma.  She presents for further treatment evaluation  Past Medical History:  Diagnosis Date   CAD S/P DES PCI 08/09/2018   Thrombectomy & DES PCI of pRCA 99% - Synergy DES 3.5 x 20.   Diabetes mellitus without complication ([place])    H/O non-ST elevation myocardial infarction (NSTEMI) 08/09/2018   99% thrombotic pRCA --> thrombectomy & DES PCI (overnight aggrastat for distal embolization to RPAV) -  Lv Gram EF 50-55% with Inferior HK -> f/u Echo with Hyperdynamic LV, EF 65-70% & no RWMA.   Hypertension     Objective: Skin is warm, dry and supple. Nail and respective nail fold appears to be healing appropriately. Open wound to the associated nail fold with a granular wound base and moderate amount of fibrotic tissue. Minimal drainage noted. Mild erythema around the periungual region likely due to phenol chemical matricectomy.  There is a vesicular lesion noted to the plantar aspect of the right foot some first MTPJ that is intact.  No inflammatory process noted.  Assessment: #1 s/p partial permanent nail matrixectomy left great toe medial lateral border #2 vesicular dermatitis plantar aspect of the right foot   Plan of care: #1 patient was evaluated  #2 light debridement of open wound was performed to the periungual border of the respective toe using a currette. Antibiotic ointment and Band-Aid was applied. #3  Prescription for  Lotrisone cream to apply to the vesicular lesions of the right foot  #4 patient is to return to clinic on a PRN basis.   Edrick Kins, DPM [facility]  Dr. Edrick Kins, DPM    [address], [place] [address]                Office [phone]  Fax [phone]

## 2021-07-21 ENCOUNTER — Other Ambulatory Visit: Payer: Self-pay | Admitting: Cardiology

## 2021-09-09 ENCOUNTER — Other Ambulatory Visit: Payer: Self-pay | Admitting: Cardiology

## 2021-09-20 ENCOUNTER — Other Ambulatory Visit: Payer: Self-pay | Admitting: Cardiology

## 2021-10-19 ENCOUNTER — Other Ambulatory Visit: Payer: Self-pay | Admitting: Cardiology

## 2021-10-31 ENCOUNTER — Encounter: Payer: Self-pay | Admitting: Student

## 2021-10-31 NOTE — Progress Notes (Addendum)
Cardiology Office Note:    Date:  11/02/2021   ID:  Rebecca Cuevas, DOB [birth year], MRN [id number]  PCP:  Leonard Downing, MD  Cardiologist:  Glenetta Hew, MD  Electrophysiologist:  None   Referring MD: Leonard Downing, *   Chief Complaint: follow-up of CAD  History of Present Illness:    Rebecca Cuevas is a 60 y.o. female with a history of CAD with NSTEMI in 07/2018 s/p thrombectomy and DES to proximal RCA and thrombectomy of the posterior AV groove branch, hypertension, hyperlipidemia, type 2 diabetes mellitus, and tobacco abuse who is followed by Dr. Ellyn Hack and presents today for routine follow-up.   Patient was admitted in 07/2018 with NSTEMI. Cardiac catheterization showed subtotal 99% thrombotic occlusion of the proximal RCA with very large thrombus extending ino the mid segment and evidence of embolization into the posterior AV groove branch. Patient underwent successful aspiration and DES of the proximal RCA lesion as well as aspiration thrombectomy of the posterior AV groove branch. She was treated with Aggrastat overnight following this. Echo at this time showed LVEF of 65-70% with normal wall motion and grade 1 diastolic dysfunction as well as trivial MR. Myoview was ordered in 07/2020 for recurrent chest pain which was low risk with no evidence of ischemia. Patient was last seen by Dr. Ellyn Hack in 08/2020 at which time she denied having any other chest pain.  Patient presents today for follow-up. Here alone. Patient doing relatively well since last visit. She does not occasional atypical chest pain that she describes as a pinching sensation that only last for a couple of seconds at most. This occurs at rest and with exertion but is not brought on by exertion. This is not similar to the symptoms she had prior to her MI in 2019. She also reports some dyspnea on exertion when she walks long distance which is new since last visit. Her PCP prescribed her an inhaler and she  states this helps. No orthopnea, PND, or edema. No palpitations. She notes occasional lightheadedness when her BP is high in the morning (before she takes her medications) but no syncope. She states her BP is sometimes as high as the 150s/100s. However, after she takes her medications, her BP is at goal. Her PCP recently started her on Lisinopril but she has not started this yet. She continues to smoke 10 cigarettes per day.  Past Medical History:  Diagnosis Date   CAD 08/09/2018   s/p thrombectomy and DES to proximal RCA   History of NSTEMI 08/09/2018   99% thrombotic pRCA --> thrombectomy & DES PCI (overnight aggrastat for distal embolization to RPAV) -  Lv Gram EF 50-55% with Inferior HK -> f/u Echo with Hyperdynamic LV, EF 65-70% & no RWMA.   Hyperlipidemia    Hypertension    Tobacco abuse    Type 2 diabetes mellitus ([place])     Past Surgical History:  Procedure Laterality Date   ABDOMINAL HYSTERECTOMY     CESAREAN SECTION     CORONARY STENT INTERVENTION N/A 08/09/2018   Procedure: CORONARY STENT INTERVENTION;  Surgeon: Wellington Hampshire, MD;  Location: [facility];  Service: Cardiovascular;  prox RCA 99% (thrombotic) stenosis (DES PCI with distal emobolization to rPAV; Synergy DES 3.5 x 20), -> rPAV 80% (distal embolization)   HERNIA REPAIR     LEFT HEART CATH AND CORONARY ANGIOGRAPHY N/A 08/09/2018   Procedure: LEFT HEART CATH AND CORONARY ANGIOGRAPHY;  Surgeon: Wellington Hampshire, MD;  Location: [facility];  prox RCA 99% (thrombotic) stenosis (DES PCI with distal emobolization to rPAV; Synergy DES), -> rPAV 80% (distal embolization). p-mCx 30%. p-m LAD 40%. EF 50-55% w/ Inferior HK.   NM MYOVIEW LTD  07/2020   EF 55 to 60%.  No EKG changes.  No ischemia or infarction.  LOW RISK.   TRANSTHORACIC ECHOCARDIOGRAM  08/10/2018   post Inf NSTEMI --> Mild concentric LVH with vigorous function.  EF 65-70%.  No RWMA.  GR 1 DD.    Current Medications: Current Meds  Medication Sig    albuterol (VENTOLIN HFA) 108 (90 Base) MCG/ACT inhaler Inhale into the lungs every 6 (six) hours as needed for wheezing or shortness of breath.   atorvastatin (LIPITOR) 40 MG tablet Take 1 tablet (40 mg total) by mouth daily. PATIENT MUST KEEP APPOINTMENT FOR FUTURE REFILLS.   betamethasone dipropionate 0.05 % cream Apply topically 2 (two) times daily.   Blood Glucose Monitoring Suppl (BAYER CONTOUR NEXT MONITOR) w/Device KIT 1 Device by Does not apply route 2 (two) times daily.   ciprofloxacin-dexamethasone (CIPRODEX) OTIC suspension INSTILL 4 DROPS TWICE DAILY INTO AFFECTED EAR FOR EXTERNAL OTITIS FOR 7 DAYS   clotrimazole-betamethasone (LOTRISONE) cream Apply 1 application topically 2 (two) times daily.   gentamicin cream (GARAMYCIN) 0.1 % Apply 1 application topically 2 (two) times daily.   glucose blood (BAYER CONTOUR NEXT TEST) test strip Use as instructed   hydrocortisone cream 1 % APPLY ONE APPLICATION TOPICALLY ONCE DAILY AS NEEDED FOR ITCHING (FOR ECZEMA).   Lancet Devices (MICROLET NEXT LANCING DEVICE) MISC 1 Device by Does not apply route 2 (two) times daily.   lisinopril (ZESTRIL) 10 MG tablet Take 10 mg by mouth daily.   loratadine-pseudoephedrine (CLARITIN-D 12-HOUR) 5-120 MG tablet Take 1 tablet by mouth 2 (two) times daily.   metFORMIN (GLUCOPHAGE) 500 MG tablet TAKE 1 TABLET BY MOUTH TWICE DAILY WITH MEALS   Multiple Vitamins-Minerals (COMPLETE MULTIVITAMIN/MINERAL PO) Take by mouth.   nicotine (NICODERM CQ - DOSED IN MG/24 HOURS) 14 mg/24hr patch Place 1 patch (14 mg total) onto the skin daily.   nitroGLYCERIN (NITROSTAT) 0.4 MG SL tablet PLACE 1 TABLET UNDER THE TONGUE EVERY 5 MINUTES FOR 3 DOSES AS NEEDED FOR CHEST PAIN (Patient taking differently: Place 0.4 mg under the tongue every 5 (five) minutes as needed for chest pain.)   omeprazole (PRILOSEC) 20 MG capsule Take 20 mg by mouth daily.   ticagrelor (BRILINTA) 60 MG TABS tablet TAKE 1 TABLET BY MOUTH TWICE DAILY .  APPOINTMENT REQUIRED FOR FUTURE REFILLS   vitamin B-12 (CYANOCOBALAMIN) 500 MCG tablet Take 500 mcg by mouth daily.   vitamin C (ASCORBIC ACID) 500 MG tablet Take 500 mg by mouth daily.   [DISCONTINUED] carvedilol (COREG) 12.5 MG tablet Take 1 tablet (12.5 mg total) by mouth 2 (two) times daily with a meal.   [DISCONTINUED] carvedilol (COREG) 25 MG tablet Take 25 mg by mouth 2 (two) times daily.     Allergies:   Contrast media [iodinated diagnostic agents]   Social History   Socioeconomic History   Marital status: Single    Spouse name: Not on file   Number of children: Not on file   Years of education: Not on file   Highest education level: Not on file  Occupational History   Not on file  Tobacco Use   Smoking status: Every Day    Packs/day: 0.50    Types: Cigarettes   Smokeless tobacco: Never   Tobacco comments:  8 cigs a day  Vaping Use   Vaping Use: Never used  Substance and Sexual Activity   Alcohol use: Yes    Alcohol/week: 0.0 standard drinks    Comment: occasional   Drug use: No   Sexual activity: Yes    Birth control/protection: Surgical  Other Topics Concern   Not on file  Social History Narrative   Not on file   Social Determinants of Health   Financial Resource Strain: Not on file  Food Insecurity: Not on file  Transportation Needs: Not on file  Physical Activity: Not on file  Stress: Not on file  Social Connections: Not on file     Family History: The patient's family history includes Diabetes in her father; Hypertension in her mother. There is no history of Colon cancer.  ROS:   Please see the history of present illness.     EKGs/Labs/Other Studies Reviewed:    The following studies were reviewed today:  Left Cardiac Catheterization 08/09/2018: The left ventricular systolic function is normal. LV end diastolic pressure is normal. The left ventricular ejection fraction is 50-55% by visual estimate. Prox RCA lesion is 99% stenosed. Post  intervention, there is a 0% residual stenosis. A drug-eluting stent was successfully placed using a STENT SYNERGY DES 3.5X20. Post Atrio lesion is 80% stenosed. Prox Cx to Mid Cx lesion is 30% stenosed. Prox LAD to Mid LAD lesion is 40% stenosed.   1.  Severe one-vessel coronary artery disease with subtotal thrombotic occlusion of the proximal right coronary artery with very large thrombus extending into the mid segment and evidence of embolization into the posterior AV groove branch. 2.  Low normal LV systolic function with an EF of 50% with inferior wall hypokinesis.  Normal LVEDP. 3.  Successful aspiration thrombectomy and drug-eluting stent placement to the proximal right coronary artery.  Aspiration thrombectomy was also performed on the posterior AV groove branch.  Slow flow developed after stent placement which was treated with intracoronary adenosine given distally and proximally.  There was residual thrombus in the distal RCA distribution which was left to be treated with Aggrastat overnight.   Recommendations: Dual antiplatelet therapy for at least one year. Aggressive treatment of risk factors.  Diagnostic Dominance: Right Intervention   _______________  Echocardiogram 08/09/2018: Study Conclusions: - Left ventricle: The cavity size was normal. There was mild    concentric hypertrophy. Systolic function was vigorous. The    estimated ejection fraction was in the range of 65% to 70%. Wall    motion was normal; there were no regional wall motion    abnormalities. There was an increased relative contribution of    atrial contraction to ventricular filling. Doppler parameters are    consistent with abnormal left ventricular relaxation (grade 1    diastolic dysfunction).  - Mitral valve: There was trivial regurgitation. _______________  Myoview 08/01/2020: Nuclear stress EF: 57%. No T wave inversion was noted during stress. There was no ST segment deviation noted during  stress. This is a low risk study.   Normal perfusion. LVEF 57% with normal wall motion. This is a low risk study.  EKG:  EKG ordered today. EKG personally reviewed and demonstrates normal sinus rhythm, rate 68 bpm, with no acute ST/T changes. Normal axis. Normal PR and QRS intervals. QTc 431 ms.  Recent Labs: No results found for requested labs within last 8760 hours.  Recent Lipid Panel    Component Value Date/Time   CHOL 138 02/15/2020 1000   TRIG 93  02/15/2020 1000   HDL 52 02/15/2020 1000   CHOLHDL 2.7 02/15/2020 1000   CHOLHDL 3.4 08/09/2018 1403   VLDL 9 08/09/2018 1403   LDLCALC 69 02/15/2020 1000    Physical Exam:    Vital Signs: BP 116/70   Pulse 68   Ht '5\' 6"'  (1.676 m)   Wt 206 lb 12.8 oz (93.8 kg)   SpO2 97%   BMI 33.38 kg/m     Wt Readings from Last 3 Encounters:  11/02/21 206 lb 12.8 oz (93.8 kg)  08/13/20 208 lb (94.3 kg)  08/01/20 207 lb (93.9 kg)     General: 60 y.o. female in no acute distress. HEENT: Normocephalic and atraumatic. Sclera clear.  Neck: Supple. No carotid bruits. No JVD. Heart: RRR. Distinct S1 and S2. No murmurs, gallops, or rubs. Radial pulses 2+ and equal bilaterally. Lungs: No increased work of breathing. Clear to ausculation bilaterally. No wheezes, rhonchi, or rales.  Abdomen: Soft, non-distended, and non-tender to palpation.  Extremities: No lower extremity edema.    Skin: Warm and dry. Neuro: Alert and oriented x3. No focal deficits. Psych: Normal affect. Responds appropriately.  Assessment:    1. CAD S/P percutaneous coronary angioplasty   2. Dyspnea on exertion   3. Primary hypertension   4. Hyperlipidemia, unspecified hyperlipidemia type   5. Type 2 diabetes mellitus with complication, without long-term current use of insulin ([place])   6. Tobacco abuse     Plan:    CAD History of NSTEMI in 07/2018 s/p aspiration thrombectomy and DES to proximal RCA as well as aspiration thrombectomy to posterior AV groove branch.  Myoview in 07/2020 was low risk with no evidence of ischemia.  - Patient reports atypical chest pain that last for only a couple seconds at a time but nothing that sounds like angina. - Continue maintenance dose of Brilinta 22m twice daily. - Continue beta-blocker and high-intensity statin.  Dyspnea on Exertion Patient reports dyspnea on exertion which she states is new since last visit. She denies any other signs/symptoms of CHF. Echo in 2019 showed LVEF of 65-70% with grade 1 diastolic dysfunction. PCP recently started her on albuterol inhaler which has helped. - Euvolemic on exam. - Given dyspnea improves with albuterol and no other signs of CHF, suspect it is more pulmonary in nature. Long smoking history likely contributing. Recommended complete cessation. - Patient will let [place] if dyspnea worsens or if inhalers no longer help.  Hypertension BP well controlled. - Continue Coreg 213mtwice daily. - Patient was recently started on Lisinopril 1046maily by PCP but she has not started this yet. Recommended starting this given elevated BP in the mornings and given her diabetes.  Hyperlipidemia Lipid panel in 01/2021: Total Cholesterol 118, Triglycerides 96, HDL 37, LDL 63.  - LDL goal <70 given CAD. - Continue Lipitor 20m45mily.  Type 2 Diabetes Mellitus  Hemoglobin A1c 7.1. - On Metformin at home. - Management per PCP.  Tobacco Abuse Patient has a long smoking history and continues to smoke 10 cigarettes per day. - Discussed importance of complete cessation. Will prescribe nicotine patches at patient's request. Advised patient not to smoke while wearing a patch.  Disposition: Follow up in 6 months.   Medication Adjustments/Labs and Tests Ordered: Current medicines are reviewed at length with the patient today.  Concerns regarding medicines are outlined above.  Orders Placed This Encounter  Procedures   EKG 12-Lead    Meds ordered this encounter  Medications   nicotine  (  NICODERM CQ - DOSED IN MG/24 HOURS) 14 mg/24hr patch    Sig: Place 1 patch (14 mg total) onto the skin daily.    Dispense:  28 patch    Refill:  0   carvedilol (COREG) 25 MG tablet    Sig: Take 1 tablet (25 mg total) by mouth 2 (two) times daily.    Dispense:  180 tablet    Refill:  3     Patient Instructions  Medication Instructions:  START Nicotine patches 14 mg. Apply a patch to skin once a day  *If you need a refill on your cardiac medications before your next appointment, please call your pharmacy*  Lab Work: NONE ordered at this time of appointment   If you have labs (blood work) drawn today and your tests are completely normal, you will receive your results only by: [place] (if you have MyChart) OR A paper copy in the mail If you have any lab test that is abnormal or we need to change your treatment, we will call you to review the results.  Testing/Procedures: NONE ordered at this time of appointment    Follow-Up: At [facility], you and your health needs are our priority.  As part of our continuing mission to provide you with exceptional heart care, we have created designated Provider Care Teams.  These Care Teams include your primary Cardiologist (physician) and Advanced Practice Providers (APPs -  Physician Assistants and Nurse Practitioners) who all work together to provide you with the care you need, when you need it.  We recommend signing up for the patient portal called "MyChart".  Sign up information is provided on this After Visit Summary.  MyChart is used to connect with patients for Virtual Visits (Telemedicine).  Patients are able to view lab/test results, encounter notes, upcoming appointments, etc.  Non-urgent messages can be sent to your provider as well.   To learn more about what you can do with MyChart, go to [email or web address].    Your next appointment:   6 month(s)  The format for your next appointment:   In Person  Provider:    Glenetta Hew, MD    Other Instructions    Signed, Darreld Mclean, PA-C  11/02/2021 4:52 PM    [place]

## 2021-11-02 ENCOUNTER — Other Ambulatory Visit: Payer: Self-pay

## 2021-11-02 ENCOUNTER — Ambulatory Visit: Payer: PRIVATE HEALTH INSURANCE | Admitting: Student

## 2021-11-02 ENCOUNTER — Encounter: Payer: Self-pay | Admitting: Student

## 2021-11-02 VITALS — BP 116/70 | HR 68 | Ht 66.0 in | Wt 206.8 lb

## 2021-11-02 DIAGNOSIS — E785 Hyperlipidemia, unspecified: Secondary | ICD-10-CM

## 2021-11-02 DIAGNOSIS — Z720 Tobacco use: Secondary | ICD-10-CM

## 2021-11-02 DIAGNOSIS — I10 Essential (primary) hypertension: Secondary | ICD-10-CM

## 2021-11-02 DIAGNOSIS — E118 Type 2 diabetes mellitus with unspecified complications: Secondary | ICD-10-CM

## 2021-11-02 DIAGNOSIS — Z9861 Coronary angioplasty status: Secondary | ICD-10-CM

## 2021-11-02 DIAGNOSIS — R0609 Other forms of dyspnea: Secondary | ICD-10-CM

## 2021-11-02 DIAGNOSIS — I2510 Atherosclerotic heart disease of native coronary artery without angina pectoris: Secondary | ICD-10-CM

## 2021-11-02 MED ORDER — NICOTINE 14 MG/24HR TD PT24: 14.0000 mg | MEDICATED_PATCH | Freq: Every day | TRANSDERMAL | 0 refills | Status: DC

## 2021-11-02 MED ORDER — CARVEDILOL 25 MG PO TABS: 25.0000 mg | ORAL_TABLET | Freq: Two times a day (BID) | ORAL | 3 refills | Status: DC

## 2021-11-02 NOTE — Patient Instructions (Signed)
Medication Instructions:  START Nicotine patches 14 mg. Apply a patch to skin once a day  *If you need a refill on your cardiac medications before your next appointment, please call your pharmacy*  Lab Work: NONE ordered at this time of appointment   If you have labs (blood work) drawn today and your tests are completely normal, you will receive your results only by: [place] (if you have MyChart) OR A paper copy in the mail If you have any lab test that is abnormal or we need to change your treatment, we will call you to review the results.  Testing/Procedures: NONE ordered at this time of appointment    Follow-Up: At [facility], you and your health needs are our priority.  As part of our continuing mission to provide you with exceptional heart care, we have created designated Provider Care Teams.  These Care Teams include your primary Cardiologist (physician) and Advanced Practice Providers (APPs -  Physician Assistants and Nurse Practitioners) who all work together to provide you with the care you need, when you need it.  We recommend signing up for the patient portal called "MyChart".  Sign up information is provided on this After Visit Summary.  MyChart is used to connect with patients for Virtual Visits (Telemedicine).  Patients are able to view lab/test results, encounter notes, upcoming appointments, etc.  Non-urgent messages can be sent to your provider as well.   To learn more about what you can do with MyChart, go to [email or web address].    Your next appointment:   6 month(s)  The format for your next appointment:   In Person  Provider:   Glenetta Hew, MD    Other Instructions

## 2021-11-17 ENCOUNTER — Other Ambulatory Visit: Payer: Self-pay | Admitting: Cardiology

## 2022-03-10 ENCOUNTER — Other Ambulatory Visit: Payer: Self-pay | Admitting: Family Medicine

## 2022-03-10 ENCOUNTER — Ambulatory Visit (INDEPENDENT_AMBULATORY_CARE_PROVIDER_SITE_OTHER): Payer: No Typology Code available for payment source | Admitting: Podiatry

## 2022-03-10 DIAGNOSIS — B353 Tinea pedis: Secondary | ICD-10-CM | POA: Diagnosis not present

## 2022-03-10 DIAGNOSIS — Z1231 Encounter for screening mammogram for malignant neoplasm of breast: Secondary | ICD-10-CM

## 2022-03-11 ENCOUNTER — Ambulatory Visit
Admission: RE | Admit: 2022-03-11 | Discharge: 2022-03-11 | Disposition: A | Discharge status: Home / Self Care | Payer: No Typology Code available for payment source | Source: Ambulatory Visit | Attending: Family Medicine | Admitting: Family Medicine

## 2022-03-11 DIAGNOSIS — Z1231 Encounter for screening mammogram for malignant neoplasm of breast: Secondary | ICD-10-CM

## 2022-03-14 MED ORDER — CLOTRIMAZOLE-BETAMETHASONE 1-0.05 % EX CREA: 1.0000 "application " | TOPICAL_CREAM | Freq: Two times a day (BID) | CUTANEOUS | 3 refills | Status: DC

## 2022-03-14 NOTE — Progress Notes (Signed)
? ?HPI: 61 y.o. female presenting today for recurrence of vesicular lesions to the right plantar forefoot which are very itchy and becoming somewhat painful.  She has had the lesions before and applied Lotrisone cream which has resolved her symptoms.  She presents for further treatment and evaluation ? ?Past Medical History:  ?Diagnosis Date  ? CAD 08/09/2018  ? s/p thrombectomy and DES to proximal RCA  ? History of NSTEMI 08/09/2018  ? 99% thrombotic pRCA --> thrombectomy & DES PCI (overnight aggrastat for distal embolization to RPAV) -  Lv Gram EF 50-55% with Inferior HK -> f/u Echo with Hyperdynamic LV, EF 65-70% & no RWMA.  ? Hyperlipidemia   ? Hypertension   ? Tobacco abuse   ? Type 2 diabetes mellitus ([place])   ? ? ?Past Surgical History:  ?Procedure Laterality Date  ? ABDOMINAL HYSTERECTOMY    ? CESAREAN SECTION    ? CORONARY STENT INTERVENTION N/A 08/09/2018  ? Procedure: CORONARY STENT INTERVENTION;  Surgeon: Wellington Hampshire, MD;  Location: [facility];  Service: Cardiovascular;  prox RCA 99% (thrombotic) stenosis (DES PCI with distal emobolization to rPAV; Synergy DES 3.5 x 20), -> rPAV 80% (distal embolization)  ? HERNIA REPAIR    ? LEFT HEART CATH AND CORONARY ANGIOGRAPHY N/A 08/09/2018  ? Procedure: LEFT HEART CATH AND CORONARY ANGIOGRAPHY;  Surgeon: Wellington Hampshire, MD;  Location: [facility];  prox RCA 99% (thrombotic) stenosis (DES PCI with distal emobolization to rPAV; Synergy DES), -> rPAV 80% (distal embolization). p-mCx 30%. p-m LAD 40%. EF 50-55% w/ Inferior HK.  ? NM MYOVIEW LTD  07/2020  ? EF 55 to 60%.  No EKG changes.  No ischemia or infarction.  LOW RISK.  ? TRANSTHORACIC ECHOCARDIOGRAM  08/10/2018  ? post Inf NSTEMI --> Mild concentric LVH with vigorous function.  EF 65-70%.  No RWMA.  GR 1 DD.  ? ? ?Allergies  ?Allergen Reactions  ? Contrast Media [Iodinated Contrast Media] Hives, Shortness Of Breath and Itching  ?  Pt broke out in large hives on her face after CT scan with  contrast injection. Pt became slightly more short of breath than she was already after scan as well. Pt needs pre-meds prior to future studies.   ? ? ?  ?Physical Exam: ?General: The patient is alert and oriented x3 in no acute distress. ? ?Dermatology: Skin is warm, dry and supple bilateral lower extremities.  Vesicular lesions noted to the right forefoot with significant pruritus and itching.  After debridement there is a healthy underlying granular wound base ? ?Vascular: Palpable pedal pulses bilaterally. Capillary refill within normal limits.  Negative for any significant edema or erythema ? ?Neurological: Light touch and protective threshold grossly intact ? ?Musculoskeletal Exam: No pedal deformities noted ? ?Assessment: ?1.  Vesicular tinea pedis right foot ? ? ?Plan of Care:  ?1. Patient evaluated.  Excisional debridement of the loose skin was performed and the vesicular lesions were de removed using a tissue nipper.  Patient tolerated this well. ?2.  The patient has had really good success in the past for the same condition with Lotrisone cream.  Refill prescription for Lotrisone cream apply 2 times daily ?3.  Return to clinic in 4 weeks ?  ?  ?Edrick Kins, DPM ?[place] ? ?Dr. Edrick Kins, DPM  ?  ?[address]                                        ?  [place], [place] [address]                ?Office [phone]  ?Fax [phone] ? ? ? ? ?

## 2022-04-07 ENCOUNTER — Ambulatory Visit (INDEPENDENT_AMBULATORY_CARE_PROVIDER_SITE_OTHER): Payer: No Typology Code available for payment source | Admitting: Podiatry

## 2022-04-07 DIAGNOSIS — B353 Tinea pedis: Secondary | ICD-10-CM

## 2022-04-07 MED ORDER — CLOTRIMAZOLE-BETAMETHASONE 1-0.05 % EX CREA: 1.0000 "application " | TOPICAL_CREAM | Freq: Two times a day (BID) | CUTANEOUS | 3 refills | Status: DC

## 2022-04-19 NOTE — Progress Notes (Signed)
? ?HPI: 61 y.o. female presenting today for follow-up evaluation of vesicular tinea pedis to the right plantar forefoot.  She says that there is significant improvement.  She has been applying Lotrisone cream.  No new complaints at this time ? ?Past Medical History:  ?Diagnosis Date  ? CAD 08/09/2018  ? s/p thrombectomy and DES to proximal RCA  ? History of NSTEMI 08/09/2018  ? 99% thrombotic pRCA --> thrombectomy & DES PCI (overnight aggrastat for distal embolization to RPAV) -  Lv Gram EF 50-55% with Inferior HK -> f/u Echo with Hyperdynamic LV, EF 65-70% & no RWMA.  ? Hyperlipidemia   ? Hypertension   ? Tobacco abuse   ? Type 2 diabetes mellitus ([place])   ? ? ?Past Surgical History:  ?Procedure Laterality Date  ? ABDOMINAL HYSTERECTOMY    ? CESAREAN SECTION    ? CORONARY STENT INTERVENTION N/A 08/09/2018  ? Procedure: CORONARY STENT INTERVENTION;  Surgeon: Wellington Hampshire, MD;  Location: [facility];  Service: Cardiovascular;  prox RCA 99% (thrombotic) stenosis (DES PCI with distal emobolization to rPAV; Synergy DES 3.5 x 20), -> rPAV 80% (distal embolization)  ? HERNIA REPAIR    ? LEFT HEART CATH AND CORONARY ANGIOGRAPHY N/A 08/09/2018  ? Procedure: LEFT HEART CATH AND CORONARY ANGIOGRAPHY;  Surgeon: Wellington Hampshire, MD;  Location: [facility];  prox RCA 99% (thrombotic) stenosis (DES PCI with distal emobolization to rPAV; Synergy DES), -> rPAV 80% (distal embolization). p-mCx 30%. p-m LAD 40%. EF 50-55% w/ Inferior HK.  ? NM MYOVIEW LTD  07/2020  ? EF 55 to 60%.  No EKG changes.  No ischemia or infarction.  LOW RISK.  ? TRANSTHORACIC ECHOCARDIOGRAM  08/10/2018  ? post Inf NSTEMI --> Mild concentric LVH with vigorous function.  EF 65-70%.  No RWMA.  GR 1 DD.  ? ? ?Allergies  ?Allergen Reactions  ? Contrast Media [Iodinated Contrast Media] Hives, Shortness Of Breath and Itching  ?  Pt broke out in large hives on her face after CT scan with contrast injection. Pt became slightly more short of breath  than she was already after scan as well. Pt needs pre-meds prior to future studies.   ? ?  ?Physical Exam: ?General: The patient is alert and oriented x3 in no acute distress. ? ?Dermatology: Skin is warm, dry and supple bilateral lower extremities.  The vesicular lesions noted to the right forefoot with significant pruritus and itching has improved significantly.  She no longer has the itchy burning sensation.  Overall great improvement.   ? ?Vascular: Palpable pedal pulses bilaterally. Capillary refill within normal limits.  Negative for any significant edema or erythema ? ?Neurological: Light touch and protective threshold grossly intact ? ?Musculoskeletal Exam: No pedal deformities noted ? ?Assessment: ?1.  Vesicular tinea pedis right foot; improved ? ? ?Plan of Care:  ?1. Patient evaluated.   ?2.  Continue Lotrisone cream as needed ?3.  Recommend good foot lotion daily. Foot Miracle therapeutic cream dispensed to checkout ?4.  Return to clinic as needed ?  ?Edrick Kins, DPM ?[place] ? ?Dr. Edrick Kins, DPM  ?  ?[address]                                        ?[place], [place] [address]                ?  Office [phone]  ?Fax [phone] ? ? ? ? ?

## 2022-04-21 ENCOUNTER — Other Ambulatory Visit: Payer: Self-pay | Admitting: Cardiology

## 2022-05-19 ENCOUNTER — Other Ambulatory Visit: Payer: Self-pay | Admitting: Cardiology

## 2022-05-19 ENCOUNTER — Other Ambulatory Visit: Payer: Self-pay

## 2022-06-09 ENCOUNTER — Other Ambulatory Visit: Payer: Self-pay | Admitting: Cardiology

## 2022-06-16 NOTE — Progress Notes (Unsigned)
Cardiology Clinic Note   Patient Name: Rebecca Cuevas Date of Encounter: 06/17/2022  Primary Care Provider:  Leonard Downing, MD Primary Cardiologist:  Glenetta Hew, MD  Patient Profile    Rebecca Cuevas 61 year old female presents to the clinic today for follow-up evaluation of her hypertension and coronary artery disease.  Past Medical History    Past Medical History:  Diagnosis Date   CAD 08/09/2018   s/p thrombectomy and DES to proximal RCA   History of NSTEMI 08/09/2018   99% thrombotic pRCA --> thrombectomy & DES PCI (overnight aggrastat for distal embolization to RPAV) -  Lv Gram EF 50-55% with Inferior HK -> f/u Echo with Hyperdynamic LV, EF 65-70% & no RWMA.   Hyperlipidemia    Hypertension    Tobacco abuse    Type 2 diabetes mellitus ([place])    Past Surgical History:  Procedure Laterality Date   ABDOMINAL HYSTERECTOMY     CESAREAN SECTION     CORONARY STENT INTERVENTION N/A 08/09/2018   Procedure: CORONARY STENT INTERVENTION;  Surgeon: Wellington Hampshire, MD;  Location: [facility];  Service: Cardiovascular;  prox RCA 99% (thrombotic) stenosis (DES PCI with distal emobolization to rPAV; Synergy DES 3.5 x 20), -> rPAV 80% (distal embolization)   HERNIA REPAIR     LEFT HEART CATH AND CORONARY ANGIOGRAPHY N/A 08/09/2018   Procedure: LEFT HEART CATH AND CORONARY ANGIOGRAPHY;  Surgeon: Wellington Hampshire, MD;  Location: [facility];  prox RCA 99% (thrombotic) stenosis (DES PCI with distal emobolization to rPAV; Synergy DES), -> rPAV 80% (distal embolization). p-mCx 30%. p-m LAD 40%. EF 50-55% w/ Inferior HK.   NM MYOVIEW LTD  07/2020   EF 55 to 60%.  No EKG changes.  No ischemia or infarction.  LOW RISK.   TRANSTHORACIC ECHOCARDIOGRAM  08/10/2018   post Inf NSTEMI --> Mild concentric LVH with vigorous function.  EF 65-70%.  No RWMA.  GR 1 DD.    Allergies  Allergies  Allergen Reactions   Contrast Media [Iodinated Contrast Media] Hives, Shortness  Of Breath and Itching    Pt broke out in large hives on her face after CT scan with contrast injection. Pt became slightly more short of breath than she was already after scan as well. Pt needs pre-meds prior to future studies.     History of Present Illness    Rebecca Cuevas has a PMH of HTN, HLD, type 2 diabetes, tobacco abuse, and coronary artery disease with NSTEMI 8/19 status post thrombectomy, and PCI with 2 proximal RCA DES.  Her thrombectomy was of the posterior AV groove branch.  Echocardiogram at that time showed an LVEF of 65-70%, G1 DD and trivial MR.  She underwent nuclear stress testing 8/21 for recurrent chest discomfort which showed low risk and no evidence of ischemia.  She was seen by Dr. Ellyn Hack 9/21 and denied chest discomfort.  She followed up with Sande Rives, PA-C on 11/02/2021.  She continues to do fairly well.  She did note occasional episodes of atypical chest discomfort.  She described it as a pinching sensation that would last a few seconds.  Her sensation would occur at rest and was not brought on by exertion.  Her symptoms were not felt to be anginal equivalent compared to her MI in 2019.  She did note some dyspnea on exertion with long walks which was new.  Her PCP prescribed an inhaler and she reported that it helped with her breathing.  She  denied orthopnea PND and lower extremity swelling.  She denied palpitations.  She did note occasional lightheadedness with elevated blood pressure in the morning.  She denied syncope.  She reported that her blood pressure has been as high as 150s over 100.  After taking her medication her blood pressure was at goal.  Her PCP had started her on lisinopril however, she had not yet started the medication.  She continues to smoke 10 cigarettes/day.  She presents to the clinic today for follow-up evaluation states she feels well.  She does occasionally note some heartburn which she takes Mylanta for.  She remains very physically active  walking, helping and doing transfers with senior residence.  She reports that she stopped metformin due to increased work of breathing which has resolved.  She has had recent labs drawn at her PCP.  I will request them.  We will refill her atorvastatin, have her maintain her physical activity, give a salty 6 diet sheet, and plan follow-up in 9 to 12 months.  Today she denies chest pain, shortness of breath, lower extremity edema, fatigue, palpitations, melena, hematuria, hemoptysis, diaphoresis, weakness, presyncope, syncope, orthopnea, and PND.   Home Medications    Prior to Admission medications   Medication Sig Start Date End Date Taking? Authorizing Provider  albuterol (VENTOLIN HFA) 108 (90 Base) MCG/ACT inhaler Inhale into the lungs every 6 (six) hours as needed for wheezing or shortness of breath.    [provider]  atorvastatin (LIPITOR) 40 MG tablet TAKE 1 TABLET BY MOUTH ONCE DAILY . APPOINTMENT REQUIRED FOR FUTURE REFILLS 06/10/22   Leonie Man, MD  betamethasone dipropionate 0.05 % cream Apply topically 2 (two) times daily. 04/23/20   Edrick Kins, DPM  Blood Glucose Monitoring Suppl (BAYER CONTOUR NEXT MONITOR) w/Device KIT 1 Device by Does not apply route 2 (two) times daily. 02/24/17   Donnamae Jude, MD  BRILINTA 60 MG TABS tablet TAKE 1 TABLET BY MOUTH TWICE DAILY . APPOINTMENT REQUIRED FOR FUTURE REFILLS 04/22/22   Leonie Man, MD  carvedilol (COREG) 25 MG tablet Take 1 tablet (25 mg total) by mouth 2 (two) times daily. 11/02/21   Sande Rives E, PA-C  ciprofloxacin-dexamethasone (CIPRODEX) OTIC suspension INSTILL 4 DROPS TWICE DAILY INTO AFFECTED EAR FOR EXTERNAL OTITIS FOR 7 DAYS 04/15/20   [provider]  clotrimazole-betamethasone (LOTRISONE) cream Apply 1 application. topically 2 (two) times daily. 04/07/22   Edrick Kins, DPM  gentamicin cream (GARAMYCIN) 0.1 % Apply 1 application topically 2 (two) times daily. 05/20/21   Edrick Kins, DPM   glucose blood (BAYER CONTOUR NEXT TEST) test strip Use as instructed 02/24/17   Donnamae Jude, MD  hydrocortisone cream 1 % APPLY ONE APPLICATION TOPICALLY ONCE DAILY AS NEEDED FOR ITCHING (FOR ECZEMA). 05/19/16   Donnamae Jude, MD  Lancet Devices (MICROLET NEXT LANCING DEVICE) MISC 1 Device by Does not apply route 2 (two) times daily. 02/24/17   Donnamae Jude, MD  lisinopril (ZESTRIL) 10 MG tablet Take 10 mg by mouth daily.    [provider]  loratadine-pseudoephedrine (CLARITIN-D 12-HOUR) 5-120 MG tablet Take 1 tablet by mouth 2 (two) times daily.    [provider]  metFORMIN (GLUCOPHAGE) 500 MG tablet TAKE 1 TABLET BY MOUTH TWICE DAILY WITH MEALS 09/28/19   Donnamae Jude, MD  Multiple Vitamins-Minerals (COMPLETE MULTIVITAMIN/MINERAL PO) Take by mouth.    [provider]  nicotine (NICODERM CQ - DOSED IN MG/24 HOURS) 14 mg/24hr  patch Place 1 patch (14 mg total) onto the skin daily. 11/02/21   Sande Rives E, PA-C  nitroGLYCERIN (NITROSTAT) 0.4 MG SL tablet PLACE 1 TABLET UNDER THE TONGUE EVERY 5 MINUTES FOR 3 DOSES AS NEEDED FOR CHEST PAIN Patient taking differently: Place 0.4 mg under the tongue every 5 (five) minutes as needed for chest pain. 03/10/20   Leonie Man, MD  omeprazole (PRILOSEC) 20 MG capsule Take 20 mg by mouth daily.    [provider]  vitamin B-12 (CYANOCOBALAMIN) 500 MCG tablet Take 500 mcg by mouth daily.    [provider]  vitamin C (ASCORBIC ACID) 500 MG tablet Take 500 mg by mouth daily.    [provider]    Family History    Family History  Problem Relation Age of Onset   Hypertension Mother    Diabetes Father    Colon cancer Neg Hx    She indicated that her mother is alive. She indicated that her father is deceased. She indicated that her sister is alive. She indicated that the status of her neg hx is unknown.  Social History    Social History   Socioeconomic History   Marital status: Single     Spouse name: Not on file   Number of children: Not on file   Years of education: Not on file   Highest education level: Not on file  Occupational History   Not on file  Tobacco Use   Smoking status: Every Day    Packs/day: 0.50    Types: Cigarettes   Smokeless tobacco: Never   Tobacco comments:    8 cigs a day  Vaping Use   Vaping Use: Never used  Substance and Sexual Activity   Alcohol use: Yes    Alcohol/week: 0.0 standard drinks of alcohol    Comment: occasional   Drug use: No   Sexual activity: Yes    Birth control/protection: Surgical  Other Topics Concern   Not on file  Social History Narrative   Not on file   Social Determinants of Health   Financial Resource Strain: Not on file  Food Insecurity: Not on file  Transportation Needs: Not on file  Physical Activity: Not on file  Stress: Not on file  Social Connections: Not on file  Intimate Partner Violence: Not on file     Review of Systems    General:  No chills, fever, night sweats or weight changes.  Cardiovascular:  No chest pain, dyspnea on exertion, edema, orthopnea, palpitations, paroxysmal nocturnal dyspnea. Dermatological: No rash, lesions/masses Respiratory: No cough, dyspnea Urologic: No hematuria, dysuria Abdominal:   No nausea, vomiting, diarrhea, bright red blood per rectum, melena, or hematemesis Neurologic:  No visual changes, wkns, changes in mental status. All other systems reviewed and are otherwise negative except as noted above.  Physical Exam    VS:  BP 116/78   Pulse 70   Ht '5\' 7"'  (1.702 m)   Wt 205 lb (93 kg)   SpO2 97%   BMI 32.11 kg/m  , BMI Body mass index is 32.11 kg/m. GEN: Well nourished, well developed, in no acute distress. HEENT: normal. Neck: Supple, no JVD, carotid bruits, or masses. Cardiac: RRR, no murmurs, rubs, or gallops. No clubbing, cyanosis, edema.  Radials/DP/PT 2+ and equal bilaterally.  Respiratory:  Respirations regular and unlabored, clear to  auscultation bilaterally. GI: Soft, nontender, nondistended, BS + x 4. MS: no deformity or atrophy. Skin: warm and dry, no rash. Neuro:  Strength and sensation are intact. Psych: Normal affect.  Accessory Clinical Findings    Recent Labs: No results found for requested labs within last 365 days.   Recent Lipid Panel    Component Value Date/Time   CHOL 138 02/15/2020 1000   TRIG 93 02/15/2020 1000   HDL 52 02/15/2020 1000   CHOLHDL 2.7 02/15/2020 1000   CHOLHDL 3.4 08/09/2018 1403   VLDL 9 08/09/2018 1403   LDLCALC 69 02/15/2020 1000    ECG personally reviewed by me today-normal sinus rhythm possible left atrial enlargement LVH, nonspecific T wave abnormality 70 bpm- No acute changes  Echocardiogram 08/09/2018  Study Conclusions   - Left ventricle: The cavity size was normal. There was mild    concentric hypertrophy. Systolic function was vigorous. The    estimated ejection fraction was in the range of 65% to 70%. Wall    motion was normal; there were no regional wall motion    abnormalities. There was an increased relative contribution of    atrial contraction to ventricular filling. Doppler parameters are    consistent with abnormal left ventricular relaxation (grade 1    diastolic dysfunction).  - Mitral valve: There was trivial regurgitation.   -------------------------------------------------------------------  Labs, prior tests, procedures, and surgery:  ECG.     Abnormal.   -------------------------------------------------------------------  Study data:  No prior study was available for comparison.  Study  status:  Routine.  Procedure:  The patient reported no pain pre or  post test. Transthoracic echocardiography. Image quality was  adequate.  Study completion:  There were no complications.  Transthoracic echocardiography.  M-mode, complete 2D, spectral  Doppler, and color Doppler.  Birthdate:  Patient birthdate:  05/09/1961.  Age:  Patient is 61 yr old.  Sex:   Gender: female.  BMI: 32.1 kg/m^2.  Blood pressure:     122/86  Patient status:  Inpatient.  Study date:  Study date: 08/09/2018. Study time: 02:28  PM.  Location:  Bedside.   -------------------------------------------------------------------   -------------------------------------------------------------------  Left ventricle:  The cavity size was normal. There was mild  concentric hypertrophy. Systolic function was vigorous. The  estimated ejection fraction was in the range of 65% to 70%. Wall  motion was normal; there were no regional wall motion  abnormalities. There was an increased relative contribution of  atrial contraction to ventricular filling. Doppler parameters are  consistent with abnormal left ventricular relaxation (grade 1  diastolic dysfunction).   -------------------------------------------------------------------  Aortic valve:   Trileaflet; normal thickness, mildly calcified  leaflets. Mobility was not restricted.  Doppler:  Transvalvular  velocity was within the normal range. There was no stenosis. There  was no regurgitation.   -------------------------------------------------------------------  Aorta:  Aortic root: The aortic root was normal in size.   -------------------------------------------------------------------  Mitral valve:   Structurally normal valve.   Mobility was not  restricted.  Doppler:  Transvalvular velocity was within the normal  range. There was no evidence for stenosis. There was trivial  regurgitation.    Valve area by pressure half-time: 4.89 cm^2.  Indexed valve area by pressure half-time: 2.35 cm^2/m^2.    Peak  gradient (D): 2 mm Hg.   -------------------------------------------------------------------  Left atrium:  The atrium was normal in size.   -------------------------------------------------------------------  Right ventricle:  The cavity size was normal. Wall thickness was  normal. Systolic function was normal.    -------------------------------------------------------------------  Pulmonic valve:    Structurally normal valve.   Cusp separation was  normal.  Doppler:  Transvalvular velocity  was within the normal  range. There was no evidence for stenosis. There was no  regurgitation.   -------------------------------------------------------------------  Tricuspid valve:   Structurally normal valve.    Doppler:  Transvalvular velocity was within the normal range. There was no  regurgitation.   -------------------------------------------------------------------  Pulmonary artery:   The main pulmonary artery was normal-sized.  Systolic pressure could not be accurately estimated.   -------------------------------------------------------------------  Right atrium:  The atrium was normal in size.   -------------------------------------------------------------------  Pericardium:  There was no pericardial effusion.   -------------------------------------------------------------------  Systemic veins:  Inferior vena cava: The vessel was normal in size.   Left heart cath 08/09/2018 The left ventricular systolic function is normal. LV end diastolic pressure is normal. The left ventricular ejection fraction is 50-55% by visual estimate. Prox RCA lesion is 99% stenosed. Post intervention, there is a 0% residual stenosis. A drug-eluting stent was successfully placed using a STENT SYNERGY DES 3.5X20. Post Atrio lesion is 80% stenosed. Prox Cx to Mid Cx lesion is 30% stenosed. Prox LAD to Mid LAD lesion is 40% stenosed.   1.  Severe one-vessel coronary artery disease with subtotal thrombotic occlusion of the proximal right coronary artery with very large thrombus extending into the mid segment and evidence of embolization into the posterior AV groove branch. 2.  Low normal LV systolic function with an EF of 50% with inferior wall hypokinesis.  Normal LVEDP. 3.  Successful aspiration thrombectomy and  drug-eluting stent placement to the proximal right coronary artery.  Aspiration thrombectomy was also performed on the posterior AV groove branch.  Slow flow developed after stent placement which was treated with intracoronary adenosine given distally and proximally.  There was residual thrombus in the distal RCA distribution which was left to be treated with Aggrastat overnight.   Recommendations: Dual antiplatelet therapy for at least one year. Aggressive treatment of risk factors.   Recommend uninterrupted dual antiplatelet therapy with Aspirin 30m daily and Ticagrelor 916mtwice daily for a minimum of 12 months (ACS - Class I recommendation).   Diagnostic Dominance: Right  Intervention   Nuclear stress test 08/01/2020 Nuclear stress EF: 57%. No T wave inversion was noted during stress. There was no ST segment deviation noted during stress. This is a low risk study.   Normal perfusion. LVEF 57% with normal wall motion. This is a low risk study.    Assessment & Plan   1.  Coronary artery disease-no recent episodes of chest discomfort.  Cardiac catheterization 8/19 with thrombectomy and PCI with DES to her proximal RCA.  Underwent nuclear stress test 8/21 which showed no ischemia at rest. Continue Brilinta, carvedilol, atorvastatin Heart healthy low-sodium diet-salty 6 given Increase physical activity as tolerated  Hyperlipidemia-recent lab work/cholesterol panel drawn with PCP. Continue atorvastatin Heart healthy low-sodium high-fiber diet Increase physical activity as tolerated Request recent lab work  Essential hypertension-BP today 116/78.  Well-controlled at home. Continue lisinopril, carvedilol Heart healthy low-sodium diet-salty 6 given Increase physical activity as tolerated  Tobacco abuse-continues to smoke around 10 cigarettes/day.  Strongly recommended tobacco cessation. Tobacco cessation information given  Type 2 diabetes-did not tolerate metformin due to  increased work of breathing.  Currently taking Trulicity Continue current medication regimen Heart healthy low-sodium diet-salty 6 given Increase physical activity as tolerated Follows with PCP  Disposition: Follow-up with Dr. HaEllyn Hackr me in 9-12 months.   JeJossie NgCleaver NP-C     06/17/2022, 4:01 PM [facility] [address] Office [phone]  Fax [phone]  Notice: This dictation was prepared with Dragon dictation along with smaller phrase technology. Any transcriptional errors that result from this process are unintentional and may not be corrected upon review.  I spent 14 minutes examining this patient, reviewing medications, and using patient centered shared decision making involving her cardiac care.  Prior to her visit I spent greater than 20 minutes reviewing her past medical history,  medications, and prior cardiac tests.

## 2022-06-17 ENCOUNTER — Encounter: Payer: Self-pay | Admitting: General Practice

## 2022-06-17 ENCOUNTER — Ambulatory Visit (INDEPENDENT_AMBULATORY_CARE_PROVIDER_SITE_OTHER): Payer: No Typology Code available for payment source | Admitting: General Practice

## 2022-06-17 VITALS — BP 116/78 | HR 70 | Ht 67.0 in | Wt 205.0 lb

## 2022-06-17 DIAGNOSIS — I10 Essential (primary) hypertension: Secondary | ICD-10-CM

## 2022-06-17 DIAGNOSIS — I2510 Atherosclerotic heart disease of native coronary artery without angina pectoris: Secondary | ICD-10-CM

## 2022-06-17 DIAGNOSIS — E118 Type 2 diabetes mellitus with unspecified complications: Secondary | ICD-10-CM

## 2022-06-17 DIAGNOSIS — F17200 Nicotine dependence, unspecified, uncomplicated: Secondary | ICD-10-CM | POA: Diagnosis not present

## 2022-06-17 DIAGNOSIS — E785 Hyperlipidemia, unspecified: Secondary | ICD-10-CM

## 2022-06-17 DIAGNOSIS — Z9861 Coronary angioplasty status: Secondary | ICD-10-CM

## 2022-06-17 MED ORDER — ATORVASTATIN CALCIUM 40 MG PO TABS: 40.0000 mg | ORAL_TABLET | Freq: Every day | ORAL | 3 refills | Status: DC

## 2022-06-17 NOTE — Patient Instructions (Signed)
Medication Instructions:  The current medical regimen is effective;  continue present plan and medications as directed. Please refer to the Current Medication list given to you today.   *If you need a refill on your cardiac medications before your next appointment, please call your pharmacy*  Lab Work:   Testing/Procedures:  NONE    NONE  If you have labs (blood work) drawn today and your tests are completely normal, you will receive your results only by: [place] (if you have MyChart) OR  A paper copy in the mail If you have any lab test that is abnormal or we need to change your treatment, we will call you to review the results.  Special Instructions PLEASE READ AND FOLLOW SALTY 6-ATTACHED-1,'800mg'$  daily  PLEASE MAINTAIN PHYSICAL ACTIVITY AS TOLERATED   PLEASE READ AND FOLLOW INCREASED FIBER DIET-ATTACHED  Follow-Up: Your next appointment:  12 month(s) In Person with Glenetta Hew, MD   Please call our office 2 months in advance to schedule this appointment  :1  At [facility], you and your health needs are our priority.  As part of our continuing mission to provide you with exceptional heart care, we have created designated Provider Care Teams.  These Care Teams include your primary Cardiologist (physician) and Advanced Practice Providers (APPs -  Physician Assistants and Nurse Practitioners) who all work together to provide you with the care you need, when you need it.  We recommend signing up for the patient portal called "MyChart".  Sign up information is provided on this After Visit Summary.  MyChart is used to connect with patients for Virtual Visits (Telemedicine).  Patients are able to view lab/test results, encounter notes, upcoming appointments, etc.  Non-urgent messages can be sent to your provider as well.   To learn more about what you can do with MyChart, go to [email or web address].    Important Information About Sugar             6 SALTY THINGS TO AVOID      1,'800MG'$  DAILY      High-Fiber Eating Plan Fiber, also called dietary fiber, is a type of carbohydrate. It is found foods such as fruits, vegetables, whole grains, and beans. A high-fiber diet can have many health benefits. Your health care provider may recommend a high-fiber diet to help: Prevent constipation. Fiber can make your bowel movements more regular. Lower your cholesterol. Relieve the following conditions: Inflammation of veins in the anus (hemorrhoids). Inflammation of specific areas of the digestive tract (uncomplicated diverticulosis). A problem of the large intestine, also called the colon, that sometimes causes pain and diarrhea (irritable bowel syndrome, or IBS). Prevent overeating as part of a weight-loss plan. Prevent heart disease, type 2 diabetes, and certain cancers. What are tips for following this plan? Reading food labels  Check the nutrition facts label on food products for the amount of dietary fiber. Choose foods that have 5 grams of fiber or more per serving. The goals for recommended daily fiber intake include: Men (age 41 or younger): 34-38 g. Men (over age 71): 28-34 g. Women (age 31 or younger): 25-28 g. Women (over age 35): 22-25 g. Your daily fiber goal is _____________ g. Shopping Choose whole fruits and vegetables instead of processed forms, such as apple juice or applesauce. Choose a wide variety of high-fiber foods such as avocados, lentils, oats, and kidney beans. Read the nutrition facts label of the foods you choose. Be aware of foods with added fiber. These foods often have  high sugar and sodium amounts per serving. Cooking Use whole-grain flour for baking and cooking. Cook with brown rice instead of white rice. Meal planning Start the day with a breakfast that is high in fiber, such as a cereal that contains 5 g of fiber or more per serving. Eat breads and cereals that are made with whole-grain flour instead of refined flour or white  flour. Eat brown rice, bulgur wheat, or millet instead of white rice. Use beans in place of meat in soups, salads, and pasta dishes. Be sure that half of the grains you eat each day are whole grains. General information You can get the recommended daily intake of dietary fiber by: Eating a variety of fruits, vegetables, grains, nuts, and beans. Taking a fiber supplement if you are not able to take in enough fiber in your diet. It is better to get fiber through food than from a supplement. Gradually increase how much fiber you consume. If you increase your intake of dietary fiber too quickly, you may have bloating, cramping, or gas. Drink plenty of water to help you digest fiber. Choose high-fiber snacks, such as berries, raw vegetables, nuts, and popcorn. What foods should I eat? Fruits Berries. Pears. Apples. Oranges. Avocado. Prunes and raisins. Dried figs. Vegetables Sweet potatoes. Spinach. Kale. Artichokes. Cabbage. Broccoli. Cauliflower. Green peas. Carrots. Squash. Grains Whole-grain breads. Multigrain cereal. Oats and oatmeal. Brown rice. Barley. Bulgur wheat. [place]. Quinoa. Bran muffins. Popcorn. Rye wafer crackers. Meats and other proteins Navy beans, kidney beans, and pinto beans. Soybeans. Split peas. Lentils. Nuts and seeds. Dairy Fiber-fortified yogurt. Beverages Fiber-fortified soy milk. Fiber-fortified orange juice. Other foods Fiber bars. The items listed above may not be a complete list of recommended foods and beverages. Contact a dietitian for more information. What foods should I avoid? Fruits Fruit juice. Cooked, strained fruit. Vegetables Fried potatoes. Canned vegetables. Well-cooked vegetables. Grains White bread. Pasta made with refined flour. White rice. Meats and other proteins Fatty cuts of meat. Fried chicken or fried fish. Dairy Milk. Yogurt. Cream cheese. Sour cream. Fats and oils Butters. Beverages Soft drinks. Other foods Cakes and  pastries. The items listed above may not be a complete list of foods and beverages to avoid. Talk with your dietitian about what choices are best for you. Summary Fiber is a type of carbohydrate. It is found in foods such as fruits, vegetables, whole grains, and beans. A high-fiber diet has many benefits. It can help to prevent constipation, lower blood cholesterol, aid weight loss, and reduce your risk of heart disease, diabetes, and certain cancers. Increase your intake of fiber gradually. Increasing fiber too quickly may cause cramping, bloating, and gas. Drink plenty of water while you increase the amount of fiber you consume. The best sources of fiber include whole fruits and vegetables, whole grains, nuts, seeds, and beans. This information is not intended to replace advice given to you by your health care provider. Make sure you discuss any questions you have with your health care provider. Document Revised: 04/03/2020 Document Reviewed: 04/03/2020 Elsevier Patient Education  [place].

## 2022-06-26 ENCOUNTER — Other Ambulatory Visit: Payer: Self-pay | Admitting: Cardiology

## 2022-06-28 ENCOUNTER — Telehealth: Payer: Self-pay | Admitting: Cardiology

## 2022-06-28 MED ORDER — ATORVASTATIN CALCIUM 40 MG PO TABS: 40.0000 mg | ORAL_TABLET | Freq: Every day | ORAL | 3 refills | Status: DC

## 2022-06-28 MED ORDER — TICAGRELOR 60 MG PO TABS: ORAL_TABLET | ORAL | 3 refills | Status: DC

## 2022-06-28 NOTE — Telephone Encounter (Signed)
*  STAT* If patient is at the pharmacy, call can be transferred to refill team.   1. Which medications need to be refilled? (please list name of each medication and dose if known)   BRILINTA 60 MG TABS tablet    atorvastatin (LIPITOR) 40 MG tablet    2. Which pharmacy/location (including street and city if local pharmacy) is medication to be sent to? [place] ([place]), [place] [address]  3. Do they need a 30 day or 90 day supply?  90 day   Pt had appt on 06/17/22. Pt is completely out of medications. Please advisee

## 2022-08-31 ENCOUNTER — Telehealth: Payer: Self-pay | Admitting: Cardiology

## 2022-08-31 MED ORDER — TICAGRELOR 60 MG PO TABS: ORAL_TABLET | ORAL | 2 refills | Status: DC

## 2022-08-31 NOTE — Telephone Encounter (Signed)
*  STAT* If patient is at the pharmacy, call can be transferred to refill team.   1. Which medications need to be refilled? (please list name of each medication and dose if known)  ticagrelor (BRILINTA) 60 MG TABS tablet  2. Which pharmacy/location (including street and city if local pharmacy) is medication to be sent to? [id number] [place] ([place]), [place] [address]  3. Do they need a 30 day or 90 day supply? 90 day supply   Patient ran out of medication last night.

## 2022-12-13 ENCOUNTER — Other Ambulatory Visit: Payer: Self-pay | Admitting: Student

## 2022-12-14 NOTE — Telephone Encounter (Signed)
This is Dr. Harding's pt. °

## 2023-01-30 ENCOUNTER — Ambulatory Visit
Admission: EM | Admit: 2023-01-30 | Discharge: 2023-01-30 | Disposition: A | Discharge status: Home / Self Care | Payer: Managed Care, Other (non HMO) | Attending: Emergency Medicine | Admitting: Emergency Medicine

## 2023-01-30 DIAGNOSIS — M545 Low back pain, unspecified: Secondary | ICD-10-CM

## 2023-01-30 MED ORDER — CYCLOBENZAPRINE HCL 10 MG PO TABS: 10.0000 mg | ORAL_TABLET | Freq: Two times a day (BID) | ORAL | 0 refills | Status: AC | PRN

## 2023-01-30 MED ORDER — NAPROXEN SODIUM 550 MG PO TABS: 550.0000 mg | ORAL_TABLET | Freq: Two times a day (BID) | ORAL | 0 refills | Status: AC

## 2023-01-30 NOTE — ED Provider Notes (Signed)
[facility]    CSN: [id number] Arrival date & time: 01/30/23  0808      History   Chief Complaint Chief Complaint  Patient presents with   Hip Pain   Leg Pain    HPI Rebecca Cuevas is a 62 y.o. female.   Patient presents for evaluation of right lower back pain beginning this morning upon awakening.  Pain radiates to the right hip and down the right leg stopping at the knee.  Feels that the knee is swollen.  Radiating pain is described as a tingling sensation.  Has been painful to bear weight and she is been having to hold objects to steady herself, independent at baseline.  Able to twist turn and bend.  Pain is worsened when lying flat.  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  Has attempted use of naproxen.  Denies Urinary or bowel incontinence.  Past Medical History:  Diagnosis Date   CAD 08/09/2018   s/p thrombectomy and DES to proximal RCA   History of NSTEMI 08/09/2018   99% thrombotic pRCA --> thrombectomy & DES PCI (overnight aggrastat for distal embolization to RPAV) -  Lv Gram EF 50-55% with Inferior HK -> f/u Echo with Hyperdynamic LV, EF 65-70% & no RWMA.   Hyperlipidemia    Hypertension    Tobacco abuse    Type 2 diabetes mellitus [facility])     Patient Active Problem List   Diagnosis Date Noted   Precordial pain 07/21/2020   Presence of drug coated stent in right coronary artery 08/24/2019   Hyperlipidemia 08/24/2019   CAD S/P percutaneous coronary angioplasty 08/09/2018   Callus of foot 02/04/2017   Type 2 diabetes mellitus ([place]) 11/22/2016   Thoracic back pain 11/22/2016   Arthritis 08/23/2011   Obesity, unspecified 04/29/2009   Hypertension 04/29/2009   Tobacco abuse 02/09/2007    Past Surgical History:  Procedure Laterality Date   ABDOMINAL HYSTERECTOMY     CESAREAN SECTION     CORONARY STENT INTERVENTION N/A 08/09/2018   Procedure: CORONARY STENT INTERVENTION;  Surgeon: Wellington Hampshire, MD;   Location: [facility] [facility];  Service: Cardiovascular;  prox RCA 99% (thrombotic) stenosis (DES PCI with distal emobolization to rPAV; Synergy DES 3.5 x 20), -> rPAV 80% (distal embolization)   HERNIA REPAIR     LEFT HEART CATH AND CORONARY ANGIOGRAPHY N/A 08/09/2018   Procedure: LEFT HEART CATH AND CORONARY ANGIOGRAPHY;  Surgeon: Wellington Hampshire, MD;  Location: [place] [facility];  prox RCA 99% (thrombotic) stenosis (DES PCI with distal emobolization to rPAV; Synergy DES), -> rPAV 80% (distal embolization). p-mCx 30%. p-m LAD 40%. EF 50-55% w/ Inferior HK.   NM MYOVIEW LTD  07/2020   EF 55 to 60%.  No EKG changes.  No ischemia or infarction.  LOW RISK.   TRANSTHORACIC ECHOCARDIOGRAM  08/10/2018   post Inf NSTEMI --> Mild concentric LVH with vigorous function.  EF 65-70%.  No RWMA.  GR 1 DD.    OB History   No obstetric history on file.      Home Medications    Prior to Admission medications   Medication Sig Start Date End Date Taking? Authorizing Provider  albuterol (VENTOLIN HFA) 108 (90 Base) MCG/ACT inhaler Inhale into the lungs every 6 (six) hours as needed for wheezing or shortness of breath.    [provider]  atorvastatin (LIPITOR) 40 MG tablet Take 1 tablet (40 mg total) by mouth  daily. 06/28/22   Leonie Man, MD  betamethasone dipropionate 0.05 % cream Apply topically 2 (two) times daily. 04/23/20   Edrick Kins, DPM  Blood Glucose Monitoring Suppl (BAYER CONTOUR NEXT MONITOR) w/Device KIT 1 Device by Does not apply route 2 (two) times daily. 02/24/17   Donnamae Jude, MD  carvedilol (COREG) 25 MG tablet Take 1 tablet by mouth twice daily 12/14/22   Leonie Man, MD  gentamicin cream (GARAMYCIN) 0.1 % Apply 1 application topically 2 (two) times daily. 05/20/21   Edrick Kins, DPM  glucose blood (BAYER CONTOUR NEXT TEST) test strip Use as instructed 02/24/17   Donnamae Jude, MD  hydrocortisone cream 1 % APPLY ONE APPLICATION TOPICALLY ONCE DAILY AS NEEDED FOR  ITCHING (FOR ECZEMA). 05/19/16   Donnamae Jude, MD  Lancet Devices (MICROLET NEXT LANCING DEVICE) MISC 1 Device by Does not apply route 2 (two) times daily. 02/24/17   Donnamae Jude, MD  lisinopril (ZESTRIL) 10 MG tablet Take 10 mg by mouth daily.    [provider]  loratadine-pseudoephedrine (CLARITIN-D 12-HOUR) 5-120 MG tablet Take 1 tablet by mouth 2 (two) times daily.    [provider]  metFORMIN (GLUCOPHAGE) 500 MG tablet TAKE 1 TABLET BY MOUTH TWICE DAILY WITH MEALS 09/28/19   Donnamae Jude, MD  Multiple Vitamins-Minerals (COMPLETE MULTIVITAMIN/MINERAL PO) Take by mouth.    [provider]  nitroGLYCERIN (NITROSTAT) 0.4 MG SL tablet PLACE 1 TABLET UNDER THE TONGUE EVERY 5 MINUTES FOR 3 DOSES AS NEEDED FOR CHEST PAIN Patient taking differently: Place 0.4 mg under the tongue every 5 (five) minutes as needed for chest pain. 03/10/20   Leonie Man, MD  omeprazole (PRILOSEC) 20 MG capsule Take 20 mg by mouth daily.    [provider]  ticagrelor (BRILINTA) 60 MG TABS tablet TAKE 1 TABLET BY MOUTH TWICE DAILY. 08/31/22   Leonie Man, MD  TRULICITY 3 0000000 SOPN SMARTSIG:3 Milligram(s) SUB-Q Once a Week 05/31/22   [provider]  vitamin B-12 (CYANOCOBALAMIN) 500 MCG tablet Take 500 mcg by mouth daily.    [provider]  vitamin C (ASCORBIC ACID) 500 MG tablet Take 500 mg by mouth daily.    [provider]    Family History Family History  Problem Relation Age of Onset   Hypertension Mother    Diabetes Father    Colon cancer Neg Hx     Social History Social History   Tobacco Use   Smoking status: Every Day    Packs/day: 0.50    Types: Cigarettes   Smokeless tobacco: Never   Tobacco comments:    8 cigs a day  Vaping Use   Vaping Use: Never used  Substance Use Topics   Alcohol use: Yes    Alcohol/week: 0.0 standard drinks of alcohol    Comment: occasional   Drug use: No     Allergies   Contrast media  [iodinated contrast media]   Review of Systems Review of Systems  Constitutional: Negative.   HENT: Negative.    Respiratory: Negative.    Cardiovascular: Negative.   Musculoskeletal:  Positive for back pain. Negative for arthralgias, gait problem, joint swelling, myalgias, neck pain and neck stiffness.  Skin: Negative.      Physical Exam Triage Vital Signs ED Triage Vitals  Enc Vitals Group     BP 01/30/23 0834 (!) 146/84     Pulse Rate 01/30/23 0834 (!) 59  Resp 01/30/23 0834 18     Temp 01/30/23 0834 98 F (36.7 C)     Temp Source 01/30/23 0834 Oral     SpO2 01/30/23 0834 98 %     Weight --      Height --      Head Circumference --      Peak Flow --      Pain Score 01/30/23 0833 8     Pain Loc --      Pain Edu? --      Excl. in [place]? --    No data found.  Updated Vital Signs BP (!) 146/84 (BP Location: Left Arm)   Pulse (!) 59   Temp 98 F (36.7 C) (Oral)   Resp 18   SpO2 98%   Visual Acuity Right Eye Distance:   Left Eye Distance:   Bilateral Distance:    Right Eye Near:   Left Eye Near:    Bilateral Near:     Physical Exam Constitutional:      Appearance: Normal appearance.  Eyes:     Extraocular Movements: Extraocular movements intact.  Pulmonary:     Effort: Pulmonary effort is normal.  Musculoskeletal:     Comments: Tenderness is present over the right latissimus dorsi without ecchymosis, swelling or deformity, able to twist turn and bend, positive right straight leg test, negative on the left  Neurological:     Mental Status: She is alert and oriented to person, place, and time. Mental status is at baseline.      UC Treatments / Results  Labs (all labs ordered are listed, but only abnormal results are displayed) Labs Reviewed - No data to display  EKG   Radiology No results found.  Procedures Procedures (including critical care time)  Medications Ordered in UC Medications - No data to display  Initial Impression /  Assessment and Plan / UC Course  I have reviewed the triage vital signs and the nursing notes.  Pertinent labs & imaging results that were available during my care of the patient were reviewed by me and considered in my medical decision making (see chart for details).  Acute right-sided low back pain without sciatica  Etiology is most likely muscular related to work, discussed with patient, will defer imaging at this time, declined Toradol injection in office, prescribed a higher dose of naproxen as well as Flexeril for outpatient use, recommended RICE, heat, massage, stretching and activity as tolerated, given information to orthopedics for symptoms persist or worsen, work note given Final Clinical Impressions(s) / UC Diagnoses   Final diagnoses:  None   Discharge Instructions   None    ED Prescriptions   None    PDMP not reviewed this encounter.   Hans Eden, [place] 01/30/23 [phone]

## 2023-01-30 NOTE — Discharge Instructions (Addendum)
Your pain is most likely caused by irritation to the muscles   Begin naproxen every morning and every evening for 5 days you may use as needed, this medicine helps to reduce inflammation that naturally occurs with injury and will help with your pain, may take Tylenol 500 to 1000 mg every 6 hours as needed for additional comfort  You may use muscle relaxer twice daily as needed for additional comfort, be mindful this will make you feel drowsy  You may use heating pad in 15 minute intervals as needed for additional comfort, within the first 2-3 days you may find comfort in using ice in 10-15 minutes over affected area  Begin stretching affected area daily for 10 minutes as tolerated to further loosen muscles   When lying down place pillow underneath and between knees for support  Can try sleeping without pillow on firm mattress   Practice good posture: head back, shoulders back, chest forward, pelvis back and weight distributed evenly on both legs  If pain persist after recommended treatment or reoccurs if may be beneficial to follow up with orthopedic specialist for evaluation, this doctor specializes in the bones and can manage your symptoms long-term with options such as but not limited to imaging, medications or physical therapy

## 2023-01-30 NOTE — ED Triage Notes (Signed)
Pt presents with right hip pain that radiates down right leg since waking up this morning.

## 2023-02-08 LAB — LAB REPORT - SCANNED
A1c: 7.7
EGFR: 66

## 2023-03-15 ENCOUNTER — Other Ambulatory Visit: Payer: Self-pay | Admitting: Cardiology

## 2023-04-18 ENCOUNTER — Other Ambulatory Visit: Payer: Self-pay | Admitting: Cardiology

## 2023-04-21 ENCOUNTER — Ambulatory Visit: Payer: Managed Care, Other (non HMO) | Admitting: Nurse Practitioner

## 2023-05-20 ENCOUNTER — Ambulatory Visit: Payer: Managed Care, Other (non HMO) | Attending: Nurse Practitioner | Admitting: Nurse Practitioner

## 2023-05-20 ENCOUNTER — Encounter: Payer: Self-pay | Admitting: Nurse Practitioner

## 2023-05-20 VITALS — BP 112/60 | HR 62 | Ht 66.0 in | Wt 207.6 lb

## 2023-05-20 DIAGNOSIS — I2510 Atherosclerotic heart disease of native coronary artery without angina pectoris: Secondary | ICD-10-CM

## 2023-05-20 DIAGNOSIS — I10 Essential (primary) hypertension: Secondary | ICD-10-CM | POA: Diagnosis not present

## 2023-05-20 DIAGNOSIS — E785 Hyperlipidemia, unspecified: Secondary | ICD-10-CM | POA: Diagnosis not present

## 2023-05-20 DIAGNOSIS — E118 Type 2 diabetes mellitus with unspecified complications: Secondary | ICD-10-CM

## 2023-05-20 DIAGNOSIS — Z7984 Long term (current) use of oral hypoglycemic drugs: Secondary | ICD-10-CM

## 2023-05-20 DIAGNOSIS — Z720 Tobacco use: Secondary | ICD-10-CM

## 2023-05-20 MED ORDER — TICAGRELOR 60 MG PO TABS: ORAL_TABLET | ORAL | 3 refills | Status: DC

## 2023-05-20 MED ORDER — LISINOPRIL 10 MG PO TABS: 10.0000 mg | ORAL_TABLET | Freq: Every day | ORAL | 3 refills | Status: AC

## 2023-05-20 MED ORDER — CARVEDILOL 25 MG PO TABS: ORAL_TABLET | ORAL | 3 refills | Status: DC

## 2023-05-20 NOTE — Progress Notes (Signed)
Office Visit    Patient Name: Rebecca Cuevas Date of Encounter: 05/20/2023  Primary Care Provider:  Kaleen Mask, MD Primary Cardiologist:  Bryan Lemma, MD  Chief Complaint    62 year old female with a history of CAD s/p NSTEMI, DES/thrombectomy- pRCA with thrombectomy of the posterior AV groove branch in 07/2018, hypertension, hyperlipidemia, type 2 diabetes, and tobacco use who presents for follow-up related to CAD.  Past Medical History    Past Medical History:  Diagnosis Date   CAD 08/09/2018   s/p thrombectomy and DES to proximal RCA   History of NSTEMI 08/09/2018   99% thrombotic pRCA --> thrombectomy & DES PCI (overnight aggrastat for distal embolization to RPAV) -  Lv Gram EF 50-55% with Inferior HK -> f/u Echo with Hyperdynamic LV, EF 65-70% & no RWMA.   Hyperlipidemia    Hypertension    Tobacco abuse    Type 2 diabetes mellitus (HCC)    Past Surgical History:  Procedure Laterality Date   ABDOMINAL HYSTERECTOMY     CESAREAN SECTION     CORONARY STENT INTERVENTION N/A 08/09/2018   Procedure: CORONARY STENT INTERVENTION;  Surgeon: Iran Ouch, MD;  Location: [facility];  Service: Cardiovascular;  prox RCA 99% (thrombotic) stenosis (DES PCI with distal emobolization to rPAV; Synergy DES 3.5 x 20), -> rPAV 80% (distal embolization)   HERNIA REPAIR     LEFT HEART CATH AND CORONARY ANGIOGRAPHY N/A 08/09/2018   Procedure: LEFT HEART CATH AND CORONARY ANGIOGRAPHY;  Surgeon: Iran Ouch, MD;  Location: [facility];  prox RCA 99% (thrombotic) stenosis (DES PCI with distal emobolization to rPAV; Synergy DES), -> rPAV 80% (distal embolization). p-mCx 30%. p-m LAD 40%. EF 50-55% w/ Inferior HK.   NM MYOVIEW LTD  07/2020   EF 55 to 60%.  No EKG changes.  No ischemia or infarction.  LOW RISK.   TRANSTHORACIC ECHOCARDIOGRAM  08/10/2018   post Inf NSTEMI --> Mild concentric LVH with vigorous function.  EF 65-70%.  No RWMA.  GR 1 DD.     Allergies  Allergies  Allergen Reactions   Contrast Media [Iodinated Contrast Media] Hives, Shortness Of Breath and Itching    Pt broke out in large hives on her face after CT scan with contrast injection. Pt became slightly more short of breath than she was already after scan as well. Pt needs pre-meds prior to future studies.      Labs/Other Studies Reviewed    The following studies were reviewed today:  Cardiac Studies & Procedures   CARDIAC CATHETERIZATION  CARDIAC CATHETERIZATION 08/09/2018  Narrative  The left ventricular systolic function is normal.  LV end diastolic pressure is normal.  The left ventricular ejection fraction is 50-55% by visual estimate.  Prox RCA lesion is 99% stenosed.  Post intervention, there is a 0% residual stenosis.  A drug-eluting stent was successfully placed using a STENT SYNERGY DES 3.5X20.  Post Atrio lesion is 80% stenosed.  Prox Cx to Mid Cx lesion is 30% stenosed.  Prox LAD to Mid LAD lesion is 40% stenosed.  1.  Severe one-vessel coronary artery disease with subtotal thrombotic occlusion of the proximal right coronary artery with very large thrombus extending into the mid segment and evidence of embolization into the posterior AV groove branch. 2.  Low normal LV systolic function with an EF of 50% with inferior wall hypokinesis.  Normal LVEDP. 3.  Successful aspiration thrombectomy and drug-eluting stent placement to the proximal right coronary  artery.  Aspiration thrombectomy was also performed on the posterior AV groove branch.  Slow flow developed after stent placement which was treated with intracoronary adenosine given distally and proximally.  There was residual thrombus in the distal RCA distribution which was left to be treated with Aggrastat overnight.  Recommendations: Dual antiplatelet therapy for at least one year. Aggressive treatment of risk factors.  Recommend uninterrupted dual antiplatelet therapy with  Aspirin 81mg  daily and Ticagrelor 90mg  twice daily for a minimum of 12 months (ACS - Class I recommendation).  Findings Coronary Findings Diagnostic  Dominance: Right  Left Main Vessel is angiographically normal.  Left Anterior Descending Prox LAD to Mid LAD lesion is 40% stenosed.  Left Circumflex Vessel is angiographically normal. Prox Cx to Mid Cx lesion is 30% stenosed.  First Obtuse Marginal Branch Vessel is angiographically normal.  Second Obtuse Marginal Branch Vessel is angiographically normal.  Third Obtuse Marginal Branch Vessel is angiographically normal.  Right Coronary Artery Prox RCA lesion is 99% stenosed. Vessel is the culprit lesion. The lesion is type C, thrombotic and heavily thrombotic with left-to-right collateral flow.  Right Posterior Atrioventricular Artery Post Atrio lesion is 80% stenosed.  Intervention  Prox RCA lesion Thrombectomy WIRE RUNTHROUGH .[id number] guidewire was used to cross lesion. Aspiration thrombectomy performed using a CATH PRONTO V4 5.74F. 4 passes taken. Stent Pre-stent angioplasty was performed using a BALLOON EMERGE MR 2.5X12. Maximum pressure:  12 atm. Inflation time:  30 sec. A drug-eluting stent was successfully placed using a STENT SYNERGY DES 3.5X20. Maximum pressure: 14 atm. Inflation time: 20 sec. Post-stent angioplasty was performed using a BALLOON Gerlach EMERGE MR 4.0X15. Maximum pressure:  12 atm. Inflation time:  15 sec. Post-Intervention Lesion Assessment The intervention was successful. Pre-interventional TIMI flow is 1. Post-intervention TIMI flow is 3. Treated lesion length:  18 mm. No-reflow occurred during the intervention. IC adenosine was given. There is a 0% residual stenosis post intervention.   STRESS TESTS  MYOCARDIAL PERFUSION IMAGING 08/01/2020  Narrative  Nuclear stress EF: 57%.  No T wave inversion was noted during stress.  There was no ST segment deviation noted during stress.  This is a low  risk study.  Normal perfusion. LVEF 57% with normal wall motion. This is a low risk study.   ECHOCARDIOGRAM  ECHOCARDIOGRAM COMPLETE 08/09/2018  Narrative *[facility]* *[facility]* [address] [place], [place] [address] [phone]  ------------------------------------------------------------------- Transthoracic Echocardiography  Patient:    Wonder, Donaway MR #:       [id number] Study Date: 08/09/2018 Gender:     F Age:        48 Height:     167.6 cm Weight:     90.1 kg BSA:        2.08 m^2 Pt. Status: Room:       [facility]  SONOGRAPHER  Lavenia Atlas, RCS PERFORMING   [facility], Inpatient ADMITTING    Wilma Flavin ATTENDING    [place], Jedrek E ORDERING     [place], Jedrek E REFERRING    [place], Jedrek E  cc:  ------------------------------------------------------------------- LV EF: 65% -   70%  ------------------------------------------------------------------- Indications:      CAD of native vessels 414.01.  ------------------------------------------------------------------- History:   PMH:   Acute myocardial infarction.  Risk factors: Current tobacco use. Hypertension. Diabetes mellitus.  ------------------------------------------------------------------- Study Conclusions  - Left ventricle: The cavity size was normal. There was mild concentric hypertrophy. Systolic function was vigorous. The estimated ejection fraction was in the range of 65%  to 70%. Wall motion was normal; there were no regional wall motion abnormalities. There was an increased relative contribution of atrial contraction to ventricular filling. Doppler parameters are consistent with abnormal left ventricular relaxation (grade 1 diastolic dysfunction). - Mitral valve: There was trivial regurgitation.  ------------------------------------------------------------------- Labs, prior tests, procedures, and surgery: ECG.      Abnormal.  ------------------------------------------------------------------- Study data:  No prior study was available for comparison.  Study status:  Routine.  Procedure:  The patient reported no pain pre or post test. Transthoracic echocardiography. Image quality was adequate.  Study completion:  There were no complications. Transthoracic echocardiography.  M-mode, complete 2D, spectral Doppler, and color Doppler.  Birthdate:  Patient birthdate: 12-16-1960.  Age:  Patient is 62 yr old.  Sex:  Gender: female. BMI: 32.1 kg/m^2.  Blood pressure:     122/86  Patient status: Inpatient.  Study date:  Study date: 08/09/2018. Study time: 02:28 PM.  Location:  Bedside.  -------------------------------------------------------------------  ------------------------------------------------------------------- Left ventricle:  The cavity size was normal. There was mild concentric hypertrophy. Systolic function was vigorous. The estimated ejection fraction was in the range of 65% to 70%. Wall motion was normal; there were no regional wall motion abnormalities. There was an increased relative contribution of atrial contraction to ventricular filling. Doppler parameters are consistent with abnormal left ventricular relaxation (grade 1 diastolic dysfunction).  ------------------------------------------------------------------- Aortic valve:   Trileaflet; normal thickness, mildly calcified leaflets. Mobility was not restricted.  Doppler:  Transvalvular velocity was within the normal range. There was no stenosis. There was no regurgitation.  ------------------------------------------------------------------- Aorta:  Aortic root: The aortic root was normal in size.  ------------------------------------------------------------------- Mitral valve:   Structurally normal valve.   Mobility was not restricted.  Doppler:  Transvalvular velocity was within the normal range. There was no evidence for  stenosis. There was trivial regurgitation.    Valve area by pressure half-time: 4.89 cm^2. Indexed valve area by pressure half-time: 2.35 cm^2/m^2.    Peak gradient (D): 2 mm Hg.  ------------------------------------------------------------------- Left atrium:  The atrium was normal in size.  ------------------------------------------------------------------- Right ventricle:  The cavity size was normal. Wall thickness was normal. Systolic function was normal.  ------------------------------------------------------------------- Pulmonic valve:    Structurally normal valve.   Cusp separation was normal.  Doppler:  Transvalvular velocity was within the normal range. There was no evidence for stenosis. There was no regurgitation.  ------------------------------------------------------------------- Tricuspid valve:   Structurally normal valve.    Doppler: Transvalvular velocity was within the normal range. There was no regurgitation.  ------------------------------------------------------------------- Pulmonary artery:   The main pulmonary artery was normal-sized. Systolic pressure could not be accurately estimated.  ------------------------------------------------------------------- Right atrium:  The atrium was normal in size.  ------------------------------------------------------------------- Pericardium:  There was no pericardial effusion.  ------------------------------------------------------------------- Systemic veins: Inferior vena cava: The vessel was normal in size.  ------------------------------------------------------------------- Measurements  Left ventricle                           Value          Reference LV ID, ED, PLAX chordal                  43    mm       43 - 52 LV ID, ES, PLAX chordal                  29    mm       23 - 38 LV fx  shortening, PLAX chordal           33    %        >=29 LV PW thickness, ED                      11    mm        --------- IVS/LV PW ratio, ED                      1              <=1.3 Stroke volume, 2D                        67    ml       --------- Stroke volume/bsa, 2D                    32    ml/m^2   --------- LV e&', lateral                           8.05  cm/s     --------- LV E/e&', lateral                         8.92           --------- LV e&', medial                            4.57  cm/s     --------- LV E/e&', medial                          15.71          --------- LV e&', average                           6.31  cm/s     --------- LV E/e&', average                         11.38          ---------  Ventricular septum                       Value          Reference IVS thickness, ED                        11    mm       ---------  LVOT                                     Value          Reference LVOT ID, S                               21    mm       --------- LVOT area  3.46  cm^2     --------- LVOT peak velocity, S                    117   cm/s     --------- LVOT mean velocity, S                    77.8  cm/s     --------- LVOT VTI, S                              19.3  cm       --------- LVOT peak gradient, S                    5     mm Hg    ---------  Aorta                                    Value          Reference Aortic root ID, ED                       30    mm       ---------  Left atrium                              Value          Reference LA ID, A-P, ES                           31    mm       --------- LA ID/bsa, A-P                           1.49  cm/m^2   <=2.2 LA volume, S                             34    ml       --------- LA volume/bsa, S                         16.3  ml/m^2   --------- LA volume, ES, 1-p A4C                   36.9  ml       --------- LA volume/bsa, ES, 1-p A4C               17.7  ml/m^2   --------- LA volume, ES, 1-p A2C                   26.8  ml       --------- LA volume/bsa, ES, 1-p A2C               12.9  ml/m^2    ---------  Mitral valve                             Value          Reference Mitral E-wave peak velocity  71.8  cm/s     --------- Mitral A-wave peak velocity              108   cm/s     --------- Mitral deceleration time                 155   ms       150 - 230 Mitral pressure half-time                45    ms       --------- Mitral peak gradient, D                  2     mm Hg    --------- Mitral E/A ratio, peak                   0.7            --------- Mitral valve area, PHT, DP               4.89  cm^2     --------- Mitral valve area/bsa, PHT, DP           2.35  cm^2/m^2 ---------  Right atrium                             Value          Reference RA ID, S-I, ES, A4C                      45.7  mm       34 - 49 RA area, ES, A4C                         15.1  cm^2     8.3 - 19.5 RA volume, ES, A/L                       40    ml       --------- RA volume/bsa, ES, A/L                   19.2  ml/m^2   ---------  Right ventricle                          Value          Reference RV ID, minor axis, ED, A4C               20    mm       --------- base TAPSE                                    19.9  mm       --------- RV s&', lateral, S                        15.5  cm/s     ---------  Legend: (L)  and  (H)  mark values outside specified reference range.  ------------------------------------------------------------------- Prepared and Electronically Authenticated by  Armanda Magic, MD 2019-08-28T15:34:01            Recent Labs: No results found for requested labs within last 365 days.  Recent Lipid Panel  Component Value Date/Time   CHOL 138 02/15/2020 1000   TRIG 93 02/15/2020 1000   HDL 52 02/15/2020 1000   CHOLHDL 2.7 02/15/2020 1000   CHOLHDL 3.4 08/09/2018 1403   VLDL 9 08/09/2018 1403   LDLCALC 69 02/15/2020 1000    History of Present Illness    62 year old female with the above past medical history including CAD s/p NSTEMI, DES/thrombectomy-pRCA with  thrombectomy of the posterior AV groove branch in 07/2018, hypertension, hyperlipidemia, type 2 diabetes, and tobacco use.  She was hospitalized in August 2019 in the setting of NSTEMI.  Cardiac catheterization showed subtotal 99% thrombotic occlusion of the proximal RCA with very large thrombus extending into the mid segment, evidence of embolization into the posterior AV groove branch.  She underwent successful aspiration and DES of the proximal RCA lesion as well as aspiration thrombectomy of the posterior AV groove branch.  Echocardiogram at the time showed EF 65 to 70%, normal wall motion, G1 DD, trivial MR.  Myoview in 2021 in the setting of recurrent chest pain was low risk, no evidence of ischemia.  She was last seen in the office on 06/17/2022 and was stable from a cardiac standpoint.  She denied symptoms concerning for angina.  BP was well-controlled.  She continued to smoke at the time.  She presents today for follow-up.  Since her last visit she has done well from a cardiac standpoint.  She denies any symptoms concerning for angina.  She does note an occasional cough with increased mucus production, denies dyspnea, edema, PND, orthopnea, weight gain.  She continues to smoke.  Overall, she reports feeling well.  Home Medications    Current Outpatient Medications  Medication Sig Dispense Refill   atorvastatin (LIPITOR) 40 MG tablet Take 1 tablet (40 mg total) by mouth daily. 90 tablet 3   glucose blood (BAYER CONTOUR NEXT TEST) test strip Use as instructed 100 each 12   Lancet Devices (MICROLET NEXT LANCING DEVICE) MISC 1 Device by Does not apply route 2 (two) times daily. 1 each 1   omeprazole (PRILOSEC) 20 MG capsule Take 20 mg by mouth daily.     vitamin B-12 (CYANOCOBALAMIN) 500 MCG tablet Take 500 mcg by mouth daily.     albuterol (VENTOLIN HFA) 108 (90 Base) MCG/ACT inhaler Inhale into the lungs every 6 (six) hours as needed for wheezing or shortness of breath. (Patient not taking:  Reported on 05/20/2023)     betamethasone dipropionate 0.05 % cream Apply topically 2 (two) times daily. (Patient not taking: Reported on 05/20/2023) 60 g 2   Blood Glucose Monitoring Suppl (BAYER CONTOUR NEXT MONITOR) w/Device KIT 1 Device by Does not apply route 2 (two) times daily. (Patient not taking: Reported on 05/20/2023) 1 kit 0   carvedilol (COREG) 25 MG tablet TAKE 1 TABLET BY MOUTH TWICE DAILY 90 tablet 3   cyclobenzaprine (FLEXERIL) 10 MG tablet Take 1 tablet (10 mg total) by mouth 2 (two) times daily as needed for muscle spasms. (Patient not taking: Reported on 05/20/2023) 20 tablet 0   gentamicin cream (GARAMYCIN) 0.1 % Apply 1 application topically 2 (two) times daily. (Patient not taking: Reported on 05/20/2023) 15 g 1   hydrocortisone cream 1 % APPLY ONE APPLICATION TOPICALLY ONCE DAILY AS NEEDED FOR ITCHING (FOR ECZEMA). (Patient not taking: Reported on 05/20/2023) 60 g 3   lisinopril (ZESTRIL) 10 MG tablet Take 1 tablet (10 mg total) by mouth daily. 90 tablet 3   loratadine-pseudoephedrine (CLARITIN-D 12-HOUR) 5-120 MG tablet Take  1 tablet by mouth 2 (two) times daily. (Patient not taking: Reported on 05/20/2023)     metFORMIN (GLUCOPHAGE) 500 MG tablet TAKE 1 TABLET BY MOUTH TWICE DAILY WITH MEALS (Patient not taking: Reported on 05/20/2023) 180 tablet 0   Multiple Vitamins-Minerals (COMPLETE MULTIVITAMIN/MINERAL PO) Take by mouth. (Patient not taking: Reported on 05/20/2023)     naproxen sodium (ANAPROX DS) 550 MG tablet Take 1 tablet (550 mg total) by mouth 2 (two) times daily with a meal. (Patient not taking: Reported on 05/20/2023) 30 tablet 0   nitroGLYCERIN (NITROSTAT) 0.4 MG SL tablet PLACE 1 TABLET UNDER THE TONGUE EVERY 5 MINUTES FOR 3 DOSES AS NEEDED FOR CHEST PAIN (Patient not taking: Reported on 05/20/2023) 25 tablet 4   ticagrelor (BRILINTA) 60 MG TABS tablet TAKE 1 TABLET BY MOUTH TWICE DAILY. 180 tablet 3   TRULICITY 3 MG/0.5ML SOPN SMARTSIG:3 Milligram(s) SUB-Q Once a Week (Patient not  taking: Reported on 05/20/2023)     vitamin C (ASCORBIC ACID) 500 MG tablet Take 500 mg by mouth daily. (Patient not taking: Reported on 05/20/2023)     No current facility-administered medications for this visit.     Review of Systems    She denies chest pain, palpitations, dyspnea, pnd, orthopnea, n, v, dizziness, syncope, edema, weight gain, or early satiety. All other systems reviewed and are otherwise negative except as noted above.   Physical Exam    VS:  BP 112/60 (BP Location: Left Arm, Patient Position: Sitting, Cuff Size: Large)   Pulse 62   Ht 5\' 6"  (1.676 m)   Wt 207 lb 9.6 oz (94.2 kg)   SpO2 98%   BMI 33.51 kg/m  GEN: Well nourished, well developed, in no acute distress. HEENT: normal. Neck: Supple, no JVD, carotid bruits, or masses. Cardiac: RRR, no murmurs, rubs, or gallops. No clubbing, cyanosis, edema.  Radials/DP/PT 2+ and equal bilaterally.  Respiratory:  Respirations regular and unlabored, clear to auscultation bilaterally. GI: Soft, nontender, nondistended, BS + x 4. MS: no deformity or atrophy. Skin: warm and dry, no rash. Neuro:  Strength and sensation are intact. Psych: Normal affect.  Accessory Clinical Findings    ECG personally reviewed by me today -NSR, 62 bpm- no acute changes.   Lab Results  Component Value Date   WBC 8.3 06/12/2020   HGB 11.4 (L) 06/12/2020   HCT 36.7 06/12/2020   MCV 85.2 06/12/2020   PLT 219 06/12/2020   Lab Results  Component Value Date   CREATININE 0.89 06/12/2020   BUN 10 06/12/2020   NA 141 06/12/2020   K 3.7 06/12/2020   CL 110 06/12/2020   CO2 23 06/12/2020   Lab Results  Component Value Date   ALT 27 09/04/2019   AST 26 09/04/2019   ALKPHOS 135 (H) 09/04/2019   BILITOT 0.2 09/04/2019   Lab Results  Component Value Date   CHOL 138 02/15/2020   HDL 52 02/15/2020   LDLCALC 69 02/15/2020   TRIG 93 02/15/2020   CHOLHDL 2.7 02/15/2020    Lab Results  Component Value Date   HGBA1C 6.6 (H) 08/09/2018     Assessment & Plan    1. CAD: S/p NSTEMI, DES/thrombectomy-pRCA with thrombectomy of the posterior AV groove branch in 07/2018.  Stable with no anginal symptoms. No indication for ischemic evaluation. Continue Brilinta, lisinopril, carvedilol, and Lipitor.  2. Hypertension: BP well controlled. Continue current antihypertensive regimen.   3. Hyperlipidemia: LDL was 71 in 02/2022.  She notes she had labs checked  at her annual physical in 02/2023. Will request a copy of most recent labs.  Continue Lipitor.  4. Type 2 diabetes: A1c was 6.5 in 02/2022.  Monitored and managed per PCP.  5. Tobacco use: She continues to smoke. Full cessation advised.  6. Disposition: Follow-up in 1 year.     Joylene Grapes, NP 05/20/2023, 3:09 PM

## 2023-05-20 NOTE — Patient Instructions (Addendum)
Medication Instructions:  Your physician recommends that you continue on your current medications as directed. Please refer to the Current Medication list given to you today.  *If you need a refill on your cardiac medications before your next appointment, please call your pharmacy*   Lab Work:  NONE ordered at this time of appointment   Testing/Procedures: NONE ordered at this time of appointment   Follow-Up: At [facility], you and your health needs are our priority.  As part of our continuing mission to provide you with exceptional heart care, we have created designated Provider Care Teams.  These Care Teams include your primary Cardiologist (physician) and Advanced Practice Providers (APPs -  Physician Assistants and Nurse Practitioners) who all work together to provide you with the care you need, when you need it.  We recommend signing up for the patient portal called "MyChart".  Sign up information is provided on this After Visit Summary.  MyChart is used to connect with patients for Virtual Visits (Telemedicine).  Patients are able to view lab/test results, encounter notes, upcoming appointments, etc.  Non-urgent messages can be sent to your provider as well.   To learn more about what you can do with MyChart, go to [email or web address].    Your next appointment:   1 year   Provider:   Dr. Herbie Baltimore     Other Instructions

## 2023-05-23 ENCOUNTER — Encounter: Payer: Self-pay | Admitting: Nurse Practitioner

## 2023-06-29 LAB — LAB REPORT - SCANNED
A1c: 6.7
EGFR: 76

## 2023-07-02 NOTE — Progress Notes (Signed)
Labs from 02/08/2023 Na+ 141, K+ 4.7, Cl- 109, HCO3-21, BUN 14, Cr 0.97, Glu 120, Ca2+ 8.8; AST 29, ALT 23, AlkP 141 TC 142, TG 84, HDL 55, LDL 71; A1c 7.7  => From 08/31/2022: LFTs ALT 1.9, AST 131, alk phos 159; TC 118, TG 77, HDL 49, LDL 53; A1c 6.6.  Lipid function improved, but A1c went up as did LDL.!  I have not seen her in quite a while.  Would need to get the story behind the change in LFTs, A1c and lipids.  Most recently seen by Edd Fabian, NP  Bryan Lemma, MD

## 2023-07-06 IMAGING — MG MM DIGITAL SCREENING BILAT W/ TOMO AND CAD
8 series · 8 of 24 positions shown · non-contrast
Comparison: Previous exam(s).

CLINICAL DATA: Screening.

EXAM:
DIGITAL SCREENING BILATERAL MAMMOGRAM WITH TOMOSYNTHESIS AND CAD
TECHNIQUE: Bilateral screening digital craniocaudal and mediolateral oblique
mammograms were obtained. Bilateral screening digital breast
tomosynthesis was performed. The images were evaluated with
computer-aided detection.

[L CC synth-2D]
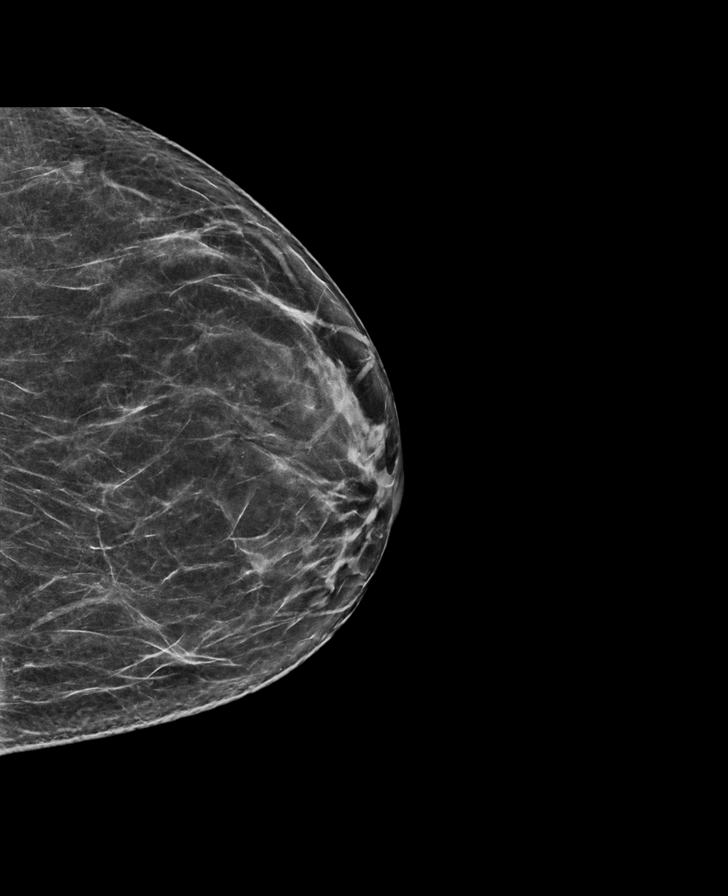

[R CC synth-2D]
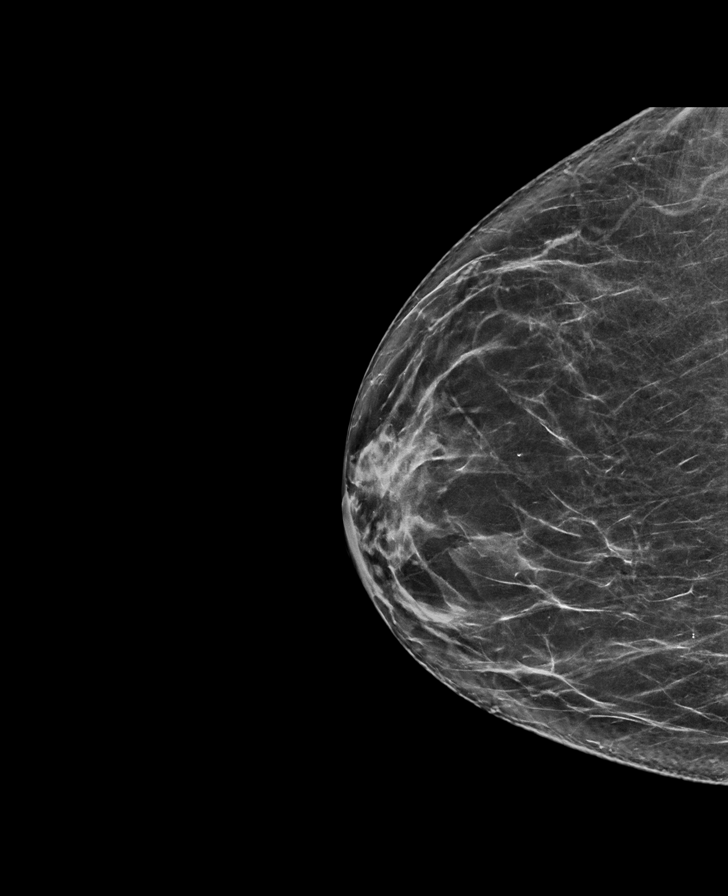

[R MLO synth-2D]
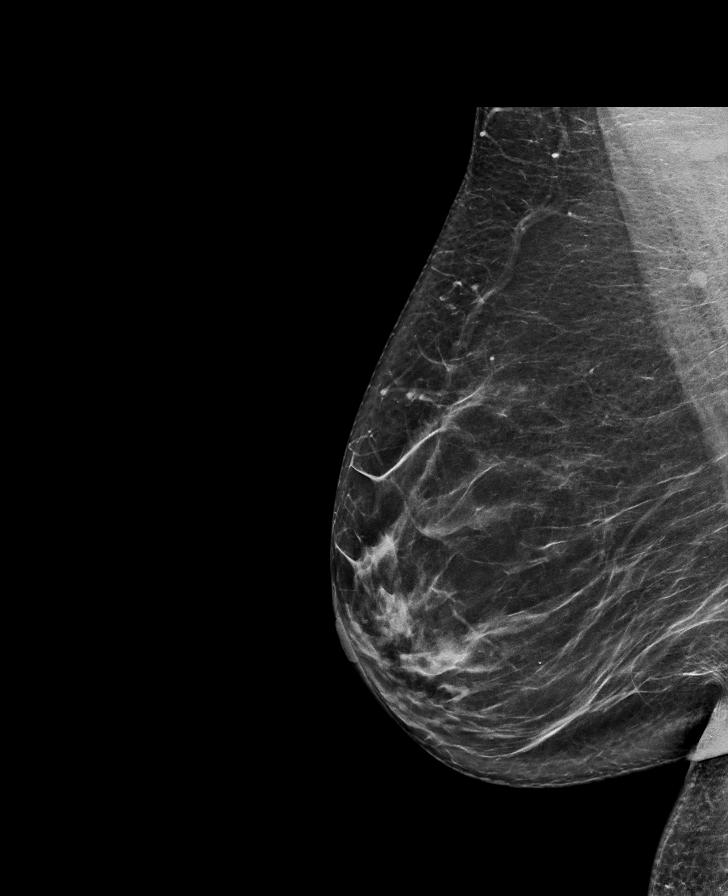

[L MLO synth-2D]
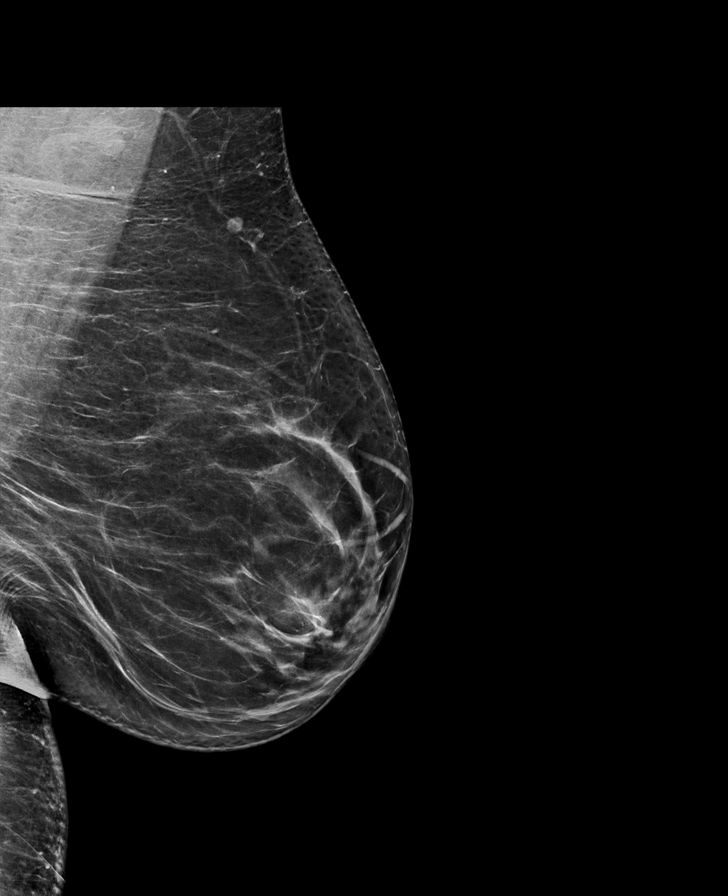

[R MLO tomo · tomo slice 41/80.0]
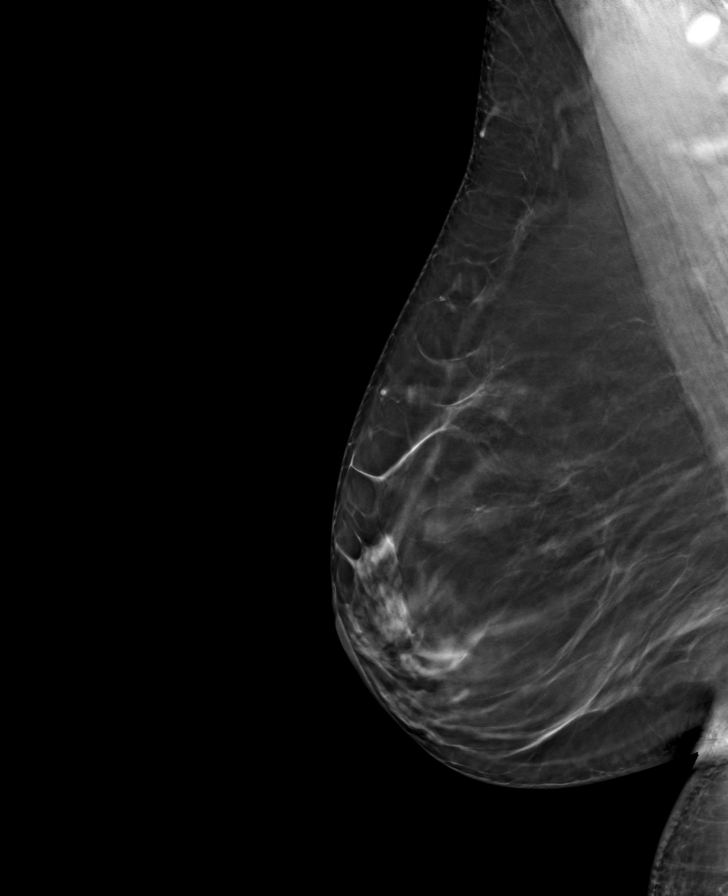

[L MLO tomo · tomo slice 43/84.0]
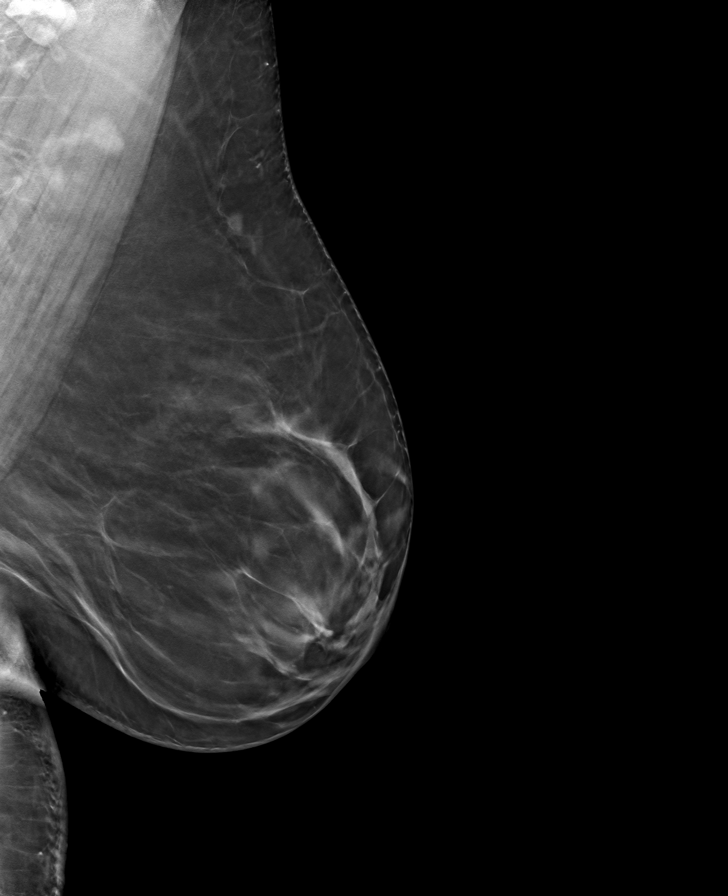

[R CC tomo · tomo slice 38/75.0]
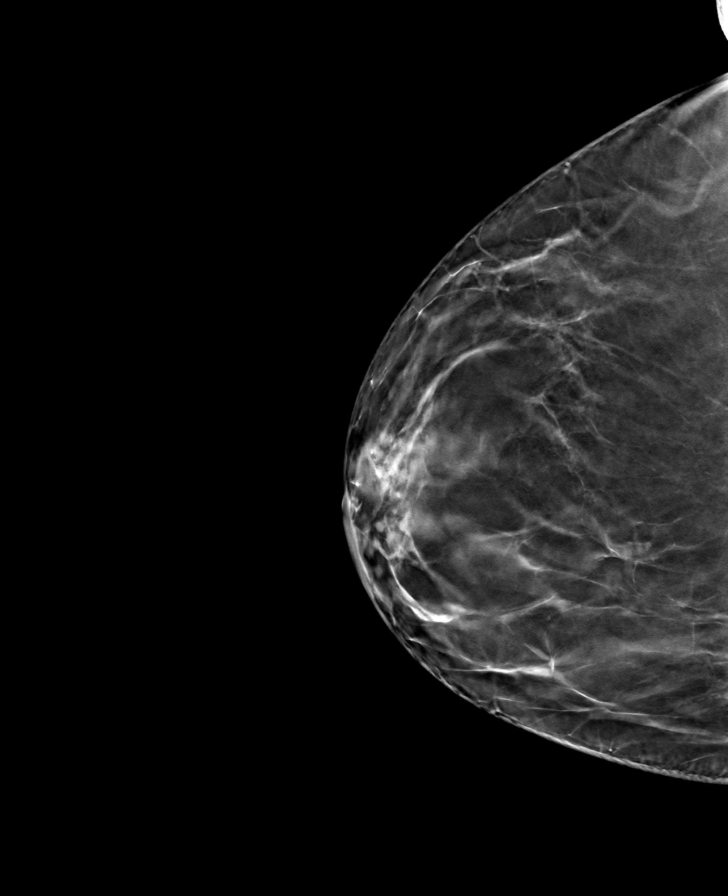

[L CC tomo · tomo slice 37/72.0]
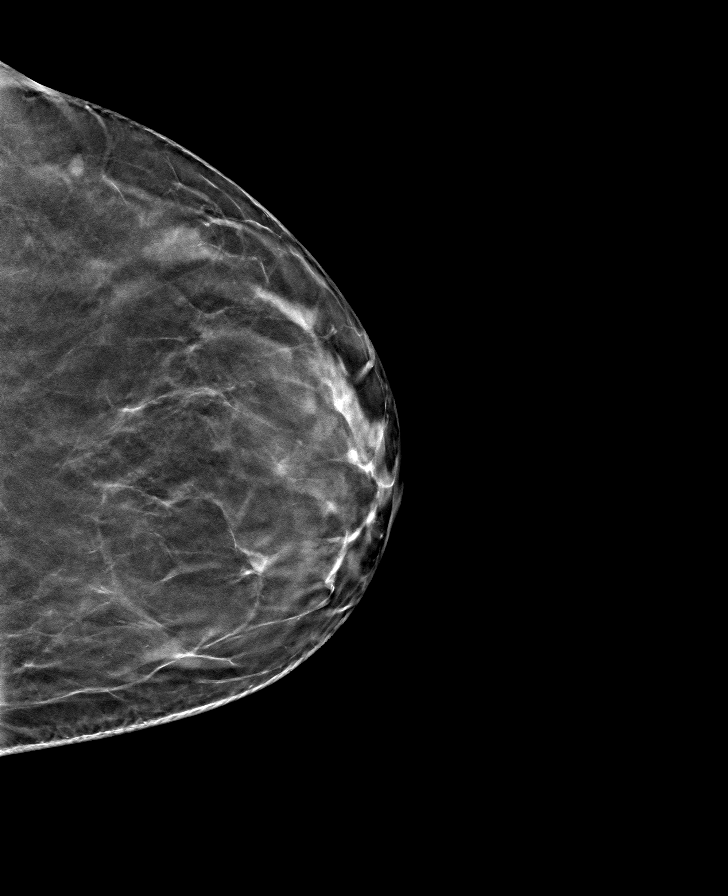

[8 of 24 positions shown; findings below may reference images not displayed]

ACR Breast Density Category b: There are scattered areas of
fibroglandular density.
FINDINGS: There are no findings suspicious for malignancy.
IMPRESSION: No mammographic evidence of malignancy. A result letter of this
screening mammogram will be mailed directly to the patient.

RECOMMENDATION:
Screening mammogram in one year. (Code:51-O-LD2)

BI-RADS CATEGORY  1: Negative.

## 2023-07-07 ENCOUNTER — Telehealth (HOSPITAL_BASED_OUTPATIENT_CLINIC_OR_DEPARTMENT_OTHER): Payer: Self-pay | Admitting: Family

## 2023-07-07 NOTE — Telephone Encounter (Signed)
Attempted to contact patient, no answer, VM requires remote access code, will reattempt contact.    Will ask nursing team to call  Ensure taking Atorvastatin 40mg  daily  Discuss increase in A1c and cholesterol levels  Recommend aiming for 150 minutes of moderate intensity activity per week and following a heart healthy diet.    Can either make lifestyle changes and recheck lipid panel in 2 months or schedule appt with pharmacy team to discuss lipid goals further.    Alver Sorrow, NP

## 2023-07-07 NOTE — Telephone Encounter (Signed)
Labs received in [employer] while covering. They were reviewed by Dr. Herbie Baltimore, as follows:  "Labs from 02/08/2023 Na+ 141, K+ 4.7, Cl- 109, HCO3-21, BUN 14, Cr 0.97, Glu 120, Ca2+ 8.8; AST 29, ALT 23, AlkP 141 TC 142, TG 84, HDL 55, LDL 71; A1c 7.7   => From 1/61/0960: LFTs ALT 1.9, AST 131, alk phos 159; TC 118, TG 77, HDL 49, LDL 53; A1c 6.6.   Lipid function improved, but A1c went up as did LDL.!   I have not seen her in quite a while.  Would need to get the story behind the change in LFTs, A1c and lipids.   Most recently seen by Edd Fabian, NP   Bryan Lemma, MD"  Seen 05/20/23 by Bernadene Person, NP doing well from cardiac perspective. Given hx of MI, HTN, DM2 LDL goal <55.   Will ask nursing team to call Ensure taking Atorvastatin 40mg  daily Discuss increase in A1c and cholesterol levels Recommend aiming for 150 minutes of moderate intensity activity per week and following a heart healthy diet.   Can either make lifestyle changes and recheck lipid panel in 2 months or schedule appt with pharmacy team to discuss lipid goals further.   Alver Sorrow, NP

## 2023-07-08 NOTE — Telephone Encounter (Signed)
2nd call attempt, no answer, VM requires access code, will reattempt contact.      Will ask nursing team to call Ensure taking Atorvastatin 40mg  daily Discuss increase in A1c and cholesterol levels            Recommend aiming for 150 minutes of moderate intensity activity per week and following a heart healthy diet.              Can either make lifestyle changes and recheck lipid panel in 2 months or schedule appt with pharmacy team to discuss lipid goals further.    Alver Sorrow, NP

## 2023-07-11 ENCOUNTER — Encounter (HOSPITAL_BASED_OUTPATIENT_CLINIC_OR_DEPARTMENT_OTHER): Payer: Self-pay

## 2023-07-11 NOTE — Telephone Encounter (Signed)
3rd call attempt, no answer, VM requires access code, letter mailed to patient with request to call office to discuss the following results.    Will ask nursing team to call Ensure taking Atorvastatin 40mg  daily Discuss increase in A1c and cholesterol levels            Recommend aiming for 150 minutes of moderate intensity activity per week and following a heart healthy diet.              Can either make lifestyle changes and recheck lipid panel in 2 months or schedule appt with pharmacy team to discuss lipid goals further.    Alver Sorrow, NP

## 2023-07-15 ENCOUNTER — Telehealth: Payer: Self-pay | Admitting: Cardiology

## 2023-07-15 ENCOUNTER — Other Ambulatory Visit: Payer: Self-pay | Admitting: General Practice

## 2023-07-15 MED ORDER — ATORVASTATIN CALCIUM 40 MG PO TABS: 40.0000 mg | ORAL_TABLET | Freq: Every day | ORAL | 3 refills | Status: DC

## 2023-07-15 NOTE — Telephone Encounter (Signed)
Call back to patient . She states calling about a letter. Was regarding Atorvastatin. See note from 7/29  This was to clarify if patient taking Atorvastatin 40 mg Daily Advised instruction of heart healthy diet and exercise .  She zstates she had labs drawn at PCP last week and values were normal.  Advised to have her PCP send them to our office.  Based on those labs, we will see if any changes in recommendations.  She states undertanding

## 2023-07-15 NOTE — Telephone Encounter (Signed)
Patient is calling because she received the letter in the mail that we sent her. Patient is requesting for the nurse to give her a call back. Patient stated that her mobile phone number is [phone]. Patient stated if she is at work and does not answer lvm and she will call back as soon as she's available. Please advise.

## 2023-07-15 NOTE — Telephone Encounter (Signed)
Patient is returning call. Requesting return call.  

## 2023-07-15 NOTE — Telephone Encounter (Signed)
Called pt back. Tried to LVM but the mailbox is full.

## 2023-07-15 NOTE — Telephone Encounter (Signed)
Pt's medication was sent to pt's pharmacy as requested. Confirmation received.  °

## 2023-07-15 NOTE — Telephone Encounter (Signed)
*  STAT* If patient is at the pharmacy, call can be transferred to refill team.   1. Which medications need to be refilled? (please list name of each medication and dose if known)   atorvastatin (LIPITOR) 40 MG tablet    2. Which pharmacy/location (including street and city if local pharmacy) is medication to be sent to?[id number] -  ([place]), [place] - [address]   3. Do they need a 30 day or 90 day supply? 90 Day Supply   Pt is currently out of the medication

## 2023-09-23 ENCOUNTER — Telehealth: Payer: Self-pay

## 2023-09-23 NOTE — Telephone Encounter (Signed)
   Pre-operative Risk Assessment    Patient Name: Rebecca Cuevas  DOB: 02/03/1961 MRN: [id number]     Request for Surgical Clearance    Procedure:  Scaling and root planing and extractions (Dental office closed so unable to verify how many, will call Monday)  Date of Surgery:  Clearance TBD                                 Surgeon:  not listed  Surgeon's Group or Practice [facility]  Phone number:  [phone] Fax number:  [phone]   Type of Clearance Requested:   - Medical    Type of Anesthesia:  not indicated    Additional requests/questions:    Rebecca Cuevas   09/23/2023, 5:22 PM

## 2023-09-28 NOTE — Telephone Encounter (Signed)
   Name: Rebecca Cuevas  DOB: 27-May-1961  MRN: [id number]  Primary Cardiologist: Bryan Lemma, MD  Chart reviewed as part of pre-operative protocol coverage. Because of [name] past medical history and time since last visit, she will require a follow-up telephone visit in order to better assess preoperative cardiovascular risk.  Pre-op covering staff: - Please schedule appointment and call patient to inform them. If patient already had an upcoming appointment within acceptable timeframe, please add "pre-op clearance" to the appointment notes so provider is aware. - Please contact requesting surgeon's office via preferred method (i.e, phone, fax) to inform them of need for appointment prior to surgery.  Will route recommendations to Dr. Herbie Baltimore for holding Brilinta since she has had multiple thrombolic stent events in the past.  Sharlene Dory, PA-C  09/28/2023, 12:57 PM

## 2023-09-28 NOTE — Telephone Encounter (Signed)
   [facility] calling to f/u clearance. She said, they will extracting 2 teeth and deep cleaning including under the gum line and may cause bleeding. They also need clearance regarding pt's blood thinner

## 2023-09-29 NOTE — Telephone Encounter (Signed)
I have not seen her in 3 years.  She has been followed up by APP's.  I think she was last seen in June.  Her PCI was in 2019.  My note from 2021 did address this concern-which is something I always do.  Unfortunately since I had not seen her since 2021, we did not comment on that.  It is fine to hold her Brilinta.  5 days preop should be fine..  Coronary artery disease involving native coronary artery of native heart with angina pectoris (HCC) - Primary (Chronic) Plan: Continue current medications but increase carvedilol to 12.5 mg twice daily. Continue maintenance dose of Brilinta along with high-dose statin. Okay to hold Brilinta 5-7 days preop for surgeries or procedures. As needed NTG.      Bryan Lemma, MD

## 2023-10-04 ENCOUNTER — Telehealth: Payer: Self-pay | Admitting: *Deleted

## 2023-10-04 NOTE — Telephone Encounter (Signed)
Pt has been scheduled for tele pre op appt 10/14/23. Med rec and consent are done.

## 2023-10-04 NOTE — Telephone Encounter (Signed)
Pt has been scheduled for tele pre op appt 10/14/23. Med rec and consent are done.     Patient Consent for Virtual Visit        Rebecca Cuevas has provided verbal consent on 10/04/2023 for a virtual visit (video or telephone).   CONSENT FOR VIRTUAL VISIT FOR:  Rebecca Cuevas  By participating in this virtual visit I agree to the following:  I hereby voluntarily request, consent and authorize [facility] and its employed or contracted physicians, physician assistants, nurse practitioners or other licensed health care professionals (the Practitioner), to provide me with telemedicine health care services (the "Services") as deemed necessary by the treating Practitioner. I acknowledge and consent to receive the Services by the Practitioner via telemedicine. I understand that the telemedicine visit will involve communicating with the Practitioner through live audiovisual communication technology and the disclosure of certain medical information by electronic transmission. I acknowledge that I have been given the opportunity to request an in-person assessment or other available alternative prior to the telemedicine visit and am voluntarily participating in the telemedicine visit.  I understand that I have the right to withhold or withdraw my consent to the use of telemedicine in the course of my care at any time, without affecting my right to future care or treatment, and that the Practitioner or I may terminate the telemedicine visit at any time. I understand that I have the right to inspect all information obtained and/or recorded in the course of the telemedicine visit and may receive copies of available information for a reasonable fee.  I understand that some of the potential risks of receiving the Services via telemedicine include:  Delay or interruption in medical evaluation due to technological equipment failure or disruption; Information transmitted may not be sufficient (e.g. poor  resolution of images) to allow for appropriate medical decision making by the Practitioner; and/or  In rare instances, security protocols could fail, causing a breach of personal health information.  Furthermore, I acknowledge that it is my responsibility to provide information about my medical history, conditions and care that is complete and accurate to the best of my ability. I acknowledge that Practitioner's advice, recommendations, and/or decision may be based on factors not within their control, such as incomplete or inaccurate data provided by me or distortions of diagnostic images or specimens that may result from electronic transmissions. I understand that the practice of medicine is not an exact science and that Practitioner makes no warranties or guarantees regarding treatment outcomes. I acknowledge that a copy of this consent can be made available to me via my patient portal [facility] MyChart), or I can request a printed copy by calling the office of [facility].    I understand that my insurance will be billed for this visit.   I have read or had this consent read to me. I understand the contents of this consent, which adequately explains the benefits and risks of the Services being provided via telemedicine.  I have been provided ample opportunity to ask questions regarding this consent and the Services and have had my questions answered to my satisfaction. I give my informed consent for the services to be provided through the use of telemedicine in my medical care

## 2023-10-04 NOTE — Telephone Encounter (Signed)
Pt calling to set up virtual tele visit

## 2023-10-14 ENCOUNTER — Ambulatory Visit: Payer: Managed Care, Other (non HMO) | Attending: Cardiovascular Disease | Admitting: Student

## 2023-10-14 DIAGNOSIS — Z01810 Encounter for preprocedural cardiovascular examination: Secondary | ICD-10-CM | POA: Diagnosis not present

## 2023-10-14 NOTE — Progress Notes (Signed)
Virtual Visit via Telephone Note   Because of Rebecca Cuevas co-morbid illnesses, she is at least at moderate risk for complications without adequate follow up.  This format is felt to be most appropriate for this patient at this time.  The patient did not have access to video technology/had technical difficulties with video requiring transitioning to audio format only (telephone).  All issues noted in this document were discussed and addressed.  No physical exam could be performed with this format.  Please refer to the patient's chart for her consent to telehealth for [facility].  Evaluation Performed:  Preoperative cardiovascular risk assessment _____________   Date:  10/14/2023   Patient ID:  Rebecca Cuevas, DOB 09-11-1961, MRN [id number] Patient Location:  Home Provider location:   Office  Primary Care Provider:  Kaleen Mask, MD Primary Cardiologist:  Rebecca Lemma, MD  Chief Complaint / Patient Profile   62 y.o. y/o female with a h/o CAD s/p PCI with DES to RCA August 2019, hypertension, hyperlipidemia, T2DM, tobacco abuse who is pending dental extraction of 2 teeth and scaling[facility]ing [facility] and presents today for telephonic preoperative cardiovascular risk assessment.  History of Present Illness    Rebecca Cuevas is a 62 y.o. female who presents via audio/video conferencing for a telehealth visit today.  Pt was last seen in cardiology clinic on 05/20/2023 by Bernadene Person, NP.  At that time Rebecca Cuevas was stable from a cardiac standpoint.  The patient is now pending procedure as outlined above. Since her last visit, she is doing well. Patient denies shortness of breath, dyspnea on exertion, lower extremity edema, orthopnea or PND. No chest pain, pressure, or tightness. No palpitations. 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 She does a lot of walking, pushing, pulling and lifting throughout the  day.   Past Medical History    Past Medical History:  Diagnosis Date   CAD 08/09/2018   s/p thrombectomy and DES to proximal RCA   History of NSTEMI 08/09/2018   99% thrombotic pRCA --> thrombectomy & DES PCI (overnight aggrastat for distal embolization to RPAV) -  Lv Gram EF 50-55% with Inferior HK -> f/u Echo with Hyperdynamic LV, EF 65-70% & no RWMA.   Hyperlipidemia    Hypertension    Tobacco abuse    Type 2 diabetes mellitus (HCC)    Past Surgical History:  Procedure Laterality Date   ABDOMINAL HYSTERECTOMY     CESAREAN SECTION     CORONARY STENT INTERVENTION N/A 08/09/2018   Procedure: CORONARY STENT INTERVENTION;  Surgeon: Iran Ouch, MD;  Location: [facility];  Service: Cardiovascular;  prox RCA 99% (thrombotic) stenosis (DES PCI with distal emobolization to rPAV; Synergy DES 3.5 x 20), -> rPAV 80% (distal embolization)   HERNIA REPAIR     LEFT HEART CATH AND CORONARY ANGIOGRAPHY N/A 08/09/2018   Procedure: LEFT HEART CATH AND CORONARY ANGIOGRAPHY;  Surgeon: Iran Ouch, MD;  Location: [facility];  prox RCA 99% (thrombotic) stenosis (DES PCI with distal emobolization to rPAV; Synergy DES), -> rPAV 80% (distal embolization). p-mCx 30%. p-m LAD 40%. EF 50-55% w/ Inferior HK.   NM MYOVIEW LTD  07/2020   EF 55 to 60%.  No EKG changes.  No ischemia or infarction.  LOW RISK.   TRANSTHORACIC ECHOCARDIOGRAM  08/10/2018   post Inf NSTEMI --> Mild concentric LVH with vigorous function.  EF 65-70%.  No RWMA.  GR 1 DD.    Allergies  Allergies  Allergen Reactions   Contrast Media [Iodinated Contrast Media] Hives, Shortness Of Breath and Itching    Pt broke out in large hives on her face after CT scan with contrast injection. Pt became slightly more short of breath than she was already after scan as well. Pt needs pre-meds prior to future studies.     Home Medications    Prior to Admission medications   Medication Sig Start Date End Date Taking?  Authorizing Provider  albuterol (VENTOLIN HFA) 108 (90 Base) MCG/ACT inhaler Inhale into the lungs every 6 (six) hours as needed for wheezing or shortness of breath. Patient not taking: Reported on 05/20/2023    [provider]  atorvastatin (LIPITOR) 40 MG tablet Take 1 tablet (40 mg total) by mouth daily. 07/15/23   Rebecca Lex, MD  betamethasone dipropionate 0.05 % cream Apply topically 2 (two) times daily. Patient not taking: Reported on 05/20/2023 04/23/20   Felecia Shelling, DPM  Blood Glucose Monitoring Suppl (BAYER CONTOUR NEXT MONITOR) w/Device KIT 1 Device by Does not apply route 2 (two) times daily. Patient not taking: Reported on 05/20/2023 02/24/17   Reva Bores, MD  carvedilol (COREG) 25 MG tablet TAKE 1 TABLET BY MOUTH TWICE DAILY 05/20/23   Joylene Grapes, NP  cyclobenzaprine (FLEXERIL) 10 MG tablet Take 1 tablet (10 mg total) by mouth 2 (two) times daily as needed for muscle spasms. Patient not taking: Reported on 05/20/2023 01/30/23   Valinda Hoar, NP  gentamicin cream (GARAMYCIN) 0.1 % Apply 1 application topically 2 (two) times daily. Patient not taking: Reported on 05/20/2023 05/20/21   Felecia Shelling, DPM  glucose blood (BAYER CONTOUR NEXT TEST) test strip Use as instructed Patient not taking: Reported on 10/04/2023 02/24/17   Reva Bores, MD  hydrocortisone cream 1 % APPLY ONE APPLICATION TOPICALLY ONCE DAILY AS NEEDED FOR ITCHING (FOR ECZEMA). Patient not taking: Reported on 05/20/2023 05/19/16   Reva Bores, MD  Lancet Devices (MICROLET NEXT LANCING DEVICE) MISC 1 Device by Does not apply route 2 (two) times daily. 02/24/17   Reva Bores, MD  lisinopril (ZESTRIL) 10 MG tablet Take 1 tablet (10 mg total) by mouth daily. 05/20/23   Joylene Grapes, NP  loratadine-pseudoephedrine (CLARITIN-D 12-HOUR) 5-120 MG tablet Take 1 tablet by mouth 2 (two) times daily. Patient not taking: Reported on 05/20/2023    [provider]  metFORMIN (GLUCOPHAGE) 500 MG tablet TAKE 1  TABLET BY MOUTH TWICE DAILY WITH MEALS Patient not taking: Reported on 05/20/2023 09/28/19   Reva Bores, MD  Multiple Vitamins-Minerals (COMPLETE MULTIVITAMIN/MINERAL PO) Take by mouth. Patient not taking: Reported on 05/20/2023    [provider]  naproxen sodium (ANAPROX DS) 550 MG tablet Take 1 tablet (550 mg total) by mouth 2 (two) times daily with a meal. Patient not taking: Reported on 05/20/2023 01/30/23   Valinda Hoar, NP  nitroGLYCERIN (NITROSTAT) 0.4 MG SL tablet PLACE 1 TABLET UNDER THE TONGUE EVERY 5 MINUTES FOR 3 DOSES AS NEEDED FOR CHEST PAIN Patient not taking: Reported on 05/20/2023 03/10/20   Rebecca Lex, MD  omeprazole (PRILOSEC) 20 MG capsule Take 20 mg by mouth daily.    [provider]  ticagrelor (BRILINTA) 60 MG TABS tablet TAKE 1 TABLET BY MOUTH TWICE DAILY. 05/20/23   Joylene Grapes, NP  TRULICITY 3 MG/0.5ML SOPN SMARTSIG:3 Milligram(s) SUB-Q Once a Week Patient  not taking: Reported on 05/20/2023 05/31/22   [provider]  vitamin B-12 (CYANOCOBALAMIN) 500 MCG tablet Take 500 mcg by mouth daily.    [provider]  vitamin C (ASCORBIC ACID) 500 MG tablet Take 500 mg by mouth daily. Patient not taking: Reported on 05/20/2023    [provider]    Physical Exam    Vital Signs:  Simmie Garin does not have vital signs available for review today.  Given telephonic nature of communication, physical exam is limited. AAOx3. NAD. Normal affect.  Speech and respirations are unlabored.  Accessory Clinical Findings    None  Assessment & Plan    Primary Cardiologist: Rebecca Lemma, MD  Preoperative cardiovascular risk assessment.  Dental extractions of 2 teeth and [facility].  Chart reviewed as part of pre-operative protocol coverage. According to the RCRI, patient has a 0.9% risk of MACE. Patient reports activity equivalent to >4.0 METS (works full time as a Lawyer in a nursing home).   Given  past medical history and time since last visit, based on ACC/AHA guidelines, Alyshia Kernan would be at acceptable risk for the planned procedure without further cardiovascular testing.   Patient was advised that if she develops new symptoms prior to surgery to contact our office to arrange a follow-up appointment.  she verbalized understanding.  Per Dr. Herbie Baltimore, he may hold Brilinta for 5-7 days prior to procedure and should resume as soon as hemodynamically stable postoperatively.   I will route this recommendation to the requesting party via Epic fax function.  Please call with questions.  Time:   Today, I have spent 7 minutes with the patient with telehealth technology discussing medical history, symptoms, and management plan.     Carlos Levering, NP  10/14/2023, 8:24 AM

## 2023-10-20 ENCOUNTER — Other Ambulatory Visit: Payer: Self-pay

## 2023-10-20 ENCOUNTER — Ambulatory Visit
Admission: EM | Admit: 2023-10-20 | Discharge: 2023-10-20 | Disposition: A | Discharge status: Home / Self Care | Payer: Managed Care, Other (non HMO)

## 2023-10-20 ENCOUNTER — Emergency Department (HOSPITAL_BASED_OUTPATIENT_CLINIC_OR_DEPARTMENT_OTHER): Payer: Managed Care, Other (non HMO) | Admitting: Radiology

## 2023-10-20 ENCOUNTER — Emergency Department (HOSPITAL_BASED_OUTPATIENT_CLINIC_OR_DEPARTMENT_OTHER): Payer: Managed Care, Other (non HMO)

## 2023-10-20 ENCOUNTER — Emergency Department (HOSPITAL_BASED_OUTPATIENT_CLINIC_OR_DEPARTMENT_OTHER)
Admission: EM | Admit: 2023-10-20 | Discharge: 2023-10-20 | Disposition: A | Discharge status: Home / Self Care | Payer: Managed Care, Other (non HMO) | Attending: Emergency Medicine | Admitting: Emergency Medicine

## 2023-10-20 ENCOUNTER — Encounter (HOSPITAL_BASED_OUTPATIENT_CLINIC_OR_DEPARTMENT_OTHER): Payer: Self-pay | Admitting: Emergency Medicine

## 2023-10-20 DIAGNOSIS — I10 Essential (primary) hypertension: Secondary | ICD-10-CM | POA: Diagnosis not present

## 2023-10-20 DIAGNOSIS — L03119 Cellulitis of unspecified part of limb: Secondary | ICD-10-CM

## 2023-10-20 DIAGNOSIS — Z79899 Other long term (current) drug therapy: Secondary | ICD-10-CM | POA: Diagnosis not present

## 2023-10-20 DIAGNOSIS — L02519 Cutaneous abscess of unspecified hand: Secondary | ICD-10-CM

## 2023-10-20 DIAGNOSIS — Z7984 Long term (current) use of oral hypoglycemic drugs: Secondary | ICD-10-CM | POA: Diagnosis not present

## 2023-10-20 DIAGNOSIS — I2510 Atherosclerotic heart disease of native coronary artery without angina pectoris: Secondary | ICD-10-CM | POA: Diagnosis not present

## 2023-10-20 DIAGNOSIS — L02511 Cutaneous abscess of right hand: Secondary | ICD-10-CM | POA: Insufficient documentation

## 2023-10-20 DIAGNOSIS — E119 Type 2 diabetes mellitus without complications: Secondary | ICD-10-CM | POA: Diagnosis not present

## 2023-10-20 DIAGNOSIS — L0291 Cutaneous abscess, unspecified: Secondary | ICD-10-CM

## 2023-10-20 DIAGNOSIS — F17210 Nicotine dependence, cigarettes, uncomplicated: Secondary | ICD-10-CM | POA: Diagnosis not present

## 2023-10-20 DIAGNOSIS — R1011 Right upper quadrant pain: Secondary | ICD-10-CM | POA: Insufficient documentation

## 2023-10-20 DIAGNOSIS — R7401 Elevation of levels of liver transaminase levels: Secondary | ICD-10-CM | POA: Insufficient documentation

## 2023-10-20 DIAGNOSIS — M7989 Other specified soft tissue disorders: Secondary | ICD-10-CM | POA: Diagnosis present

## 2023-10-20 DIAGNOSIS — R748 Abnormal levels of other serum enzymes: Secondary | ICD-10-CM

## 2023-10-20 LAB — CBC WITH DIFFERENTIAL/PLATELET
Abs Immature Granulocytes: 0.03 10*3/uL (ref 0.00–0.07)
Basophils Absolute: 0 10*3/uL (ref 0.0–0.1)
Basophils Relative: 0 %
Eosinophils Absolute: 0.2 10*3/uL (ref 0.0–0.5)
Eosinophils Relative: 2 %
HCT: 36.6 % (ref 36.0–46.0)
Hemoglobin: 12 g/dL (ref 12.0–15.0)
Immature Granulocytes: 0 %
Lymphocytes Relative: 20 %
Lymphs Abs: 1.8 10*3/uL (ref 0.7–4.0)
MCH: 28 pg (ref 26.0–34.0)
MCHC: 32.8 g/dL (ref 30.0–36.0)
MCV: 85.3 fL (ref 80.0–100.0)
Monocytes Absolute: 0.9 10*3/uL (ref 0.1–1.0)
Monocytes Relative: 10 %
Neutro Abs: 6.1 10*3/uL (ref 1.7–7.7)
Neutrophils Relative %: 68 %
Platelets: 162 10*3/uL (ref 150–400)
RBC: 4.29 MIL/uL (ref 3.87–5.11)
RDW: 16.3 % — ABNORMAL HIGH (ref 11.5–15.5)
WBC: 9.1 10*3/uL (ref 4.0–10.5)
nRBC: 0 % (ref 0.0–0.2)

## 2023-10-20 LAB — COMPREHENSIVE METABOLIC PANEL
ALT: 207 U/L — ABNORMAL HIGH (ref 0–44)
AST: 217 U/L — ABNORMAL HIGH (ref 15–41)
Albumin: 3.1 g/dL — ABNORMAL LOW (ref 3.5–5.0)
Alkaline Phosphatase: 226 U/L — ABNORMAL HIGH (ref 38–126)
Anion gap: 5 (ref 5–15)
BUN: 8 mg/dL (ref 8–23)
CO2: 25 mmol/L (ref 22–32)
Calcium: 9 mg/dL (ref 8.9–10.3)
Chloride: 108 mmol/L (ref 98–111)
Creatinine, Ser: 0.88 mg/dL (ref 0.44–1.00)
GFR, Estimated: >60 mL/min (ref >60)
Glucose, Bld: 184 mg/dL — ABNORMAL HIGH (ref 70–99)
Potassium: 3.9 mmol/L (ref 3.5–5.1)
Sodium: 138 mmol/L (ref 135–145)
Total Bilirubin: 1.2 mg/dL — ABNORMAL HIGH (ref <1.2)
Total Protein: 7.5 g/dL (ref 6.5–8.1)

## 2023-10-20 LAB — URINALYSIS, W/ REFLEX TO CULTURE (INFECTION SUSPECTED)
Bacteria, UA: NONE SEEN
Bilirubin Urine: NEGATIVE
Glucose, UA: NEGATIVE mg/dL
Hgb urine dipstick: NEGATIVE
Ketones, ur: NEGATIVE mg/dL
Leukocytes,Ua: NEGATIVE
Nitrite: NEGATIVE
Protein, ur: NEGATIVE mg/dL
Specific Gravity, Urine: 1.016 (ref 1.005–1.030)
pH: 5.5 (ref 5.0–8.0)

## 2023-10-20 LAB — LACTIC ACID, PLASMA: Lactic Acid, Venous: 1.3 mmol/L (ref 0.5–1.9)

## 2023-10-20 MED ORDER — SODIUM CHLORIDE 0.9 % IV SOLN
1.0000 g | Freq: Once | INTRAVENOUS | Status: AC
  Administered 2023-10-20: 1 g via INTRAVENOUS
  Filled 2023-10-20: qty 10

## 2023-10-20 MED ORDER — DOXYCYCLINE HYCLATE 100 MG PO CAPS: 100.0000 mg | ORAL_CAPSULE | Freq: Two times a day (BID) | ORAL | 0 refills | Status: AC

## 2023-10-20 MED ORDER — DOXYCYCLINE HYCLATE 100 MG PO TABS
100.0000 mg | ORAL_TABLET | Freq: Once | ORAL | Status: AC
  Administered 2023-10-20: 100 mg via ORAL
  Filled 2023-10-20: qty 1

## 2023-10-20 MED ORDER — LIDOCAINE-EPINEPHRINE (PF) 2 %-1:200000 IJ SOLN
10.0000 mL | Freq: Once | INTRAMUSCULAR | Status: AC
  Administered 2023-10-20: 10 mL
  Filled 2023-10-20: qty 20

## 2023-10-20 NOTE — ED Triage Notes (Signed)
Pt c/o RT hand swelling x 1 week, referred by UC for abscess. Pt endorses drainage noted.

## 2023-10-20 NOTE — Discharge Instructions (Signed)
Go to the emergency department for further evaluation and management

## 2023-10-20 NOTE — ED Provider Notes (Signed)
[facility]    CSN: [id number] Arrival date & time: 10/20/23  0935      History   Chief Complaint Chief Complaint  Patient presents with   Hand Problem    HPI Rebecca Cuevas is a 62 y.o. female.   Patient presents today with concern of right hand pain and swelling that started yesterday.  Reports that she has history of eczema and is concerned that it could now be infected.  Denies any associated fever.  Reports it has been draining.     Past Medical History:  Diagnosis Date   CAD 08/09/2018   s/p thrombectomy and DES to proximal RCA   History of NSTEMI 08/09/2018   99% thrombotic pRCA --> thrombectomy & DES PCI (overnight aggrastat for distal embolization to RPAV) -  Lv Gram EF 50-55% with Inferior HK -> f/u Echo with Hyperdynamic LV, EF 65-70% & no RWMA.   Hyperlipidemia    Hypertension    Tobacco abuse    Type 2 diabetes mellitus [facility])     Patient Active Problem List   Diagnosis Date Noted   Precordial pain 07/21/2020   Presence of drug coated stent in right coronary artery 08/24/2019   Hyperlipidemia 08/24/2019   CAD S/P percutaneous coronary angioplasty 08/09/2018   Callus of foot 02/04/2017   Type 2 diabetes mellitus (HCC) 11/22/2016   Thoracic back pain 11/22/2016   Arthritis 08/23/2011   Obesity, unspecified 04/29/2009   Hypertension 04/29/2009   Tobacco abuse 02/09/2007    Past Surgical History:  Procedure Laterality Date   ABDOMINAL HYSTERECTOMY     CESAREAN SECTION     CORONARY STENT INTERVENTION N/A 08/09/2018   Procedure: CORONARY STENT INTERVENTION;  Surgeon: Iran Ouch, MD;  Location: [facility];  Service: Cardiovascular;  prox RCA 99% (thrombotic) stenosis (DES PCI with distal emobolization to rPAV; Synergy DES 3.5 x 20), -> rPAV 80% (distal embolization)   HERNIA REPAIR     LEFT HEART CATH AND CORONARY ANGIOGRAPHY N/A 08/09/2018   Procedure: LEFT HEART CATH AND CORONARY ANGIOGRAPHY;  Surgeon: Iran Ouch,  MD;  Location: [facility];  prox RCA 99% (thrombotic) stenosis (DES PCI with distal emobolization to rPAV; Synergy DES), -> rPAV 80% (distal embolization). p-mCx 30%. p-m LAD 40%. EF 50-55% w/ Inferior HK.   NM MYOVIEW LTD  07/2020   EF 55 to 60%.  No EKG changes.  No ischemia or infarction.  LOW RISK.   TRANSTHORACIC ECHOCARDIOGRAM  08/10/2018   post Inf NSTEMI --> Mild concentric LVH with vigorous function.  EF 65-70%.  No RWMA.  GR 1 DD.    OB History   No obstetric history on file.      Home Medications    Prior to Admission medications   Medication Sig Start Date End Date Taking? Authorizing Provider  albuterol (VENTOLIN HFA) 108 (90 Base) MCG/ACT inhaler Inhale into the lungs every 6 (six) hours as needed for wheezing or shortness of breath. Patient not taking: Reported on 05/20/2023    [provider]  atorvastatin (LIPITOR) 40 MG tablet Take 1 tablet (40 mg total) by mouth daily. 07/15/23   Marykay Lex, MD  betamethasone dipropionate 0.05 % cream Apply topically 2 (two) times daily. Patient not taking: Reported on 05/20/2023 04/23/20   Felecia Shelling, DPM  Blood Glucose Monitoring Suppl (BAYER CONTOUR NEXT MONITOR) w/Device KIT 1 Device by Does not apply route 2 (two) times daily. Patient not taking: Reported on 05/20/2023 02/24/17  Reva Bores, MD  carvedilol (COREG) 25 MG tablet TAKE 1 TABLET BY MOUTH TWICE DAILY 05/20/23   Joylene Grapes, NP  cyclobenzaprine (FLEXERIL) 10 MG tablet Take 1 tablet (10 mg total) by mouth 2 (two) times daily as needed for muscle spasms. Patient not taking: Reported on 05/20/2023 01/30/23   Valinda Hoar, NP  gentamicin cream (GARAMYCIN) 0.1 % Apply 1 application topically 2 (two) times daily. Patient not taking: Reported on 05/20/2023 05/20/21   Felecia Shelling, DPM  glucose blood (BAYER CONTOUR NEXT TEST) test strip Use as instructed Patient not taking: Reported on 10/04/2023 02/24/17   Reva Bores, MD  hydrocortisone cream 1 %  APPLY ONE APPLICATION TOPICALLY ONCE DAILY AS NEEDED FOR ITCHING (FOR ECZEMA). Patient not taking: Reported on 05/20/2023 05/19/16   Reva Bores, MD  Lancet Devices (MICROLET NEXT LANCING DEVICE) MISC 1 Device by Does not apply route 2 (two) times daily. 02/24/17   Reva Bores, MD  lisinopril (ZESTRIL) 10 MG tablet Take 1 tablet (10 mg total) by mouth daily. 05/20/23   Joylene Grapes, NP  loratadine-pseudoephedrine (CLARITIN-D 12-HOUR) 5-120 MG tablet Take 1 tablet by mouth 2 (two) times daily. Patient not taking: Reported on 05/20/2023    [provider]  metFORMIN (GLUCOPHAGE) 500 MG tablet TAKE 1 TABLET BY MOUTH TWICE DAILY WITH MEALS Patient not taking: Reported on 05/20/2023 09/28/19   Reva Bores, MD  Multiple Vitamins-Minerals (COMPLETE MULTIVITAMIN/MINERAL PO) Take by mouth. Patient not taking: Reported on 05/20/2023    [provider]  naproxen sodium (ANAPROX DS) 550 MG tablet Take 1 tablet (550 mg total) by mouth 2 (two) times daily with a meal. Patient not taking: Reported on 05/20/2023 01/30/23   Valinda Hoar, NP  nitroGLYCERIN (NITROSTAT) 0.4 MG SL tablet PLACE 1 TABLET UNDER THE TONGUE EVERY 5 MINUTES FOR 3 DOSES AS NEEDED FOR CHEST PAIN Patient not taking: Reported on 05/20/2023 03/10/20   Marykay Lex, MD  omeprazole (PRILOSEC) 20 MG capsule Take 20 mg by mouth daily.    [provider]  ticagrelor (BRILINTA) 60 MG TABS tablet TAKE 1 TABLET BY MOUTH TWICE DAILY. 05/20/23   Joylene Grapes, NP  TRULICITY 3 MG/0.5ML SOPN SMARTSIG:3 Milligram(s) SUB-Q Once a Week Patient not taking: Reported on 05/20/2023 05/31/22   [provider]  vitamin B-12 (CYANOCOBALAMIN) 500 MCG tablet Take 500 mcg by mouth daily.    [provider]  vitamin C (ASCORBIC ACID) 500 MG tablet Take 500 mg by mouth daily. Patient not taking: Reported on 05/20/2023    [provider]    Family History Family History  Problem Relation Age of Onset   Hypertension  Mother    Diabetes Father    Colon cancer Neg Hx     Social History Social History   Tobacco Use   Smoking status: Every Day    Current packs/day: 0.50    Types: Cigarettes   Smokeless tobacco: Never   Tobacco comments:    8 cigs a day  Vaping Use   Vaping status: Never Used  Substance Use Topics   Alcohol use: Yes    Alcohol/week: 0.0 standard drinks of alcohol    Comment: occasional   Drug use: No     Allergies   Contrast media [iodinated contrast media]   Review of Systems Review of Systems Per HPI  Physical Exam Triage Vital Signs ED Triage Vitals  Encounter Vitals Group     BP  10/20/23 1046 123/86     Systolic BP Percentile --      Diastolic BP Percentile --      Pulse Rate 10/20/23 1046 75     Resp 10/20/23 1046 16     Temp 10/20/23 1046 98.4 F (36.9 C)     Temp Source 10/20/23 1046 Oral     SpO2 10/20/23 1046 98 %     Weight --      Height --      Head Circumference --      Peak Flow --      Pain Score 10/20/23 1047 10     Pain Loc --      Pain Education --      Exclude from Growth Chart --    No data found.  Updated Vital Signs BP 123/86 (BP Location: Left Arm)   Pulse 75   Temp 98.4 F (36.9 C) (Oral)   Resp 16   SpO2 98%   Visual Acuity Right Eye Distance:   Left Eye Distance:   Bilateral Distance:    Right Eye Near:   Left Eye Near:    Bilateral Near:     Physical Exam Constitutional:      General: She is not in acute distress.    Appearance: Normal appearance. She is not toxic-appearing or diaphoretic.  HENT:     Head: Normocephalic and atraumatic.  Eyes:     Extraocular Movements: Extraocular movements intact.     Conjunctiva/sclera: Conjunctivae normal.  Pulmonary:     Effort: Pulmonary effort is normal.  Skin:    Comments: Patient has abscess like lesion that is approximately 2 cm in diameter present to dorsal surface of right hand overlying the fourth and fifth metacarpals directly below wrist.  Mild drainage  noted.  Swelling and erythema spreads throughout the dorsal surface of the hand and this seems to streak up the wrist slightly at the radial portion.  Full range of motion of fingers present.  Capillary refill and pulses intact.  Neurological:     General: No focal deficit present.     Mental Status: She is alert and oriented to person, place, and time. Mental status is at baseline.  Psychiatric:        Mood and Affect: Mood normal.        Behavior: Behavior normal.        Thought Content: Thought content normal.        Judgment: Judgment normal.      UC Treatments / Results  Labs (all labs ordered are listed, but only abnormal results are displayed) Labs Reviewed - No data to display  EKG   Radiology No results found.  Procedures Procedures (including critical care time)  Medications Ordered in UC Medications - No data to display  Initial Impression / Assessment and Plan / UC Course  I have reviewed the triage vital signs and the nursing notes.  Pertinent labs & imaging results that were available during my care of the patient were reviewed by me and considered in my medical decision making (see chart for details).     Patient appears to have infection/cellulitis of the right hand.  Given amount of swelling and streaking, I am concerned for osteomyelitis.  Therefore, patient was advised to go to the emergency department for further evaluation and management.  She was agreeable with plan.  Vital signs stable at discharge.  Agree with patient self transport to the ER. Final Clinical Impressions(s) / UC Diagnoses  Final diagnoses:  Cellulitis and abscess of hand     Discharge Instructions      Go to the emergency department for further evaluation and management.    ED Prescriptions   None    PDMP not reviewed this encounter.   Gustavus Bryant, [place] 10/20/23 1113

## 2023-10-20 NOTE — Discharge Instructions (Addendum)
As discussed, your liver enzymes were elevated.  Not sure exactly why this is the case given that your ultrasound of your liver and gallbladder was normal.  Recommend not taking Tylenol and following up with your primary care provider for reevaluation of your liver enzymes.  If you develop fever, abdominal pain especially in the right upper abdomen, please return to be seen more urgently but otherwise, recommend scheduling appointment for repeat blood work.  The abscess was drained today.  Will place you on antibiotics for treatment of infection.  Wash area gently with warm soapy water as well as perform daily bandage changes.  If you develop worsening redness, swelling, fever, pain, please return to be seen more quickly.

## 2023-10-20 NOTE — ED Triage Notes (Signed)
Pt states right hand pain,swelling, and drainage.  States she has an open sore on her hand that has been draining.  Sore noted to top of right hand with serous drainage.

## 2023-10-20 NOTE — ED Provider Notes (Signed)
[place] EMERGENCY DEPARTMENT AT [facility] Provider Note   CSN: [id number] Arrival date & time: 10/20/23  1222     History  Chief Complaint  Patient presents with   Abscess    Rebecca Cuevas is a 62 y.o. female.   Abscess   62 year old female presents to the emergency department with complaints of right-sided hand swelling/pain/drainage.  Reports history of eczema and initially noticed rash appeared on the back of her right hand.  States that over the past 4 to 5 days, area became red, swollen and began to drain over the past couple of days.  Was seen at urgent care and told to come to the emergency department for further assessment.  Patient has not been on antibiotics.  Describes drainage as yellow clear fluid with some blood mixed in.  Denies any fever, weakness/sensory deficits in hand.  Past medical history significant for CAD, hyperlipidemia, hypertension, diabetes mellitus type 2, NSTEMI  Home Medications Prior to Admission medications   Medication Sig Start Date End Date Taking? Authorizing Provider  doxycycline (VIBRAMYCIN) 100 MG capsule Take 1 capsule (100 mg total) by mouth 2 (two) times daily. 10/20/23  Yes Sherian Maroon A, PA  albuterol (VENTOLIN HFA) 108 (90 Base) MCG/ACT inhaler Inhale into the lungs every 6 (six) hours as needed for wheezing or shortness of breath. Patient not taking: Reported on 05/20/2023    [provider]  atorvastatin (LIPITOR) 40 MG tablet Take 1 tablet (40 mg total) by mouth daily. 07/15/23   Marykay Lex, MD  betamethasone dipropionate 0.05 % cream Apply topically 2 (two) times daily. Patient not taking: Reported on 05/20/2023 04/23/20   Felecia Shelling, DPM  Blood Glucose Monitoring Suppl (BAYER CONTOUR NEXT MONITOR) w/Device KIT 1 Device by Does not apply route 2 (two) times daily. Patient not taking: Reported on 05/20/2023 02/24/17   Reva Bores, MD  carvedilol (COREG) 25 MG tablet TAKE 1 TABLET BY MOUTH TWICE DAILY  05/20/23   Joylene Grapes, NP  cyclobenzaprine (FLEXERIL) 10 MG tablet Take 1 tablet (10 mg total) by mouth 2 (two) times daily as needed for muscle spasms. Patient not taking: Reported on 05/20/2023 01/30/23   Valinda Hoar, NP  gentamicin cream (GARAMYCIN) 0.1 % Apply 1 application topically 2 (two) times daily. Patient not taking: Reported on 05/20/2023 05/20/21   Felecia Shelling, DPM  glucose blood (BAYER CONTOUR NEXT TEST) test strip Use as instructed Patient not taking: Reported on 10/04/2023 02/24/17   Reva Bores, MD  hydrocortisone cream 1 % APPLY ONE APPLICATION TOPICALLY ONCE DAILY AS NEEDED FOR ITCHING (FOR ECZEMA). Patient not taking: Reported on 05/20/2023 05/19/16   Reva Bores, MD  Lancet Devices (MICROLET NEXT LANCING DEVICE) MISC 1 Device by Does not apply route 2 (two) times daily. 02/24/17   Reva Bores, MD  lisinopril (ZESTRIL) 10 MG tablet Take 1 tablet (10 mg total) by mouth daily. 05/20/23   Joylene Grapes, NP  loratadine-pseudoephedrine (CLARITIN-D 12-HOUR) 5-120 MG tablet Take 1 tablet by mouth 2 (two) times daily. Patient not taking: Reported on 05/20/2023    [provider]  metFORMIN (GLUCOPHAGE) 500 MG tablet TAKE 1 TABLET BY MOUTH TWICE DAILY WITH MEALS Patient not taking: Reported on 05/20/2023 09/28/19   Reva Bores, MD  Multiple Vitamins-Minerals (COMPLETE MULTIVITAMIN/MINERAL PO) Take by mouth. Patient not taking: Reported on 05/20/2023    [provider]  naproxen sodium (ANAPROX DS) 550 MG tablet Take 1 tablet (550  mg total) by mouth 2 (two) times daily with a meal. Patient not taking: Reported on 05/20/2023 01/30/23   Valinda Hoar, NP  nitroGLYCERIN (NITROSTAT) 0.4 MG SL tablet PLACE 1 TABLET UNDER THE TONGUE EVERY 5 MINUTES FOR 3 DOSES AS NEEDED FOR CHEST PAIN Patient not taking: Reported on 05/20/2023 03/10/20   Marykay Lex, MD  omeprazole (PRILOSEC) 20 MG capsule Take 20 mg by mouth daily.    [provider]  ticagrelor (BRILINTA)  60 MG TABS tablet TAKE 1 TABLET BY MOUTH TWICE DAILY. 05/20/23   Joylene Grapes, NP  TRULICITY 3 MG/0.5ML SOPN SMARTSIG:3 Milligram(s) SUB-Q Once a Week Patient not taking: Reported on 05/20/2023 05/31/22   [provider]  vitamin B-12 (CYANOCOBALAMIN) 500 MCG tablet Take 500 mcg by mouth daily.    [provider]  vitamin C (ASCORBIC ACID) 500 MG tablet Take 500 mg by mouth daily. Patient not taking: Reported on 05/20/2023    [provider]      Allergies    Contrast media [iodinated contrast media]    Review of Systems   Review of Systems  All other systems reviewed and are negative.   Physical Exam Updated Vital Signs BP (!) 146/86   Pulse 82   Temp 98.1 F (36.7 C) (Oral)   Resp 18   Wt 92.1 kg   SpO2 100%   BMI 32.77 kg/m  Physical Exam Vitals and nursing note reviewed.  Constitutional:      General: She is not in acute distress.    Appearance: She is well-developed.  HENT:     Head: Normocephalic and atraumatic.  Eyes:     Conjunctiva/sclera: Conjunctivae normal.  Cardiovascular:     Rate and Rhythm: Normal rate and regular rhythm.     Heart sounds: No murmur heard. Pulmonary:     Effort: Pulmonary effort is normal. No respiratory distress.     Breath sounds: Normal breath sounds. No wheezing or rales.  Abdominal:     General: Bowel sounds are normal. There is no distension.     Palpations: Abdomen is soft.     Tenderness: There is no abdominal tenderness. There is no guarding.  Musculoskeletal:        General: No swelling.     Cervical back: Neck supple.     Comments: Patient with area of palpable fluctuance on the dorsal lateral aspect of right wrist.  Area measures 2.5 to 3 cm in diameter.  Some surrounding erythema.  Swelling localized to the back of right hand.  Mild amounts of yellow serous/bloody drainage appreciated from area of fluctuance.  Area tender to the touch and palpably warm.  Skin:    General: Skin is warm and dry.      Capillary Refill: Capillary refill takes less than 2 seconds.  Neurological:     Mental Status: She is alert.  Psychiatric:        Mood and Affect: Mood normal.     ED Results / Procedures / Treatments   Labs (all labs ordered are listed, but only abnormal results are displayed) Labs Reviewed  COMPREHENSIVE METABOLIC PANEL - Abnormal; Notable for the following components:      Result Value   Glucose, Bld 184 (*)    Albumin 3.1 (*)    AST 217 (*)    ALT 207 (*)    Alkaline Phosphatase 226 (*)    Total Bilirubin 1.2 (*)    All other components within normal limits  CBC WITH DIFFERENTIAL/PLATELET - Abnormal; Notable for the following components:   RDW 16.3 (*)    All other components within normal limits  LACTIC ACID, PLASMA  URINALYSIS, W/ REFLEX TO CULTURE (INFECTION SUSPECTED)    EKG None  Radiology DG Hand Complete Right  Result Date: 10/20/2023 CLINICAL DATA:  Pain.  Right hand swelling for 1 week. EXAM: RIGHT HAND - COMPLETE 3+ VIEW COMPARISON:  None Available. FINDINGS: There is prominent soft tissue swelling along the ulnar aspect of the hand adjacent to the fifth metacarpal as well as along the dorsum of the hand. No radiopaque foreign body, underlying osseous erosion, acute fracture, or dislocation is identified. There is advanced first CMC osteoarthrosis with bulky spurring. IMPRESSION: 1. Soft tissue swelling without acute osseous abnormality. 2. Advanced first CMC osteoarthrosis. Electronically Signed   By: Sebastian Ache M.D.   On: 10/20/2023 17:00   US Abdomen Limited RUQ (LIVER/GB)  Result Date: 10/20/2023 CLINICAL DATA:  Elevation liver enzymes. EXAM: ULTRASOUND ABDOMEN LIMITED RIGHT UPPER QUADRANT COMPARISON:  Ultrasound 12/02/2016 FINDINGS: Gallbladder: Gallbladder is underdistended. No wall thickening or adjacent fluid. No shadowing stones. Common bile duct: Diameter: 4 mm Liver: No focal lesion identified. Within normal limits in parenchymal echogenicity. Portal  vein is patent on color Doppler imaging with normal direction of blood flow towards the liver. Other: None. IMPRESSION: Underdistended gallbladder. No obvious stones. No ductal dilatation. Electronically Signed   By: Karen Kays M.D.   On: 10/20/2023 15:16    Procedures .Marland KitchenIncision and Drainage  Date/Time: 10/20/2023 3:56 PM  Performed by: Peter Garter, PA Authorized by: Peter Garter, PA   Consent:    Consent obtained:  Verbal   Consent given by:  Patient   Risks, benefits, and alternatives were discussed: yes     Risks discussed:  Bleeding, damage to other organs, incomplete drainage, infection and pain   Alternatives discussed:  No treatment and delayed treatment Universal protocol:    Procedure explained and questions answered to patient or proxy's satisfaction: yes     Patient identity confirmed:  Verbally with patient Location:    Type:  Abscess   Size:  2.6   Location:  Upper extremity   Upper extremity location:  Hand   Hand location:  R hand Pre-procedure details:    Skin preparation:  Povidone-iodine Sedation:    Sedation type:  None Anesthesia:    Anesthesia method:  Local infiltration   Local anesthetic:  Lidocaine 2% WITH epi Procedure type:    Complexity:  Simple Procedure details:    Ultrasound guidance: no     Needle aspiration: no     Incision types:  Single straight   Incision depth:  Dermal   Wound management:  Probed and deloculated and irrigated with saline   Drainage:  Bloody and purulent   Drainage amount:  Moderate   Wound treatment:  Wound left open   Packing materials:  None Post-procedure details:    Procedure completion:  Tolerated well, no immediate complications     Medications Ordered in ED Medications  lidocaine-EPINEPHrine (XYLOCAINE W/EPI) 2 %-1:200000 (PF) injection 10 mL (10 mLs Infiltration Given 10/20/23 1528)  doxycycline (VIBRA-TABS) tablet 100 mg (100 mg Oral Given 10/20/23 1528)  cefTRIAXone (ROCEPHIN) 1 g in sodium  chloride 0.9 % 100 mL IVPB (0 g Intravenous Stopped 10/20/23 1615)    ED Course/ Medical Decision Making/ A&P  Medical Decision Making Amount and/or Complexity of Data Reviewed Labs: ordered. Radiology: ordered.  Risk Prescription drug management.   This patient presents to the ED for concern of hand pain, this involves an extensive number of treatment options, and is a complaint that carries with it a high risk of complications and morbidity.  The differential diagnosis includes cellulitis, abscess, erysipelas, necrotizing infection, sepsis, other   Co morbidities that complicate the patient evaluation  See HPI   Additional history obtained:  Additional history obtained from EMR External records from outside source obtained and reviewed including hospital records   Lab Tests:  I Ordered, and personally interpreted labs.  The pertinent results include: No leukocytosis.  No evidence of anemia.  Platelets within range.  UA without abnormality.  Lactic acid normal.  No electrolyte abnormalities.  No renal dysfunction.  Patient with elevated AST, ALT, alk phos and total bilirubin up to 17, 207, 226 and 1.2 respectively.  Patient without any abdominal pain.   Imaging Studies ordered:  I ordered imaging studies including x-ray right hand, right upper quadrant ultrasound I independently visualized and interpreted imaging which showed  Right upper quadrant ultrasound: Under distended gallbladder.  No obvious sludge/stone/dilation. X-ray right hand: Soft tissue swelling.  Osteoarthritis I agree with the radiologist interpretation   Cardiac Monitoring: / EKG:  The patient was maintained on a cardiac monitor.  I personally viewed and interpreted the cardiac monitored which showed an underlying rhythm of: Sinus rhythm   Consultations Obtained:  N/a   Problem List / ED Course / Critical interventions / Medication management  Abscess, elevated  liver enzymes I ordered medication including Rocephin, doxycycline, lidocaine with epinephrine   Reevaluation of the patient after these medicines showed that the patient improved I have reviewed the patients home medicines and have made adjustments as needed   Social Determinants of Health:  Chronic cigarette use.  Denies illicit drug use.   Test / Admission - Considered:  Abscess, elevated liver enzymes Vitals signs significant for hypertension blood pressure 151/87. Otherwise within normal range and stable throughout visit. Laboratory/imaging studies significant for: See above 62 year old female presents emergency department with complaints of abscess.  Patient symptoms been going on for the past 4 to 5 days and initially began as an eczematous flareup and then developed cellulitis and subsequently abscess.  On exam, patient with area of palpable fluctuance as above.  Labs performed by triage staff without evidence of lactic acidosis, leukocytosis.  Abscess drained in manner as above.  Will place patient on empiric antibiotics given surrounding cellulitic skin changes.  Patient without meeting of SIRS criteria so sepsis protocol was not followed.  Labs did show elevation of liver enzymes, alkaline phosphatase.  Patient with abdominal pain, excessive Tylenol/alcohol intake, abdominal pain, nausea/vomiting.  Given lab findings, right upper quadrant ultrasound was obtained which was negative for any acute pathology.  Will recommend follow-up with primary care for reassessment of laboratory studies with strict return precautions assessments if development of concerning accompanying symptoms.  Treatment plan discussed at length with patient and she acknowledged understanding was agreeable to said plan.  Patient overall well-appearing, afebrile in no acute distress. Worrisome signs and symptoms were discussed with the patient, and the patient acknowledged understanding to return to the ED if noticed.  Patient was stable upon discharge.          Final Clinical Impression(s) / ED Diagnoses Final diagnoses:  Abscess  Elevated liver enzymes    Rx / DC Orders ED Discharge Orders  Ordered    doxycycline (VIBRAMYCIN) 100 MG capsule  2 times daily        10/20/23 1608              Peter Garter, [place] 10/20/23 1738    Laurence Spates, MD 10/20/23 2018

## 2023-10-20 NOTE — ED Notes (Signed)
Patient is being discharged from the Urgent Care and sent to the Emergency Department via private vehicle . Per [place] NP , patient is in need of higher level of care due to infected hand. Patient is aware and verbalizes understanding of plan of care.  Vitals:   10/20/23 1046  BP: 123/86  Pulse: 75  Resp: 16  Temp: 98.4 F (36.9 C)  SpO2: 98%

## 2023-10-24 ENCOUNTER — Encounter: Payer: Self-pay | Admitting: Gastroenterology

## 2023-11-23 NOTE — Progress Notes (Signed)
Labs from PCP dated 06/27/2023 Na+ 142, K+ 4.1, Cl- 108, HCO3-23, BUN 11, Cr 0.86, Glu 82, Ca2+ 8.9; AST 29, ALT 18, AlkP 138 CBC: W 0.9, H/H 13.1/41.4, Plt 163 TC 136, TG 77, HDL 61, LDL 60; A1c 6.7; TSH 1.09  Bryan Lemma, MD

## 2023-12-12 NOTE — Progress Notes (Signed)
 [facility] - Clinic Note  12/20/2023   CHIEF COMPLAINT Patient presents for Retina Evaluation  HISTORY OF PRESENT ILLNESS: Rebecca Cuevas is a 62 y.o. female who presents to the clinic today for:  HPI     Retina Evaluation   In right eye.  Associated Symptoms Floaters.  Negative for Flashes.  I, the attending physician,  performed the HPI with the patient and updated documentation appropriately.        Comments   Patient is here today based on a referral from [facility] for lattice/holes OD. She is stating that the right eye keeps jumping. And she needs to take her glasses off to read. She does not use eye drops. She does not check her blood sugar daily.       Last edited by Valdemar Rogue, MD on 12/20/2023 10:19 PM.     Patient states the eyes are jumpy.   Referring physician: Clem Brands, PA-C  [facility] [address] [place],  [place] [address]  HISTORICAL INFORMATION:  Selected notes from the MEDICAL RECORD NUMBER Referred by Clem Brands, PA-C for retina eval, possible lattice LEE:  Ocular Hx- PMH-   CURRENT MEDICATIONS: No current outpatient medications on file. (Ophthalmic Drugs)   No current facility-administered medications for this visit. (Ophthalmic Drugs)   Current Outpatient Medications (Other)  Medication Sig   albuterol (VENTOLIN HFA) 108 (90 Base) MCG/ACT inhaler Inhale into the lungs every 6 (six) hours as needed for wheezing or shortness of breath.   atorvastatin  (LIPITOR ) 40 MG tablet Take 1 tablet (40 mg total) by mouth daily.   betamethasone  dipropionate 0.05 % cream Apply topically 2 (two) times daily.   Blood Glucose Monitoring Suppl (BAYER CONTOUR NEXT MONITOR) w/Device KIT 1 Device by Does not apply route 2 (two) times daily.   carvedilol  (COREG ) 25 MG tablet TAKE 1 TABLET BY MOUTH TWICE DAILY   cyclobenzaprine  (FLEXERIL ) 10 MG tablet Take 1 tablet (10 mg total) by mouth 2 (two) times daily as  needed for muscle spasms.   doxycycline  (VIBRAMYCIN ) 100 MG capsule Take 1 capsule (100 mg total) by mouth 2 (two) times daily.   gentamicin  cream (GARAMYCIN ) 0.1 % Apply 1 application topically 2 (two) times daily.   glucose blood (BAYER CONTOUR NEXT TEST) test strip Use as instructed   hydrocortisone  cream 1 % APPLY ONE APPLICATION TOPICALLY ONCE DAILY AS NEEDED FOR ITCHING (FOR ECZEMA).   Lancet Devices (MICROLET NEXT LANCING DEVICE) MISC 1 Device by Does not apply route 2 (two) times daily.   lisinopril  (ZESTRIL ) 10 MG tablet Take 1 tablet (10 mg total) by mouth daily.   loratadine-pseudoephedrine (CLARITIN-D 12-HOUR) 5-120 MG tablet Take 1 tablet by mouth 2 (two) times daily.   metFORMIN  (GLUCOPHAGE ) 500 MG tablet TAKE 1 TABLET BY MOUTH TWICE DAILY WITH MEALS   Multiple Vitamins-Minerals (COMPLETE MULTIVITAMIN/MINERAL PO) Take by mouth.   naproxen  sodium (ANAPROX  DS) 550 MG tablet Take 1 tablet (550 mg total) by mouth 2 (two) times daily with a meal.   nitroGLYCERIN  (NITROSTAT ) 0.4 MG SL tablet PLACE 1 TABLET UNDER THE TONGUE EVERY 5 MINUTES FOR 3 DOSES AS NEEDED FOR CHEST PAIN   omeprazole (PRILOSEC) 20 MG capsule Take 20 mg by mouth daily.   ticagrelor  (BRILINTA ) 60 MG TABS tablet TAKE 1 TABLET BY MOUTH TWICE DAILY.   TRULICITY 3 MG/0.5ML SOPN    vitamin B-12 (CYANOCOBALAMIN) 500 MCG tablet Take 500 mcg by mouth daily.   vitamin  C (ASCORBIC ACID) 500 MG tablet Take 500 mg by mouth daily.   No current facility-administered medications for this visit. (Other)   REVIEW OF SYSTEMS: ROS   Positive for: Musculoskeletal, Endocrine, Eyes Last edited by Myra Wanda SAILOR, COT on 12/20/2023  1:27 PM.     ALLERGIES Allergies  Allergen Reactions   Contrast Media [Iodinated Contrast Media] Hives, Shortness Of Breath and Itching    Pt broke out in large hives on her face after CT scan with contrast injection. Pt became slightly more short of breath than she was already after scan as well. Pt  needs pre-meds prior to future studies.    PAST MEDICAL HISTORY Past Medical History:  Diagnosis Date   CAD 08/09/2018   s/p thrombectomy and DES to proximal RCA   History of NSTEMI 08/09/2018   99% thrombotic pRCA --> thrombectomy & DES PCI (overnight aggrastat  for distal embolization to RPAV) -  Lv Gram EF 50-55% with Inferior HK -> f/u Echo with Hyperdynamic LV, EF 65-70% & no RWMA.   Hyperlipidemia    Hypertension    Tobacco abuse    Type 2 diabetes mellitus (HCC)    Past Surgical History:  Procedure Laterality Date   ABDOMINAL HYSTERECTOMY     CESAREAN SECTION     CORONARY STENT INTERVENTION N/A 08/09/2018   Procedure: CORONARY STENT INTERVENTION;  Surgeon: Darron Deatrice LABOR, MD;  Location: [facility];  Service: Cardiovascular;  prox RCA 99% (thrombotic) stenosis (DES PCI with distal emobolization to rPAV; Synergy DES 3.5 x 20), -> rPAV 80% (distal embolization)   HERNIA REPAIR     LEFT HEART CATH AND CORONARY ANGIOGRAPHY N/A 08/09/2018   Procedure: LEFT HEART CATH AND CORONARY ANGIOGRAPHY;  Surgeon: Darron Deatrice LABOR, MD;  Location: [facility];  prox RCA 99% (thrombotic) stenosis (DES PCI with distal emobolization to rPAV; Synergy DES), -> rPAV 80% (distal embolization). p-mCx 30%. p-m LAD 40%. EF 50-55% w/ Inferior HK.   NM MYOVIEW  LTD  07/2020   EF 55 to 60%.  No EKG changes.  No ischemia or infarction.  LOW RISK.   TRANSTHORACIC ECHOCARDIOGRAM  08/10/2018   post Inf NSTEMI --> Mild concentric LVH with vigorous function.  EF 65-70%.  No RWMA.  GR 1 DD.   FAMILY HISTORY Family History  Problem Relation Age of Onset   Hypertension Mother    Diabetes Father    Colon cancer Neg Hx    SOCIAL HISTORY Social History   Tobacco Use   Smoking status: Every Day    Current packs/day: 0.50    Types: Cigarettes   Smokeless tobacco: Never   Tobacco comments:    8 cigs a day  Vaping Use   Vaping status: Never Used  Substance Use Topics   Alcohol use: Yes     Alcohol/week: 0.0 standard drinks of alcohol    Comment: occasional   Drug use: No       OPHTHALMIC EXAM:  Base Eye Exam     Visual Acuity (Snellen - Linear)       Right Left   Dist cc 20/25 20/30   Dist ph cc NI NI    Correction: Glasses         Tonometry (Tonopen, 1:33 PM)       Right Left   Pressure 17 19         Pupils       Dark Light Shape React APD   Right 3 2 Round Brisk  None   Left 3 2 Round Brisk None         Visual Fields       Left Right    Full Full         Extraocular Movement       Right Left    Full, Ortho Full, Ortho         Neuro/Psych     Oriented x3: Yes         Dilation     Both eyes: 1.0% Mydriacyl, 2.5% Phenylephrine @ 1:29 PM           Slit Lamp and Fundus Exam     External Exam       Right Left   External Normal Normal         Slit Lamp Exam       Right Left   Lids/Lashes Dermatochalasis - upper lid Dermatochalasis - upper lid   Conjunctiva/Sclera Melanosis Melanosis   Cornea Fine Endothelium Pigment Clear   Anterior Chamber Deep and clear Deep and clear   Iris Round and dilated, No NVI Round and dilated, No NVI   Lens 1-2+ Nuclear sclerosis, 2+ Cortical cataract 2+ Nuclear sclerosis, 2+ Cortical cataract   Anterior Vitreous Vitreous syneresis Vitreous syneresis         Fundus Exam       Right Left   Disc Pink and sharp, With cupping, Mild Tilt Pink and sharp, With cupping, Mild Tilt   C/D Ratio 0.7 0.8   Macula Flat, Blunted foveal reflex, RPE mottling and clumping, No heme or edema Flat, Blunted foveal reflex, RPE mottling and clumping, No heme or edema   Vessels Vascular attenuation, Tortuous Vascular attenuation, Tortuous   Periphery Attached, pigmented paving stone degeneration, oral bay cysts inferior and temporal quadrants, No RT/RD/Lattice Attached, pigmented paving stone degeneration, oral bay cysts inferior and temporal quadrants, No RT/RD/Lattice           Refraction      Wearing Rx       Sphere Cylinder Axis Add   Right -8.25 +1.50 100 +2.00   Left -8.00 Sphere 180 +2.00           IMAGING AND PROCEDURES  Imaging and Procedures for 12/20/2023  OCT, Retina - OU - Both Eyes       Right Eye Quality was good. Central Foveal Thickness: 277. Progression has no prior data. Findings include normal foveal contour, no IRF, no SRF, myopic contour, vitreomacular adhesion (No DME).   Left Eye Quality was good. Central Foveal Thickness: 277. Progression has no prior data. Findings include normal foveal contour, no IRF, no SRF, myopic contour (Partial PVD, No DME).   Notes *Images captured and stored on drive  Diagnosis / Impression:  NFP; no IRF/SRF OU No DME OU  Clinical management:  See below  Abbreviations: NFP - Normal foveal profile. CME - cystoid macular edema. PED - pigment epithelial detachment. IRF - intraretinal fluid. SRF - subretinal fluid. EZ - ellipsoid zone. ERM - epiretinal membrane. ORA - outer retinal atrophy. ORT - outer retinal tubulation. SRHM - subretinal hyper-reflective material. IRHM - intraretinal hyper-reflective material           ASSESSMENT/PLAN:   ICD-10-CM   1. Paving stone degeneration of peripheral retina, bilateral  H35.433     2. 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 Diabetes mellitus type 2 without retinopathy (HCC)  E11.9 OCT, Retina - OU - Both  Eyes    4. Diabetes mellitus treated with injections of non-insulin  medication (HCC)  E11.9    Z79.85     5. Essential hypertension  I10     6. Hypertensive retinopathy of both eyes  H35.033 OCT, Retina - OU - Both Eyes    7. Cortical age-related cataract of both eyes  H25.013      1,2. Paving stone degeneration and oral bay cysts OU  - no RT/RD/Lattice seen on scleral depressed exam OU  - BCVA 20/25 OU and pt asymptomatic -- no flashes of light or floaters  - pigmented paving stone and oral bay cysts in inferior and temporal quadrants OU  -  discussed findings, prognosis  - no retinal or ophthalmic interventions indicated or recommended at this time -- monitor  - f/u 4 months, DFE, OCT  3,4. Diabetes mellitus, type 2 without retinopathy  - A1C 6.7 (07.17.24) - The incidence, risk factors for progression, natural history and treatment options for diabetic retinopathy  were discussed with patient.   - The need for close monitoring of blood glucose, blood pressure, and serum lipids, avoiding cigarette or any type of tobacco, and the need for long term follow up was also discussed with patient.  - BCVA 20/25 OD, 20/30 OS  - OCT shows no DME OU  5,6. Hypertensive retinopathy OU - discussed importance of tight BP control - monitor   6. Mixed Cataract OU (OS>OD) - The symptoms of cataract, surgical options, and treatments and risks were discussed with patient. - discussed diagnosis and progression - monitor   Ophthalmic Meds Ordered this visit:  No orders of the defined types were placed in this encounter.    Return in about 4 months (around 04/18/2024) for f/u Retinal cyst OU, DFE, OCT.  There are no Patient Instructions on file for this visit.  Explained the diagnoses, plan, and follow up with the patient and they expressed understanding.  Patient expressed understanding of the importance of proper follow up care.   This document serves as a record of services personally performed by Redell JUDITHANN Hans, MD, PhD. It was created on their behalf by Wanda GEANNIE Keens, COT an ophthalmic technician. The creation of this record is the provider's dictation and/or activities during the visit.    Electronically signed by:  Wanda GEANNIE Keens, COT  12/20/23 10:20 PM  Redell JUDITHANN Hans, M.D., Ph.D. Diseases & Surgery of the Retina and Vitreous [facility] 12/20/2023  I have reviewed the above documentation for accuracy and completeness, and I agree with the above. Redell JUDITHANN Hans, M.D., Ph.D. 12/20/23 10:25  PM   Abbreviations: M myopia (nearsighted); A astigmatism; H hyperopia (farsighted); P presbyopia; Mrx spectacle prescription;  CTL contact lenses; OD right eye; OS left eye; OU both eyes  XT exotropia; ET esotropia; PEK punctate epithelial keratitis; PEE punctate epithelial erosions; DES dry eye syndrome; MGD meibomian gland dysfunction; ATs artificial tears; PFAT's preservative free artificial tears; NSC nuclear sclerotic cataract; PSC posterior subcapsular cataract; ERM epi-retinal membrane; PVD posterior vitreous detachment; RD retinal detachment; DM diabetes mellitus; DR diabetic retinopathy; NPDR non-proliferative diabetic retinopathy; PDR proliferative diabetic retinopathy; CSME clinically significant macular edema; DME diabetic macular edema; dbh dot blot hemorrhages; CWS cotton wool spot; POAG primary open angle glaucoma; C/D cup-to-disc ratio; HVF humphrey visual field; GVF goldmann visual field; OCT optical coherence tomography; IOP intraocular pressure; BRVO Branch retinal vein occlusion; CRVO central retinal vein occlusion; CRAO central retinal artery occlusion; BRAO branch retinal artery occlusion;  RT retinal tear; SB scleral buckle; PPV pars plana vitrectomy; VH Vitreous hemorrhage; PRP panretinal laser photocoagulation; IVK intravitreal kenalog ; VMT vitreomacular traction; MH Macular hole;  NVD neovascularization of the disc; NVE neovascularization elsewhere; AREDS age related eye disease study; ARMD age related macular degeneration; POAG primary open angle glaucoma; EBMD epithelial/anterior basement membrane dystrophy; ACIOL anterior chamber intraocular lens; IOL intraocular lens; PCIOL posterior chamber intraocular lens; Phaco/IOL phacoemulsification with intraocular lens placement; PRK photorefractive keratectomy; LASIK laser assisted in situ keratomileusis; HTN hypertension; DM diabetes mellitus; COPD chronic obstructive pulmonary disease

## 2023-12-20 ENCOUNTER — Encounter (INDEPENDENT_AMBULATORY_CARE_PROVIDER_SITE_OTHER): Payer: Self-pay | Admitting: Ophthalmology

## 2023-12-20 ENCOUNTER — Ambulatory Visit (INDEPENDENT_AMBULATORY_CARE_PROVIDER_SITE_OTHER): Payer: Self-pay | Admitting: Ophthalmology

## 2023-12-20 DIAGNOSIS — H33113 Cyst of ora serrata, bilateral: Secondary | ICD-10-CM

## 2023-12-20 DIAGNOSIS — H3581 Retinal edema: Secondary | ICD-10-CM

## 2023-12-20 DIAGNOSIS — I10 Essential (primary) hypertension: Secondary | ICD-10-CM | POA: Diagnosis not present

## 2023-12-20 DIAGNOSIS — E119 Type 2 diabetes mellitus without complications: Secondary | ICD-10-CM | POA: Diagnosis not present

## 2023-12-20 DIAGNOSIS — H25013 Cortical age-related cataract, bilateral: Secondary | ICD-10-CM

## 2023-12-20 DIAGNOSIS — Z7985 Long-term (current) use of injectable non-insulin antidiabetic drugs: Secondary | ICD-10-CM

## 2023-12-20 DIAGNOSIS — H35433 Paving stone degeneration of retina, bilateral: Secondary | ICD-10-CM

## 2023-12-20 DIAGNOSIS — H35033 Hypertensive retinopathy, bilateral: Secondary | ICD-10-CM

## 2024-01-04 ENCOUNTER — Other Ambulatory Visit: Payer: Self-pay | Admitting: Nurse Practitioner

## 2024-04-16 ENCOUNTER — Encounter (INDEPENDENT_AMBULATORY_CARE_PROVIDER_SITE_OTHER): Payer: Self-pay

## 2024-04-16 ENCOUNTER — Encounter (INDEPENDENT_AMBULATORY_CARE_PROVIDER_SITE_OTHER): Payer: Managed Care, Other (non HMO) | Admitting: Ophthalmology

## 2024-04-16 DIAGNOSIS — Z7985 Long-term (current) use of injectable non-insulin antidiabetic drugs: Secondary | ICD-10-CM

## 2024-04-16 DIAGNOSIS — H33113 Cyst of ora serrata, bilateral: Secondary | ICD-10-CM

## 2024-04-16 DIAGNOSIS — E119 Type 2 diabetes mellitus without complications: Secondary | ICD-10-CM

## 2024-04-16 DIAGNOSIS — H35033 Hypertensive retinopathy, bilateral: Secondary | ICD-10-CM

## 2024-04-16 DIAGNOSIS — H35433 Paving stone degeneration of retina, bilateral: Secondary | ICD-10-CM

## 2024-04-16 DIAGNOSIS — H25013 Cortical age-related cataract, bilateral: Secondary | ICD-10-CM

## 2024-05-02 NOTE — Progress Notes (Shared)
 [facility] - Clinic Note  05/04/2024   CHIEF COMPLAINT Patient presents for No chief complaint on file.  HISTORY OF PRESENT ILLNESS: Rebecca Cuevas is a 63 y.o. female who presents to the clinic today for:    Patient states the eyes are jumpy.   Referring physician: Franky Ivanoff, PA-C  [facility] [address] [place],  [place] [address]  HISTORICAL INFORMATION:  Selected notes from the MEDICAL RECORD NUMBER Referred by Franky Ivanoff, PA-C for retina eval, possible lattice LEE:  Ocular Hx- PMH-   CURRENT MEDICATIONS: No current outpatient medications on file. (Ophthalmic Drugs)   No current facility-administered medications for this visit. (Ophthalmic Drugs)   Current Outpatient Medications (Other)  Medication Sig   albuterol (VENTOLIN HFA) 108 (90 Base) MCG/ACT inhaler Inhale into the lungs every 6 (six) hours as needed for wheezing or shortness of breath.   atorvastatin  (LIPITOR ) 40 MG tablet Take 1 tablet (40 mg total) by mouth daily.   betamethasone  dipropionate 0.05 % cream Apply topically 2 (two) times daily.   Blood Glucose Monitoring Suppl (BAYER CONTOUR NEXT MONITOR) w/Device KIT 1 Device by Does not apply route 2 (two) times daily.   carvedilol  (COREG ) 25 MG tablet Take 1 tablet by mouth twice daily   cyclobenzaprine  (FLEXERIL ) 10 MG tablet Take 1 tablet (10 mg total) by mouth 2 (two) times daily as needed for muscle spasms.   doxycycline  (VIBRAMYCIN ) 100 MG capsule Take 1 capsule (100 mg total) by mouth 2 (two) times daily.   gentamicin  cream (GARAMYCIN ) 0.1 % Apply 1 application topically 2 (two) times daily.   glucose blood (BAYER CONTOUR NEXT TEST) test strip Use as instructed   hydrocortisone  cream 1 % APPLY ONE APPLICATION TOPICALLY ONCE DAILY AS NEEDED FOR ITCHING (FOR ECZEMA).   Lancet Devices (MICROLET NEXT LANCING DEVICE) MISC 1 Device by Does not apply route 2 (two) times daily.   lisinopril  (ZESTRIL )  10 MG tablet Take 1 tablet (10 mg total) by mouth daily.   loratadine-pseudoephedrine (CLARITIN-D 12-HOUR) 5-120 MG tablet Take 1 tablet by mouth 2 (two) times daily.   metFORMIN  (GLUCOPHAGE ) 500 MG tablet TAKE 1 TABLET BY MOUTH TWICE DAILY WITH MEALS   Multiple Vitamins-Minerals (COMPLETE MULTIVITAMIN/MINERAL PO) Take by mouth.   naproxen  sodium (ANAPROX  DS) 550 MG tablet Take 1 tablet (550 mg total) by mouth 2 (two) times daily with a meal.   nitroGLYCERIN  (NITROSTAT ) 0.4 MG SL tablet PLACE 1 TABLET UNDER THE TONGUE EVERY 5 MINUTES FOR 3 DOSES AS NEEDED FOR CHEST PAIN   omeprazole (PRILOSEC) 20 MG capsule Take 20 mg by mouth daily.   ticagrelor  (BRILINTA ) 60 MG TABS tablet TAKE 1 TABLET BY MOUTH TWICE DAILY.   TRULICITY 3 MG/0.5ML SOPN    vitamin B-12 (CYANOCOBALAMIN) 500 MCG tablet Take 500 mcg by mouth daily.   vitamin C (ASCORBIC ACID) 500 MG tablet Take 500 mg by mouth daily.   No current facility-administered medications for this visit. (Other)   REVIEW OF SYSTEMS:   ALLERGIES Allergies  Allergen Reactions   Contrast Media [Iodinated Contrast Media] Hives, Shortness Of Breath and Itching    Pt broke out in large hives on her face after CT scan with contrast injection. Pt became slightly more short of breath than she was already after scan as well. Pt needs pre-meds prior to future studies.    PAST MEDICAL HISTORY Past Medical History:  Diagnosis Date   CAD 08/09/2018   s/p thrombectomy  and DES to proximal RCA   History of NSTEMI 08/09/2018   99% thrombotic pRCA --> thrombectomy & DES PCI (overnight aggrastat  for distal embolization to RPAV) -  Lv Gram EF 50-55% with Inferior HK -> f/u Echo with Hyperdynamic LV, EF 65-70% & no RWMA.   Hyperlipidemia    Hypertension    Tobacco abuse    Type 2 diabetes mellitus (HCC)    Past Surgical History:  Procedure Laterality Date   ABDOMINAL HYSTERECTOMY     CESAREAN SECTION     CORONARY STENT INTERVENTION N/A 08/09/2018   Procedure:  CORONARY STENT INTERVENTION;  Surgeon: Wenona Hamilton, MD;  Location: [facility];  Service: Cardiovascular;  prox RCA 99% (thrombotic) stenosis (DES PCI with distal emobolization to rPAV; Synergy DES 3.5 x 20), -> rPAV 80% (distal embolization)   HERNIA REPAIR     LEFT HEART CATH AND CORONARY ANGIOGRAPHY N/A 08/09/2018   Procedure: LEFT HEART CATH AND CORONARY ANGIOGRAPHY;  Surgeon: Wenona Hamilton, MD;  [facility]: [facility];  prox RCA 99% (thrombotic) stenosis (DES PCI with distal emobolization to rPAV; Synergy DES), -> rPAV 80% (distal embolization). p-mCx 30%. p-m LAD 40%. EF 50-55% w/ Inferior HK.   NM MYOVIEW  LTD  07/2020   EF 55 to 60%.  No EKG changes.  No ischemia or infarction.  LOW RISK.   TRANSTHORACIC ECHOCARDIOGRAM  08/10/2018   post Inf NSTEMI --> Mild concentric LVH with vigorous function.  EF 65-70%.  No RWMA.  GR 1 DD.   FAMILY HISTORY Family History  Problem Relation Age of Onset   Hypertension Mother    Diabetes Father    Colon cancer Neg Hx    SOCIAL HISTORY Social History   Tobacco Use   Smoking status: Every Day    Current packs/day: 0.50    Types: Cigarettes   Smokeless tobacco: Never   Tobacco comments:    8 cigs a day  Vaping Use   Vaping status: Never Used  Substance Use Topics   Alcohol use: Yes    Alcohol/week: 0.0 standard drinks of alcohol    Comment: occasional   Drug use: No       OPHTHALMIC EXAM:  Not recorded    IMAGING AND PROCEDURES  Imaging and Procedures for 05/04/2024         ASSESSMENT/PLAN: No diagnosis found.  1,2. Paving stone degeneration and oral bay cysts OU  - no RT/RD/Lattice seen on scleral depressed exam OU  - BCVA 20/25 OU and pt asymptomatic -- no flashes of light or floaters  - pigmented paving stone and oral bay cysts in inferior and temporal quadrants OU  - discussed findings, prognosis  - no retinal or ophthalmic interventions indicated or recommended at this time -- monitor  - f/u 4  months, DFE, OCT  3,4. Diabetes mellitus, type 2 without retinopathy  - A1C 6.7 (07.17.24) - The incidence, risk factors for progression, natural history and treatment options for diabetic retinopathy  were discussed with patient.   - The need for close monitoring of blood glucose, blood pressure, and serum lipids, avoiding cigarette or any type of tobacco, and the need for long term follow up was also discussed with patient.  - BCVA 20/25 OD, 20/30 OS  - OCT shows no DME OU  5,6. Hypertensive retinopathy OU - discussed importance of tight BP control - monitor   6. Mixed Cataract OU (OS>OD) - The symptoms of cataract, surgical options, and treatments and risks were  discussed with patient. - discussed diagnosis and progression - monitor   Ophthalmic Meds Ordered this visit:  No orders of the defined types were placed in this encounter.    No follow-ups on file.  There are no Patient Instructions on file for this visit.  This document serves as a record of services personally performed by Jeanice Millard, MD, PhD. It was created on their behalf by Eller Gut COT, an ophthalmic technician. The creation of this record is the provider's dictation and/or activities during the visit.    Electronically signed by: Eller Gut COT 05.21.25 10:07 AM    Abbreviations: M myopia (nearsighted); A astigmatism; H hyperopia (farsighted); P presbyopia; Mrx spectacle prescription;  CTL contact lenses; OD right eye; OS left eye; OU both eyes  XT exotropia; ET esotropia; PEK punctate epithelial keratitis; PEE punctate epithelial erosions; DES dry eye syndrome; MGD meibomian gland dysfunction; ATs artificial tears; PFAT's preservative free artificial tears; NSC nuclear sclerotic cataract; PSC posterior subcapsular cataract; ERM epi-retinal membrane; PVD posterior vitreous detachment; RD retinal detachment; DM diabetes mellitus; DR diabetic retinopathy; NPDR non-proliferative diabetic  retinopathy; PDR proliferative diabetic retinopathy; CSME clinically significant macular edema; DME diabetic macular edema; dbh dot blot hemorrhages; CWS cotton wool spot; POAG primary open angle glaucoma; C/D cup-to-disc ratio; HVF humphrey visual field; GVF goldmann visual field; OCT optical coherence tomography; IOP intraocular pressure; BRVO Branch retinal vein occlusion; CRVO central retinal vein occlusion; CRAO central retinal artery occlusion; BRAO branch retinal artery occlusion; RT retinal tear; SB scleral buckle; PPV pars plana vitrectomy; VH Vitreous hemorrhage; PRP panretinal laser photocoagulation; IVK intravitreal kenalog ; VMT vitreomacular traction; MH Macular hole;  NVD neovascularization of the disc; NVE neovascularization elsewhere; AREDS age related eye disease study; ARMD age related macular degeneration; POAG primary open angle glaucoma; EBMD epithelial/anterior basement membrane dystrophy; ACIOL anterior chamber intraocular lens; IOL intraocular lens; PCIOL posterior chamber intraocular lens; Phaco/IOL phacoemulsification with intraocular lens placement; PRK photorefractive keratectomy; LASIK laser assisted in situ keratomileusis; HTN hypertension; DM diabetes mellitus; COPD chronic obstructive pulmonary disease

## 2024-05-04 ENCOUNTER — Encounter (INDEPENDENT_AMBULATORY_CARE_PROVIDER_SITE_OTHER): Admitting: Ophthalmology

## 2024-06-23 ENCOUNTER — Other Ambulatory Visit: Payer: Self-pay | Admitting: Cardiology

## 2024-06-23 ENCOUNTER — Other Ambulatory Visit: Payer: Self-pay | Admitting: Nurse Practitioner

## 2024-06-27 ENCOUNTER — Other Ambulatory Visit: Payer: Self-pay | Admitting: Nurse Practitioner

## 2024-07-02 NOTE — Telephone Encounter (Signed)
 Pt has 1 pill left. She said she told pharmacy she wants to make sure it is ok for her to take the generic. If so she is fine with the generic.

## 2024-08-06 ENCOUNTER — Other Ambulatory Visit: Payer: Self-pay | Admitting: Nurse Practitioner

## 2024-08-20 ENCOUNTER — Ambulatory Visit: Admitting: Cardiology

## 2024-08-20 ENCOUNTER — Telehealth: Payer: Self-pay | Admitting: Cardiology

## 2024-08-20 NOTE — Telephone Encounter (Signed)
 Pt c/o medication issue:  1. Name of Medication:   atorvastatin  (LIPITOR ) 40 MG tablet    2. How are you currently taking this medication (dosage and times per day)? As written   3. Are you having a reaction (difficulty breathing--STAT)? No   4. What is your medication issue? Patient said she feels sick when taking her cholesterol pills and having some heart flutters

## 2024-08-20 NOTE — Telephone Encounter (Signed)
 Patient reports she felt sick and had heart flutters when taking atorvastatin .   She stated several times she has tried to get in touch with our office but had a lot of difficulty getting through to someone. She states she called this morning to get directions to our office to come to her appt scheduled for today, but she was not able to get though. She states she thought her appt was scheduled for tomorrow.  Patient states her PCP discontinued atorvastatin  and started patient on rosuvastatin 5 mg, take 3-7 days weekly. She is currently taking every 3 days. She states her symptoms improved after stopping atorvastatin  and she is tolerating current dose of rosuvastatin well.  Confirmed appt scheduled for 08/31/24 at 1:55 PM with Rollo Louder, PA-C. Provided patient with address of office. Informed patient I will forward this to Rollo to make her aware and can discuss further at appt next week. Patient verbalized understanding and expressed appreciation for call.  Medication list updated.

## 2024-08-23 NOTE — Progress Notes (Signed)
 Cardiology Office Note   Date:  08/31/2024  ID:  Rebecca Cuevas, DOB 03/13/1961, MRN [id number] PCP: Loring Tanda Mae, MD  [facility] Providers Cardiologist:  Alm Clay, MD   History of Present Illness Rebecca Cuevas is a 63 y.o. female with a past medical history of CAD, HTN, HLD, type 2 DM, tobacco use. Patient presents today for an annual follow up appointment   Patient previously had NSTEMI in 07/2018. Cath showed severe one-vessel CAD with subtotal thrombotic occlusion of the proximal RCA with very large thrombus extending into the mid segment and evidence of embolization into the posterior AV groove. Treated with successful aspiration thrombectomy and DES placement to the proximal RCA. Aspiration thrombectomy was also performed on the posterior AV groove branch. There was residual thrombus in the distal RCA that was treated with aggrastat  overnight. Echocardiogram 08/09/18 showed EF 65-70%, no regional wall motion abnormalities, grade I DD, trivial MR.   Stress test in 07/2020 was a normal, low risk study.  Last seen 05/20/23. At that time, patient was doing well. Continued to smoke. Remained on Brilinta , lisinopril , carvedilol , lipitor .   Today, patient presents for annual appointment.  Reports that she has been doing very well from a cardiovascular standpoint.  She denies chest pain or shortness of breath.  She denies lower extremity swelling.  No dizziness, syncope, near syncope.  No palpitations.  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  She recently had her physical with her PCP.  Lab work at that time showed that her LDL was elevated to 122.  She is currently on Crestor 5 mg three times per week.  Previously did not tolerate Lipitor .  She reports that even on 5 mg of Crestor she seems to have quite a few side effects from medication.  Reports feeling dizzy and overall unwell after taking the medication.  She is open  to a lipid clinic referral to discuss alternative therapies.  She has been working to quit smoking.  Currently down to 5 cigarettes/day.   Studies Reviewed Cardiac Studies & Procedures   ______________________________________________________________________________________________ CARDIAC CATHETERIZATION  CARDIAC CATHETERIZATION 08/09/2018  Conclusion  The left ventricular systolic function is normal.  LV end diastolic pressure is normal.  The left ventricular ejection fraction is 50-55% by visual estimate.  Prox RCA lesion is 99% stenosed.  Post intervention, there is a 0% residual stenosis.  A drug-eluting stent was successfully placed using a STENT SYNERGY DES 3.5X20.  Post Atrio lesion is 80% stenosed.  Prox Cx to Mid Cx lesion is 30% stenosed.  Prox LAD to Mid LAD lesion is 40% stenosed.  1.  Severe one-vessel coronary artery disease with subtotal thrombotic occlusion of the proximal right coronary artery with very large thrombus extending into the mid segment and evidence of embolization into the posterior AV groove branch. 2.  Low normal LV systolic function with an EF of 50% with inferior wall hypokinesis.  Normal LVEDP. 3.  Successful aspiration thrombectomy and drug-eluting stent placement to the proximal right coronary artery.  Aspiration thrombectomy was also performed on the posterior AV groove branch.  Slow flow developed after stent placement which was treated with intracoronary adenosine  given distally and proximally.  There was residual thrombus in the distal RCA distribution which was left to be treated with Aggrastat  overnight.  Recommendations: Dual antiplatelet therapy for at least one year. Aggressive treatment of risk factors.  Recommend uninterrupted dual antiplatelet therapy with Aspirin   81mg  daily and Ticagrelor  90mg  twice daily for a minimum of 12 months (ACS - Class I recommendation).  Findings Coronary Findings Diagnostic  Dominance: Right  Left  Main Vessel is angiographically normal.  Left Anterior Descending Prox LAD to Mid LAD lesion is 40% stenosed.  Left Circumflex Vessel is angiographically normal. Prox Cx to Mid Cx lesion is 30% stenosed.  First Obtuse Marginal Branch Vessel is angiographically normal.  Second Obtuse Marginal Branch Vessel is angiographically normal.  Third Obtuse Marginal Branch Vessel is angiographically normal.  Right Coronary Artery Prox RCA lesion is 99% stenosed. Vessel is the culprit lesion. The lesion is type C, thrombotic and heavily thrombotic with left-to-right collateral flow.  Right Posterior Atrioventricular Artery Post Atrio lesion is 80% stenosed.  Intervention  Prox RCA lesion Thrombectomy WIRE RUNTHROUGH .[id number] guidewire was used to cross lesion. Aspiration thrombectomy performed using a CATH PRONTO V4 5.43F. 4 passes taken. Stent Pre-stent angioplasty was performed using a BALLOON EMERGE MR 2.5X12. Maximum pressure:  12 atm. Inflation time:  30 sec. A drug-eluting stent was successfully placed using a STENT SYNERGY DES 3.5X20. Maximum pressure: 14 atm. Inflation time: 20 sec. Post-stent angioplasty was performed using a BALLOON [place] EMERGE MR 4.0X15. Maximum pressure:  12 atm. Inflation time:  15 sec. Post-Intervention Lesion Assessment The intervention was successful. Pre-interventional TIMI flow is 1. Post-intervention TIMI flow is 3. Treated lesion length:  18 mm. No-reflow occurred during the intervention. IC adenosine  was given. There is a 0% residual stenosis post intervention.   STRESS TESTS  MYOCARDIAL PERFUSION IMAGING 08/01/2020  Interpretation Summary  Nuclear stress EF: 57%.  No T wave inversion was noted during stress.  There was no ST segment deviation noted during stress.  This is a low risk study.  Normal perfusion. LVEF 57% with normal wall motion. This is a low risk study.   ECHOCARDIOGRAM  ECHOCARDIOGRAM COMPLETE 08/09/2018  Narrative *[facility]* *[facility]* [address] [place], [place] [address] [phone]  ------------------------------------------------------------------- Transthoracic Echocardiography  Patient:    Rmani, Kapusta MR #:       [id number] Study Date: 08/09/2018 Gender:     F Age:        37 Height:     167.6 cm Weight:     90.1 kg BSA:        2.08 m^2 Pt. Status: Room:       [facility]  SONOGRAPHER  Lyle Marc, RCS PERFORMING   [facility], Inpatient ADMITTING    Seth Roma BRAVO ATTENDING    [place], Jedrek E ORDERING     [place], Jedrek E REFERRING    [place], Jedrek E  cc:  ------------------------------------------------------------------- LV EF: 65% -   70%  ------------------------------------------------------------------- Indications:      CAD of native vessels 414.01.  ------------------------------------------------------------------- History:   PMH:   Acute myocardial infarction.  Risk factors: Current tobacco use. Hypertension. Diabetes mellitus.  ------------------------------------------------------------------- Study Conclusions  - Left ventricle: The cavity size was normal. There was mild concentric hypertrophy. Systolic function was vigorous. The estimated ejection fraction was in the range of 65% to 70%. Wall motion was normal; there were no regional wall motion abnormalities. There was an increased relative contribution of atrial contraction to ventricular filling. Doppler parameters are consistent with abnormal left ventricular relaxation (grade 1 diastolic dysfunction). - Mitral valve: There was trivial regurgitation.  ------------------------------------------------------------------- Labs, prior tests, procedures, and surgery: ECG.     Abnormal.  ------------------------------------------------------------------- Study data:  No prior study was available for comparison.  Study status:  Routine.  Procedure:  The patient reported no pain pre  or post test. Transthoracic echocardiography. Image quality was adequate.  Study completion:  There were no complications. Transthoracic echocardiography.  M-mode, complete 2D, spectral Doppler, and color Doppler.  Birthdate:  Patient birthdate: 10/09/1961.  Age:  Patient is 63 yr old.  Sex:  Gender: female. BMI: 32.1 kg/m^2.  Blood pressure:     122/86  Patient status: Inpatient.  Study date:  Study date: 08/09/2018. Study time: 02:28 PM.  Location:  Bedside.  -------------------------------------------------------------------  ------------------------------------------------------------------- Left ventricle:  The cavity size was normal. There was mild concentric hypertrophy. Systolic function was vigorous. The estimated ejection fraction was in the range of 65% to 70%. Wall motion was normal; there were no regional wall motion abnormalities. There was an increased relative contribution of atrial contraction to ventricular filling. Doppler parameters are consistent with abnormal left ventricular relaxation (grade 1 diastolic dysfunction).  ------------------------------------------------------------------- Aortic valve:   Trileaflet; normal thickness, mildly calcified leaflets. Mobility was not restricted.  Doppler:  Transvalvular velocity was within the normal range. There was no stenosis. There was no regurgitation.  ------------------------------------------------------------------- Aorta:  Aortic root: The aortic root was normal in size.  ------------------------------------------------------------------- Mitral valve:   Structurally normal valve.   Mobility was not restricted.  Doppler:  Transvalvular velocity was within the normal range. There was no evidence for stenosis. There was trivial regurgitation.    Valve area by pressure half-time: 4.89 cm^2. Indexed valve area by pressure half-time: 2.35 cm^2/m^2.    Peak gradient (D): 2 mm  Hg.  ------------------------------------------------------------------- Left atrium:  The atrium was normal in size.  ------------------------------------------------------------------- Right ventricle:  The cavity size was normal. Wall thickness was normal. Systolic function was normal.  ------------------------------------------------------------------- Pulmonic valve:    Structurally normal valve.   Cusp separation was normal.  Doppler:  Transvalvular velocity was within the normal range. There was no evidence for stenosis. There was no regurgitation.  ------------------------------------------------------------------- Tricuspid valve:   Structurally normal valve.    Doppler: Transvalvular velocity was within the normal range. There was no regurgitation.  ------------------------------------------------------------------- Pulmonary artery:   The main pulmonary artery was normal-sized. Systolic pressure could not be accurately estimated.  ------------------------------------------------------------------- Right atrium:  The atrium was normal in size.  ------------------------------------------------------------------- Pericardium:  There was no pericardial effusion.  ------------------------------------------------------------------- Systemic veins: Inferior vena cava: The vessel was normal in size.  ------------------------------------------------------------------- Measurements  Left ventricle                           Value          Reference LV ID, ED, PLAX chordal                  43    mm       43 - 52 LV ID, ES, PLAX chordal                  29    mm       23 - 38 LV fx shortening, PLAX chordal           33    %        >=29 LV PW thickness, ED                      11    mm       --------- IVS/LV PW ratio, ED  1              <=1.3 Stroke volume, 2D                        67    ml       --------- Stroke volume/bsa, 2D                    32     ml/m^2   --------- LV e&', lateral                           8.05  cm/s     --------- LV E/e&', lateral                         8.92           --------- LV e&', medial                            4.57  cm/s     --------- LV E/e&', medial                          15.71          --------- LV e&', average                           6.31  cm/s     --------- LV E/e&', average                         11.38          ---------  Ventricular septum                       Value          Reference IVS thickness, ED                        11    mm       ---------  LVOT                                     Value          Reference LVOT ID, S                               21    mm       --------- LVOT area                                3.46  cm^2     --------- LVOT peak velocity, S                    117   cm/s     --------- LVOT mean velocity, S                    77.8  cm/s     --------- LVOT VTI, S  19.3  cm       --------- LVOT peak gradient, S                    5     mm Hg    ---------  Aorta                                    Value          Reference Aortic root ID, ED                       30    mm       ---------  Left atrium                              Value          Reference LA ID, A-P, ES                           31    mm       --------- LA ID/bsa, A-P                           1.49  cm/m^2   <=2.2 LA volume, S                             34    ml       --------- LA volume/bsa, S                         16.3  ml/m^2   --------- LA volume, ES, 1-p A4C                   36.9  ml       --------- LA volume/bsa, ES, 1-p A4C               17.7  ml/m^2   --------- LA volume, ES, 1-p A2C                   26.8  ml       --------- LA volume/bsa, ES, 1-p A2C               12.9  ml/m^2   ---------  Mitral valve                             Value          Reference Mitral E-wave peak velocity              71.8  cm/s     --------- Mitral A-wave peak velocity               108   cm/s     --------- Mitral deceleration time                 155   ms       150 - 230 Mitral pressure half-time                45    ms       --------- Mitral peak  gradient, D                  2     mm Hg    --------- Mitral E/A ratio, peak                   0.7            --------- Mitral valve area, PHT, DP               4.89  cm^2     --------- Mitral valve area/bsa, PHT, DP           2.35  cm^2/m^2 ---------  Right atrium                             Value          Reference RA ID, S-I, ES, A4C                      45.7  mm       34 - 49 RA area, ES, A4C                         15.1  cm^2     8.3 - 19.5 RA volume, ES, A/L                       40    ml       --------- RA volume/bsa, ES, A/L                   19.2  ml/m^2   ---------  Right ventricle                          Value          Reference RV ID, minor axis, ED, A4C               20    mm       --------- base TAPSE                                    19.9  mm       --------- RV s&', lateral, S                        15.5  cm/s     ---------  Legend: (L)  and  (H)  mark values outside specified reference range.  ------------------------------------------------------------------- Prepared and Electronically Authenticated by  Wilbert Bihari, MD 2019-08-28T15:34:01          ______________________________________________________________________________________________      Risk Assessment/Calculations           Physical Exam VS:  BP 136/70   Pulse 72   Ht 5' 6 (1.676 m)   Wt 199 lb 9.6 oz (90.5 kg)   SpO2 99%   BMI 32.22 kg/m        Wt Readings from Last 3 Encounters:  08/31/24 199 lb 9.6 oz (90.5 kg)  10/20/23 203 lb (92.1 kg)  05/20/23 207 lb 9.6 oz (94.2 kg)    GEN: Well nourished, well developed in no acute distress. Sitting comfortably in the chair  NECK: No JVD  CARDIAC:  RRR, no murmurs, rubs, gallops.  Radial pulses 2+ bilaterally  RESPIRATORY:  Clear to auscultation without rales, wheezing  or rhonchi. Normal WOB on room air   ABDOMEN: Soft, non-tender, non-distended EXTREMITIES:  No edema in BLE; No deformity   ASSESSMENT AND PLAN  CAD  - Previously had NSTEMI in 07/2018 and underwent thrombectomy/DES to prox RCA and thrombectomy of the posterior AV grrove  - Patient denies chest pain or shortness of breath - EKG today shows NSR, no ischemic changes  - Continue Brilinta  60 mg BID - Continue crestor 5 mg 3x per week for now- she has not been tolerating statin therapy and her LDL is not at goal. Referring to lipid clinic  - Continue carvedilol  25 mg BID  HTN  - BP well controlled  - Continue carvedilol  25 mg BID, lisinopril  10 mg daily - K 4.1 and creatinine 0.99 on 8/15   HLD  - Lipid panel from 8/15 showed LDL 122, HDL 68, triglycerides 88, total cholesterol 206  - continue crestor 5 mg three times per week for now. Did not tolerate lipitor  40 mg daily due to dizziness. Reports that on the days she takes crestor 5 mg, she feels dizzy and malaise  - Referred to lipid clinic to discuss repatha    Tobacco use  - Patient has cut back to 5-6 cigarettes per day  - Counseled on tobacco cessation   Dispo: Follow up with Dr. Anner in 1 year   Signed, Rollo FABIENE Louder, PA-C

## 2024-08-31 ENCOUNTER — Other Ambulatory Visit: Payer: Self-pay | Admitting: Family Medicine

## 2024-08-31 ENCOUNTER — Encounter: Payer: Self-pay | Admitting: Cardiology

## 2024-08-31 ENCOUNTER — Ambulatory Visit
Admission: RE | Admit: 2024-08-31 | Discharge: 2024-08-31 | Disposition: A | Discharge status: Home / Self Care | Source: Ambulatory Visit | Attending: Family Medicine | Admitting: Family Medicine

## 2024-08-31 ENCOUNTER — Ambulatory Visit: Attending: Cardiology | Admitting: Cardiology

## 2024-08-31 VITALS — BP 136/70 | HR 72 | Ht 66.0 in | Wt 199.6 lb

## 2024-08-31 DIAGNOSIS — Z1231 Encounter for screening mammogram for malignant neoplasm of breast: Secondary | ICD-10-CM

## 2024-08-31 DIAGNOSIS — I2510 Atherosclerotic heart disease of native coronary artery without angina pectoris: Secondary | ICD-10-CM

## 2024-08-31 DIAGNOSIS — I10 Essential (primary) hypertension: Secondary | ICD-10-CM

## 2024-08-31 DIAGNOSIS — E785 Hyperlipidemia, unspecified: Secondary | ICD-10-CM

## 2024-08-31 DIAGNOSIS — Z720 Tobacco use: Secondary | ICD-10-CM

## 2024-08-31 NOTE — Patient Instructions (Addendum)
 Medication Instructions:  Your physician recommends that you continue on your current medications as directed. Please refer to the Current Medication list given to you today.  *If you need a refill on your cardiac medications before your next appointment, please call your pharmacy*   Follow-Up: At [facility], you and your health needs are our priority.  As part of our continuing mission to provide you with exceptional heart care, our providers are all part of one team.  This team includes your primary Cardiologist (physician) and Advanced Practice Providers or APPs (Physician Assistants and Nurse Practitioners) who all work together to provide you with the care you need, when you need it.  Your next appointment:   1 year(s)  Provider:   Alm Clay, MD    We recommend signing up for the patient portal called MyChart.  Sign up information is provided on this After Visit Summary.  MyChart is used to connect with patients for Virtual Visits (Telemedicine).  Patients are able to view lab/test results, encounter notes, upcoming appointments, etc.  Non-urgent messages can be sent to your provider as well.   To learn more about what you can do with MyChart, go to [email or web address].   Other Instructions Referral sent to Lipid Clinic

## 2024-09-02 ENCOUNTER — Other Ambulatory Visit: Payer: Self-pay | Admitting: Nurse Practitioner

## 2024-09-05 ENCOUNTER — Other Ambulatory Visit: Payer: Self-pay | Admitting: Family Medicine

## 2024-09-05 DIAGNOSIS — R928 Other abnormal and inconclusive findings on diagnostic imaging of breast: Secondary | ICD-10-CM

## 2024-09-11 ENCOUNTER — Other Ambulatory Visit: Payer: Self-pay | Admitting: Family Medicine

## 2024-09-11 ENCOUNTER — Ambulatory Visit
Admission: RE | Admit: 2024-09-11 | Discharge: 2024-09-11 | Disposition: A | Discharge status: Home / Self Care | Source: Ambulatory Visit | Attending: Family Medicine | Admitting: Family Medicine

## 2024-09-11 DIAGNOSIS — R928 Other abnormal and inconclusive findings on diagnostic imaging of breast: Secondary | ICD-10-CM

## 2024-09-17 ENCOUNTER — Ambulatory Visit
Admission: RE | Admit: 2024-09-17 | Discharge: 2024-09-17 | Disposition: A | Discharge status: Home / Self Care | Source: Ambulatory Visit | Attending: Family Medicine | Admitting: Family Medicine

## 2024-09-17 ENCOUNTER — Ambulatory Visit
Admission: RE | Admit: 2024-09-17 | Discharge: 2024-09-17 | Disposition: A | Source: Ambulatory Visit | Attending: Family Medicine | Admitting: Family Medicine

## 2024-09-17 DIAGNOSIS — R928 Other abnormal and inconclusive findings on diagnostic imaging of breast: Secondary | ICD-10-CM

## 2024-09-17 HISTORY — PX: BREAST BIOPSY: SHX20

## 2024-09-19 LAB — SURGICAL PATHOLOGY

## 2024-11-16 ENCOUNTER — Other Ambulatory Visit (HOSPITAL_COMMUNITY): Payer: Self-pay

## 2024-11-16 ENCOUNTER — Telehealth: Payer: Self-pay | Admitting: Cardiology

## 2024-11-16 MED ORDER — TICAGRELOR 60 MG PO TABS
60.0000 mg | ORAL_TABLET | Freq: Two times a day (BID) | ORAL | 3 refills | Status: AC
  Filled 2024-11-16: qty 180, 90d supply, fill #0

## 2024-11-16 NOTE — Telephone Encounter (Signed)
 Spoke with patient, she states her pharmacy and local pharmacies are out of stock on ticagrelor . She took her last pill this morning.  I checked with our pharmacy team and was told they have ticagrelor  in stock. Informed patient and sent 90 day supply to pharmacy at [facility]. Patient states she will come in to pickup RX this afternoon and expressed appreciation for assistance.

## 2024-11-16 NOTE — Telephone Encounter (Signed)
 Patient called again regarding her ticagrelor  (BRILINTA ) 60 MG TABS tablet medication and stated she took her last tablet this morning and no pharmacies are carrying the generic version of this medication and her insurance will not pay for the brand medication.  Patient wants to get alternate medication.

## 2024-11-16 NOTE — Telephone Encounter (Signed)
 Pt returned call - please advise

## 2024-11-16 NOTE — Telephone Encounter (Signed)
 Thank you -usually our pharmacy should have a stock.  Alm Clay, MD

## 2024-11-16 NOTE — Telephone Encounter (Signed)
 Pt c/o medication issue:  1. Name of Medication:   ticagrelor  (BRILINTA ) 60 MG TABS tablet    2. How are you currently taking this medication (dosage and times per day)? As written   3. Are you having a reaction (difficulty breathing--STAT)? No   4. What is your medication issue? Pt called in stating her pharmacy does not have this med in stock and they called around to local pharmacies, they do not have any either. Please advise what she can do.

## 2025-01-04 LAB — LAB REPORT - SCANNED
A1c: 5.9
EGFR: 64
